# Patient Record
Sex: Male | Born: 1955 | Race: White | Hispanic: No | Smoking: Former smoker
Health system: Southern US, Community
[De-identification: ages and names within clinical notes are randomized; demographics above are authoritative.]

## PROBLEM LIST (undated history)

## (undated) DIAGNOSIS — I2510 Atherosclerotic heart disease of native coronary artery without angina pectoris: Secondary | ICD-10-CM

## (undated) DIAGNOSIS — E875 Hyperkalemia: Secondary | ICD-10-CM

## (undated) DIAGNOSIS — E7800 Pure hypercholesterolemia, unspecified: Secondary | ICD-10-CM

## (undated) DIAGNOSIS — H269 Unspecified cataract: Secondary | ICD-10-CM

## (undated) DIAGNOSIS — L409 Psoriasis, unspecified: Secondary | ICD-10-CM

## (undated) DIAGNOSIS — I10 Essential (primary) hypertension: Secondary | ICD-10-CM

## (undated) DIAGNOSIS — K219 Gastro-esophageal reflux disease without esophagitis: Secondary | ICD-10-CM

## (undated) HISTORY — DX: Hyperkalemia: E87.5

## (undated) HISTORY — DX: Unspecified cataract: H26.9

## (undated) HISTORY — PX: NO PAST SURGERIES: SHX2092

## (undated) HISTORY — PX: WISDOM TOOTH EXTRACTION: SHX21

## (undated) HISTORY — DX: Psoriasis, unspecified: L40.9

## (undated) HISTORY — DX: Gastro-esophageal reflux disease without esophagitis: K21.9

---

## 2002-02-15 HISTORY — PX: COLONOSCOPY: SHX174

## 2008-02-23 ENCOUNTER — Ambulatory Visit (HOSPITAL_COMMUNITY): Admission: RE | Admit: 2008-02-23 | Discharge: 2008-02-23 | Payer: Self-pay | Admitting: Family Medicine

## 2008-03-05 ENCOUNTER — Ambulatory Visit: Payer: Self-pay | Admitting: Cardiology

## 2008-03-13 ENCOUNTER — Encounter: Payer: Self-pay | Admitting: Cardiology

## 2008-03-13 ENCOUNTER — Ambulatory Visit: Payer: Self-pay

## 2008-04-08 ENCOUNTER — Ambulatory Visit: Payer: Self-pay | Admitting: Cardiology

## 2008-06-20 ENCOUNTER — Telehealth: Payer: Self-pay | Admitting: Cardiology

## 2008-06-20 ENCOUNTER — Encounter: Payer: Self-pay | Admitting: Cardiology

## 2008-06-20 DIAGNOSIS — I719 Aortic aneurysm of unspecified site, without rupture: Secondary | ICD-10-CM | POA: Insufficient documentation

## 2008-06-24 ENCOUNTER — Encounter: Admission: RE | Admit: 2008-06-24 | Discharge: 2008-06-24 | Payer: Self-pay | Admitting: Cardiology

## 2010-02-15 HISTORY — PX: RETINAL DETACHMENT SURGERY: SHX105

## 2010-03-19 NOTE — Medication Information (Signed)
Summary: Physician's Orders   Physician's Orders   Imported By: Roderic Ovens 09/02/2009 12:11:16  _____________________________________________________________________  External Attachment:    Type:   Image     Comment:   External Document

## 2010-06-30 NOTE — Assessment & Plan Note (Signed)
[facility]                            CARDIOLOGY OFFICE NOTE   NAME:Roger Powers, Roger Powers                          MRN:          [id number]  DATE:04/08/2008                            DOB:          1955/02/25    I had Mr. Barich come in for a followup today.  We reviewed his  echocardiogram.  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  As a result, he may need a CTA, but I will look and  see with the radiologists as to whether or not this really needs to be  done.  Whether or not they saw enough during this was of question.  In  addition, the patient has mild hypertrophy of focal basal septum.  It  measures 1.9 and the mid septum measures 1.1.  I told him prior to  making any recommendations I would speak with Dr. Nolon Rod at [facility].   Overall, the patient looks good.  His blood pressure is 128/70, pulse is  80.  He does not have a significant murmur noted.  There is no  significant edema.   RECOMMENDATIONS:  1. Probably MRI/MRA or CTA of the thoracic aorta for better      delineation.  2. Review with Dr. Nolon Rod at [facility], need for further GI studies.     Arturo Morton. Riley Kill, MD, [facility]  Electronically Signed    TDS/MedQ  DD: 04/28/2008  DT: 04/29/2008  Job #: [id number]   cc:   Mila Homer. Sudie Bailey, M.D.

## 2010-06-30 NOTE — Assessment & Plan Note (Signed)
Roger Powers                            CARDIOLOGY OFFICE NOTE   NAME:Roger Powers, Roger Powers                          MRN:          [id number]  DATE:03/05/2008                            DOB:          1956-01-04    CHIEF COMPLAINT:  Abnormal CT scan.   HISTORY OF PRESENT ILLNESS:  Roger Powers is a very delightful 55 year old  gentleman, who has been referred for further evaluation.  He is  basically asymptomatic from a cardiac standpoint.  He had some right  flank pain and underwent CT study on February 23, 2008, read by Dr.  Molli Posey.  To summarize, this demonstrated no acute findings in the  abdominal pelvis, no distal urinary stones, and left inguinal hernia  containing only fat.  On one image, there was asymmetric thickening of  the septum myocardium towards the base.  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  Sudie Bailey.  In reviewing the patient's history, he has been asymptomatic  from a cardiac standpoint.  He does pretty much whatever he would like  to do.  For insurance purposes, he had an enhanced stress test  approximately 2 years ago and I believe this may have been done  [facility].  He also had other studies done at an  office.  It sounds like it could have been Dr. Roseanne Kaufman office.  The  patient has not had chest pain, syncope, or presyncope.  He has not had  a clear-cut history of early sudden death.  He provides no history of  significant murmur in the past.   PAST MEDICAL HISTORY:  1. History of mild obesity.  2. History of mild hypercholesterolemia.  3. Reported history of psoriasis.   CURRENT MEDICATIONS:  1. Enalapril 10 mg daily for hypertension.  2. Aspirin 81 mg daily.   ALLERGIES:  No known drug allergies.   FAMILY HISTORY:  His father died at 77.  He had had CABG, PTCA, and a  pacemaker.  His great-great-grandfather and grandfather both died at 26  and  64 of heart disease.  One of them was a smoker and has had a CVA.  His mother died of breast cancer.  He has had a grandmother with cancer  of the brain and a grandfather who had a myocardial infarction.   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  He quit smoking approximately 28 years ago.  He did  this because of his 80 year old son.  He has another son, Roger Powers, who has  hydrocephalous at age 28 and sees Dr. Sharene Skeans.  He has a 30 year old  daughter as well.  He has a brother who is 50 in [place] and in good  health.  He has a son who died.  His nephew has testicular cancer.  He  has a 21 year old brother who had chest pain and died presumably from a  cardiac event.   REVIEW OF SYSTEMS:  His GI is negative.  He did have some flank pain.  HEENT:  Ear, nose, and throat, musculoskeletal review of systems are all  unremarkable.  A complete review of systems is unremarkable.   PHYSICAL EXAMINATION:  GENERAL:  He is alert and oriented in no  distress, weight is 215 pounds, blood pressure 148/100 equal in both  arms, pulse is 84.  NECK:  Carotid upstrokes are brisk without bruit.  LUNGS:  Fields are clear to auscultation and percussion.  The PMI is  nondisplaced.  CARDIAC:  There is normal first and second heart sound.  I did not  appreciate a significant murmur.  We squaded the patient and stood him  back up and did not hear any appreciation of a murmur at this point in  time.  ABDOMEN:  Soft without obvious hepatosplenomegaly.  There were no spider  angiomata.  EXTREMITIES:  Distal extremities were intact without difficult findings.   IMPRESSION:  1. 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  2. No evidence of significant obstruction by examination.   ADDENDUM:  EKG is entirely within normal limits.  PR interval is 142  milliseconds, QRS duration 110, QTC 427 milliseconds.   PLAN:  1. A 2-D echocardiogram will be  obtained to further evaluate this in      detail.  2. If 2-D echocardiogram does suggest significant thickening of the      interventricular septum, then we would recommend an exercise      tolerance test.  3. We will try to get his records from [facility]      to assess his exercise test response.  4. We will return to clinic after these findings.  5. We discussed in detail the genetics, implications, and further      actions required if hypertrophic cardiomyopathy is found.  6. I recommended against extremes of exercise at this point in time      until his echocardiogram is complete.     Arturo Morton. Riley Kill, MD, [facility]  Electronically Signed    TDS/MedQ  DD: 03/06/2008  DT: 03/06/2008  Job #: [id number]   cc:   Mila Homer. Sudie Bailey, M.D.

## 2011-02-16 HISTORY — PX: CATARACT EXTRACTION W/ INTRAOCULAR LENS IMPLANT: SHX1309

## 2011-03-19 ENCOUNTER — Ambulatory Visit: Payer: Self-pay | Admitting: Cardiology

## 2011-04-21 ENCOUNTER — Ambulatory Visit: Payer: Self-pay | Admitting: Cardiology

## 2011-05-07 ENCOUNTER — Ambulatory Visit (HOSPITAL_COMMUNITY): Payer: BC Managed Care – PPO | Attending: Internal Medicine

## 2011-05-07 ENCOUNTER — Other Ambulatory Visit: Payer: Self-pay

## 2011-05-07 ENCOUNTER — Ambulatory Visit (INDEPENDENT_AMBULATORY_CARE_PROVIDER_SITE_OTHER): Payer: Self-pay | Admitting: Cardiology

## 2011-05-07 ENCOUNTER — Encounter: Payer: Self-pay | Admitting: Cardiology

## 2011-05-07 VITALS — BP 160/90 | HR 61 | Ht 71.0 in | Wt 201.0 lb

## 2011-05-07 DIAGNOSIS — I10 Essential (primary) hypertension: Secondary | ICD-10-CM

## 2011-05-07 DIAGNOSIS — I77819 Aortic ectasia, unspecified site: Secondary | ICD-10-CM

## 2011-05-07 DIAGNOSIS — I77810 Thoracic aortic ectasia: Secondary | ICD-10-CM

## 2011-05-07 DIAGNOSIS — R931 Abnormal findings on diagnostic imaging of heart and coronary circulation: Secondary | ICD-10-CM

## 2011-05-07 DIAGNOSIS — R9389 Abnormal findings on diagnostic imaging of other specified body structures: Secondary | ICD-10-CM

## 2011-05-07 DIAGNOSIS — I7789 Other specified disorders of arteries and arterioles: Secondary | ICD-10-CM

## 2011-05-07 NOTE — Patient Instructions (Signed)
Your physician has requested that you have an echocardiogram. Echocardiography is a painless test that uses sound waves to create images of your heart. It provides your doctor with information about the size and shape of your heart and how well your heart's chambers and valves are working. This procedure takes approximately one hour. There are no restrictions for this procedure.  Your physician recommends that you continue on your current medications as directed. Please refer to the Current Medication list given to you today.  

## 2011-05-10 ENCOUNTER — Encounter (HOSPITAL_COMMUNITY): Payer: Self-pay | Admitting: Cardiology

## 2011-05-20 ENCOUNTER — Telehealth: Payer: Self-pay | Admitting: Cardiology

## 2011-05-20 NOTE — Telephone Encounter (Signed)
Dr Riley Kill aware pt called and will contact pt to discuss echo results.

## 2011-05-20 NOTE — Telephone Encounter (Signed)
Pt had test done 05-07-11 calling for results

## 2011-05-22 NOTE — Progress Notes (Signed)
   HPI:  He is generally getting along well.  It has been some time since I saw him last.  He had upper septal thickening, and no other phenotypic expression to suggest HCM.  He gets along well.  No major problems.  Denies chest pain, or other symptoms.  On his last evaluation, he had an echo and an MRA to assess his aorta because there was mild dilatation.  His MRA suggested an aortic root in the range of 4.1 cm.  There was a statistically benign liver lesion.  We reviewed his family history today----his father died at 47 and had CABG and a pacemaker.  But there is no family his of SCD importantly.    Current Outpatient Prescriptions  Medication Sig Dispense Refill  . aspirin 81 MG tablet Take 81 mg by mouth daily.      . diazepam (VALIUM) 10 MG tablet Take 10 mg by mouth as needed.       . enalapril (VASOTEC) 10 MG tablet Take 10 mg by mouth daily.      Marland Kitchen zolpidem (AMBIEN) 10 MG tablet Take 10 mg by mouth as needed.         No Known Allergies  No past medical history on file.  No past surgical history on file.  No family history on file.  History   Social History  . Marital Status: Married    Spouse Name: N/A    Number of Children: N/A  . Years of Education: N/A   Occupational History  . Not on file.   Social History Main Topics  . Smoking status: Former Smoker    Quit date: 11/18/1979  . Smokeless tobacco: Not on file  . Alcohol Use: Not on file  . Drug Use: Not on file  . Sexually Active: Not on file   Other Topics Concern  . Not on file   Social History Narrative  . No narrative on file    ROS: Please see the HPI.  All other systems reviewed and negative.  PHYSICAL EXAM:  BP 160/90  Pulse 61  Ht 5\' 11"  (1.803 m)  Wt 201 lb (91.173 kg)  BMI 28.03 kg/m2  General: Well developed, well nourished, in no acute distress. Head:  Normocephalic and atraumatic. Neck: no JVD Lungs: Clear to auscultation and percussion. Heart: Normal S1 and S2.  No murmur, rubs or  gallops.  Abdomen:  Normal bowel sounds; soft; non tender; no organomegaly Pulses: Pulses normal in all 4 extremities. Extremities: No clubbing or cyanosis. No edema. Neurologic: Alert and oriented x 3.  EKG:  NSR.  Incomplete RBBB.  No acute changes.    ASSESSMENT AND PLAN:

## 2011-05-23 DIAGNOSIS — I7789 Other specified disorders of arteries and arterioles: Secondary | ICD-10-CM | POA: Insufficient documentation

## 2011-05-23 DIAGNOSIS — I10 Essential (primary) hypertension: Secondary | ICD-10-CM | POA: Insufficient documentation

## 2011-05-23 DIAGNOSIS — R931 Abnormal findings on diagnostic imaging of heart and coronary circulation: Secondary | ICD-10-CM | POA: Insufficient documentation

## 2011-05-23 NOTE — Assessment & Plan Note (Signed)
Was not significant enlarged.  Will repeat echocardiogram.

## 2011-05-23 NOTE — Assessment & Plan Note (Signed)
He and I reviewed the meaning by DST.  It is at most moderate, and there is no significant obstruction.  It is unchanged, and does not clearly qualify as HCM  (prob less than 10% genotypic expression--case previously discussed with Dr. Regino Schultze).  No significant murmur or symptoms.  Would simply repeat echo and compare to prior study.  No further evaluation at this time.

## 2011-05-23 NOTE — Assessment & Plan Note (Signed)
Noted on physical exam and also today in office.  Would benefit from weight loss, then reassessment of his situation with Dr. Sudie Bailey.  May need treatment.

## 2011-05-27 NOTE — Progress Notes (Signed)
Addended by: Reine Just on: 05/27/2011 09:58 AM   Modules accepted: Orders

## 2012-03-21 ENCOUNTER — Telehealth: Payer: Self-pay | Admitting: Cardiology

## 2012-03-21 NOTE — Telephone Encounter (Signed)
Spoke with christina at dr Viviann Spare knowlton's office. The pt was seen in their office today and had told them dr Riley Kill had found a problem with his liver. They requested the last office note and echo faxed to [phone]. Requested records faxed to the number provided.

## 2012-03-21 NOTE — Telephone Encounter (Signed)
New problem:    Patient stating he has a liver problem. Office calling to discuss.

## 2012-04-04 ENCOUNTER — Other Ambulatory Visit (HOSPITAL_COMMUNITY): Payer: Self-pay | Admitting: Family Medicine

## 2012-04-04 ENCOUNTER — Ambulatory Visit (HOSPITAL_COMMUNITY)
Admission: RE | Admit: 2012-04-04 | Discharge: 2012-04-04 | Disposition: A | Discharge status: Home / Self Care | Payer: BC Managed Care – PPO | Source: Ambulatory Visit | Attending: Family Medicine | Admitting: Family Medicine

## 2012-04-04 DIAGNOSIS — R0781 Pleurodynia: Secondary | ICD-10-CM

## 2012-04-04 DIAGNOSIS — R079 Chest pain, unspecified: Secondary | ICD-10-CM | POA: Insufficient documentation

## 2013-08-25 ENCOUNTER — Encounter (HOSPITAL_COMMUNITY): Payer: Self-pay | Admitting: Emergency Medicine

## 2013-08-25 ENCOUNTER — Emergency Department (HOSPITAL_COMMUNITY)
Admission: EM | Admit: 2013-08-25 | Discharge: 2013-08-25 | Disposition: A | Discharge status: Home / Self Care | Payer: BC Managed Care – PPO | Attending: Emergency Medicine | Admitting: Emergency Medicine

## 2013-08-25 DIAGNOSIS — Z79899 Other long term (current) drug therapy: Secondary | ICD-10-CM | POA: Insufficient documentation

## 2013-08-25 DIAGNOSIS — S058X9A Other injuries of unspecified eye and orbit, initial encounter: Secondary | ICD-10-CM | POA: Insufficient documentation

## 2013-08-25 DIAGNOSIS — S0502XA Injury of conjunctiva and corneal abrasion without foreign body, left eye, initial encounter: Secondary | ICD-10-CM

## 2013-08-25 DIAGNOSIS — T1590XA Foreign body on external eye, part unspecified, unspecified eye, initial encounter: Secondary | ICD-10-CM | POA: Insufficient documentation

## 2013-08-25 DIAGNOSIS — Z87891 Personal history of nicotine dependence: Secondary | ICD-10-CM | POA: Insufficient documentation

## 2013-08-25 DIAGNOSIS — T1592XA Foreign body on external eye, part unspecified, left eye, initial encounter: Secondary | ICD-10-CM

## 2013-08-25 DIAGNOSIS — Z7982 Long term (current) use of aspirin: Secondary | ICD-10-CM | POA: Insufficient documentation

## 2013-08-25 DIAGNOSIS — Y939 Activity, unspecified: Secondary | ICD-10-CM | POA: Insufficient documentation

## 2013-08-25 DIAGNOSIS — Y929 Unspecified place or not applicable: Secondary | ICD-10-CM | POA: Insufficient documentation

## 2013-08-25 DIAGNOSIS — T1500XA Foreign body in cornea, unspecified eye, initial encounter: Secondary | ICD-10-CM | POA: Insufficient documentation

## 2013-08-25 MED ORDER — TETRACAINE HCL 0.5 % OP SOLN
1.0000 [drp] | Freq: Once | OPHTHALMIC | Status: AC
  Administered 2013-08-25: 1 [drp] via OPHTHALMIC
  Filled 2013-08-25: qty 2

## 2013-08-25 MED ORDER — FLUORESCEIN SODIUM 1 MG OP STRP
1.0000 | ORAL_STRIP | Freq: Once | OPHTHALMIC | Status: AC
  Administered 2013-08-25: 1 via OPHTHALMIC
  Filled 2013-08-25: qty 1

## 2013-08-25 MED ORDER — HYDROCODONE-ACETAMINOPHEN 5-325 MG PO TABS: 1.0000 | ORAL_TABLET | ORAL | Status: DC | PRN

## 2013-08-25 MED ORDER — CIPROFLOXACIN HCL 0.3 % OP SOLN: 1.0000 [drp] | Freq: Four times a day (QID) | OPHTHALMIC | Status: DC

## 2013-08-25 NOTE — ED Notes (Signed)
Declined W/C at D/C and was escorted to lobby by RN. 

## 2013-08-25 NOTE — ED Notes (Signed)
Reports it feels like object is in left eye since last night. Having redness and pain. No vision changes.

## 2013-08-25 NOTE — ED Provider Notes (Signed)
CSN: [id number]     Arrival date & time 08/25/13  1449 History  This chart was scribed for non-physician practitioner working with Roger Essex, MD by Mercy Moore, ED Scribe. This patient was seen in room TR04C/TR04C and the patient's care was started at 4:08 PM.   Chief Complaint  Patient presents with  . Eye Pain      The history is provided by the patient. No language interpreter was used.   HPI Comments: Roger Powers is a 58 y.o. male who presents to the Emergency Department complaining of worsening left eye pain, onset last night while sleeping. Patient describes sensation of foreign body. Thus he has treated eye with saline and flushing, but denies relief. Patient reports that his pain is exacerbated with blinking; patient states that it feels as if a razor blade is in his eye. Patient denies discharge and blurred vision, but reports tearing and some consequential rhinorrhea. Patient works in [employer]. 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 Patient denies recent chemical exposures. Patient does not wear corrective lens.    History reviewed. No pertinent past medical history. History reviewed. No pertinent past surgical history. History reviewed. No pertinent family history. History  Substance Use Topics  . Smoking status: Former Smoker    Quit date: 11/18/1979  . Smokeless tobacco: Not on file  . Alcohol Use: No    Review of Systems  Constitutional: Negative for fever and chills.  HENT: Positive for rhinorrhea.   Eyes: Positive for redness. Negative for photophobia, discharge and visual disturbance.  Neurological: Negative for headaches.  All other systems reviewed and are negative.     Allergies  Review of patient's allergies indicates no known allergies.  Home Medications   Prior to Admission medications   Medication Sig Start Date End Date Taking? Authorizing Provider  aspirin 81 MG tablet Take 81 mg by mouth  daily.    Historical Provider, MD  diazepam (VALIUM) 10 MG tablet Take 10 mg by mouth as needed.  03/04/11   Historical Provider, MD  enalapril (VASOTEC) 10 MG tablet Take 10 mg by mouth daily.    Historical Provider, MD  zolpidem (AMBIEN) 10 MG tablet Take 10 mg by mouth as needed.  04/27/11   Historical Provider, MD   Triage Vitals: BP 140/76  Pulse 63  Temp(Src) 98.3 F (36.8 C)  Resp 18  SpO2 96% Physical Exam  Nursing note and vitals reviewed. Constitutional: He appears well-developed and well-nourished. No distress.  HENT:  Head: Normocephalic and atraumatic.  Eyes: Conjunctivae and EOM are normal. Pupils are equal, round, and reactive to light. Right eye exhibits no discharge. Left eye exhibits no discharge. Foreign body (small thin white hair removed) present in the left eye. No scleral icterus.    Neck: Neck supple.  Pulmonary/Chest: Effort normal.  Neurological: He is alert.  Skin: He is not diaphoretic.    ED Course  FOREIGN BODY REMOVAL Date/Time: 08/25/2013 5:57 PM Performed by: Clayton Bibles Authorized by: Clayton Bibles Consent: Verbal consent obtained. Consent given by: patient Patient identity confirmed: verbally with patient Body area: eye Location details: left conjunctiva Local anesthetic: topical anesthetic Patient sedated: no Patient restrained: no Patient cooperative: yes Localization method: visualized Removal mechanism: moist cotton swab Eye examined with fluorescein. Corneal abrasion size: medium No residual rust ring present. Dressing: antibiotic drops Depth: superficial Complexity: simple 1 objects recovered. Objects recovered: thin thread or hair Post-procedure assessment: foreign body removed Patient tolerance: Patient tolerated the procedure  well with no immediate complications.   (including critical care time) COORDINATION OF CARE: 4:10 PM- Discussed treatment plan with patient at bedside and patient agreed to plan.   Labs Review Labs  Reviewed - No data to display  Imaging Review No results found.   EKG Interpretation None      MDM   Final diagnoses:  Corneal abrasion, left, initial encounter    Pt with left eye injection and FB sensation that began yesterday.  Pt works in [employer].  Very small thin hair removed.  Corneal abrasion present.  Pt has corneal abrasion. D/C home with cipro drops, norco, and ophthalmology follow up.  Discussed findings, treatment, and follow up  with patient.  Pt given return precautions.  Pt verbalizes understanding and agrees with plan.      I personally performed the services described in this documentation, which was scribed in my presence. The recorded information has been reviewed and is accurate.    Clayton Bibles, PA-C 08/25/13 [place], PA-C 08/25/13 1759

## 2013-08-25 NOTE — Discharge Instructions (Signed)
Read the information below.  Use the prescribed medication as directed.  Please discuss all new medications with your pharmacist.  Do not take additional tylenol while taking the prescribed pain medication to avoid overdose.  You may return to the Emergency Department at any time for worsening condition or any new symptoms that concern you.   If you develop worsening pain in your eye, change in your vision, swelling around your eye, difficulty moving your eye, or fevers greater than 100.4, see your eye doctor or return to the Emergency Department immediately for a recheck.    ° ° °Corneal Abrasion °The cornea is the clear covering at the front and center of the eye. When looking at the colored portion of the eye (iris), you are looking through the cornea. This very thin tissue is made up of many layers. The surface layer is a single layer of cells (corneal epithelium) and is one of the most sensitive tissues in the body. If a scratch or injury causes the corneal epithelium to come off, it is called a corneal abrasion. If the injury extends to the tissues below the epithelium, the condition is called a corneal ulcer. °CAUSES  °· Scratches. °· Trauma. °· Foreign body in the eye. °Some people have recurrences of abrasions in the area of the original injury even after it has healed (recurrent erosion syndrome). Recurrent erosion syndrome generally improves and goes away with time. °SYMPTOMS  °· Eye pain. °· Difficulty or inability to keep the injured eye open. °· The eye becomes very sensitive to light. °· Recurrent erosions tend to happen suddenly, first thing in the morning, usually after waking up and opening the eye. °DIAGNOSIS  °Your health care provider can diagnose a corneal abrasion during an eye exam. Dye is usually placed in the eye using a drop or a small paper strip moistened by your tears. When the eye is examined with a special light, the abrasion shows up clearly because of the dye. °TREATMENT  °· Small  abrasions may be treated with antibiotic drops or ointment alone. °· A pressure patch may be put over the eye. If this is done, follow your doctor's instructions for when to remove the patch. Do not drive or use machines while the eye patch is on. Judging distances is hard to do with a patch on. °If the abrasion becomes infected and spreads to the deeper tissues of the cornea, a corneal ulcer can result. This is serious because it can cause corneal scarring. Corneal scars interfere with light passing through the cornea and cause a loss of vision in the involved eye. °HOME CARE INSTRUCTIONS °· Use medicine or ointment as directed. Only take over-the-counter or prescription medicines for pain, discomfort, or fever as directed by your health care provider. °· Do not drive or operate machinery if your eye is patched. Your ability to judge distances is impaired. °· If your health care provider has given you a follow-up appointment, it is very important to keep that appointment. Not keeping the appointment could result in a severe eye infection or permanent loss of vision. If there is any problem keeping the appointment, let your health care provider know. °SEEK MEDICAL CARE IF:  °· You have pain, light sensitivity, and a scratchy feeling in one eye or both eyes. °· Your pressure patch keeps loosening up, and you can blink your eye under the patch after treatment. °· Any kind of discharge develops from the eye after treatment or if the lids stick together   in the morning. °· You have the same symptoms in the morning as you did with the original abrasion days, weeks, or months after the abrasion healed. °MAKE SURE YOU:  °· Understand these instructions. °· Will watch your condition. °· Will get help right away if you are not doing well or get worse. °Document Released: 01/30/2000 Document Revised: 02/06/2013 Document Reviewed: 10/09/2012 °[employer]® Patient Information ©2015 [employer]. This information is not intended to  replace advice given to you by your health care provider. Make sure you discuss any questions you have with your health care provider. ° °

## 2013-08-26 NOTE — ED Provider Notes (Signed)
Medical screening examination/treatment/procedure(s) were performed by non-physician practitioner and as supervising physician I was immediately available for consultation/collaboration.   EKG Interpretation None        [name], MD 08/26/13 0020

## 2016-08-24 ENCOUNTER — Ambulatory Visit (HOSPITAL_COMMUNITY)
Admission: RE | Admit: 2016-08-24 | Discharge: 2016-08-24 | Disposition: A | Discharge status: Home / Self Care | Payer: PRIVATE HEALTH INSURANCE | Source: Ambulatory Visit | Attending: Family Medicine | Admitting: Family Medicine

## 2016-08-24 ENCOUNTER — Other Ambulatory Visit (HOSPITAL_COMMUNITY): Payer: Self-pay

## 2016-08-24 ENCOUNTER — Other Ambulatory Visit (HOSPITAL_COMMUNITY): Payer: Self-pay | Admitting: Family Medicine

## 2016-08-24 DIAGNOSIS — R0789 Other chest pain: Secondary | ICD-10-CM | POA: Insufficient documentation

## 2016-08-24 DIAGNOSIS — I340 Nonrheumatic mitral (valve) insufficiency: Secondary | ICD-10-CM | POA: Insufficient documentation

## 2016-08-24 DIAGNOSIS — R079 Chest pain, unspecified: Secondary | ICD-10-CM

## 2016-08-24 DIAGNOSIS — I10 Essential (primary) hypertension: Secondary | ICD-10-CM | POA: Diagnosis not present

## 2016-08-24 LAB — ECHOCARDIOGRAM COMPLETE
CHL CUP MV DEC (S): 278
CHL CUP STROKE VOLUME: 45 mL
E/e' ratio: 9.05
EWDT: 278 ms
FS: 38 % (ref 28–44)
IV/PV OW: 1.26
LA diam end sys: 41 mm
LA diam index: 1.9 cm/m2
LA vol index: 17.1 mL/m2
LA vol: 36.8 mL
LASIZE: 41 mm
LAVOLA4C: 39.8 mL
LDCA: 3.8 cm2
LV E/e' medial: 9.05
LV E/e'average: 9.05
LV TDI E'LATERAL: 9.03
LV sys vol index: 13 mL/m2
LV sys vol: 27 mL
LVDIAVOL: 72 mL (ref 62–150)
LVDIAVOLIN: 34 mL/m2
LVELAT: 9.03 cm/s
LVOT SV: 120 mL
LVOT VTI: 31.6 cm
LVOT diameter: 22 mm
LVOT peak grad rest: 11 mmHg
LVOT peak vel: 168 cm/s
MV Peak grad: 3 mmHg
MV pk A vel: 92.9 m/s
MV pk E vel: 81.7 m/s
PW: 11.2 mm — AB (ref 0.6–1.1)
RV LATERAL S' VELOCITY: 14.5 cm/s
Simpson's disk: 62
TAPSE: 20.4 mm
TDI e' medial: 7.18

## 2016-08-24 NOTE — Progress Notes (Signed)
*  PRELIMINARY RESULTS* Echocardiogram 2D Echocardiogram has been performed.  Roger Powers 08/24/2016, 3:09 PM

## 2016-08-29 NOTE — Progress Notes (Signed)
Cardiology Office Note   Date:  08/30/2016   ID:  FRENCHIE DANGERFIELD, DOB 12-10-1955, MRN [id number]  PCP:  Lemmie Evens, MD  Cardiologist:   Dorris Carnes, MD   Pt referred by Vickey Sages for CP    History of Present Illness: Roger Powers is a 61 y.o. male with a history of  chst pain  For few wks  WOrse at night when lays in bed  SOme during the day  With activity he says it is better   Has has some SOB with activity but no CP    Pt reports he has had reflux  Treated for bacteria   Also notes recently a change in bowel movements  Very dark    Echo on 7/1 showed LVEF normal  Mild MR  Paternal side of family brother, fther GF died of MI    Current Meds  Medication Sig  . aspirin 81 MG tablet Take 81 mg by mouth daily.  . diazepam (VALIUM) 10 MG tablet Take 10 mg by mouth every 8 (eight) hours as needed for anxiety.  Marland Kitchen lisinopril (PRINIVIL,ZESTRIL) 10 MG tablet Take 10 mg by mouth daily.  Marland Kitchen zolpidem (AMBIEN) 10 MG tablet Take 10 mg by mouth at bedtime as needed for sleep.      Allergies:   Patient has no known allergies.   Past Medical History:  Diagnosis Date  . Psoriasis     No past surgical history on file.   Social History:  The patient  reports that he quit smoking about 36 years ago. He has quit using smokeless tobacco. He reports that he does not drink alcohol or use drugs.   Family History:  The patient's family history includes Cancer in his mother; Diabetes in his mother; Heart disease in his brother and father.    ROS:  Please see the history of present illness. All other systems are reviewed and  Negative to the above problem except as noted.    PHYSICAL EXAM: VS:  BP (!) 168/110   Pulse 74   Ht 5\' 11"  (1.803 m)   Wt 216 lb (98 kg)   SpO2 97%   BMI 30.13 kg/m   GEN: Well nourished, well developed, in no acute distress  HEENT: normal  Neck: no JVD, carotid bruits, or masses Cardiac: RRR; no murmurs, rubs, or gallops,no edema  Respiratory:  clear to  auscultation bilaterally, normal work of breathing GI: soft, nontender, nondistended, + BS  No hepatomegaly  MS: no deformity Moving all extremities   Skin: warm and dry, no rash Neuro:  Strength and sensation are intact Psych: euthymic mood, full affect   EKG:  EKG is ordered today.  SR 71 bpm  RBBB   Lipid Panel No results found for: CHOL, TRIG, HDL, CHOLHDL, VLDL, LDLCALC, LDLDIRECT    Wt Readings from Last 3 Encounters:  08/30/16 216 lb (98 kg)  05/07/11 201 lb (91.2 kg)      ASSESSMENT AND PLAN:  1  CP  Pain does not sound like coronary ischemia  Sounds more GI I would recomm Protonix daily Also does have dyspnea  Again, I am not convinced anginal equiv but would recomm regular GXT to look at duration, training, BP respoinse  2  HTN  Take BP meds  Follow at heom  3  FHx CAD  Both brother and father died of MIs  (note he had been seen by Lowella Dell in past for ? HCM   I  am not convinced he has this  On review of echo)  With [place]  I would recomm Ca score CT  This will also look at aorta    F?U based on test results      Current medicines are reviewed at length with the patient today.  The patient does not have concerns regarding medicines.  Signed, Dorris Carnes, MD  08/30/2016 11:09 AM    [facility] [place], [place], [place]  [address] Phone: [phone]; Fax: [phone]

## 2016-08-30 ENCOUNTER — Encounter: Payer: Self-pay | Admitting: Internal Medicine

## 2016-08-30 ENCOUNTER — Ambulatory Visit (INDEPENDENT_AMBULATORY_CARE_PROVIDER_SITE_OTHER): Payer: PRIVATE HEALTH INSURANCE | Admitting: Internal Medicine

## 2016-08-30 VITALS — BP 168/110 | HR 74 | Ht 71.0 in | Wt 216.0 lb

## 2016-08-30 DIAGNOSIS — R0789 Other chest pain: Secondary | ICD-10-CM

## 2016-08-30 DIAGNOSIS — R0602 Shortness of breath: Secondary | ICD-10-CM

## 2016-08-30 DIAGNOSIS — I10 Essential (primary) hypertension: Secondary | ICD-10-CM | POA: Diagnosis not present

## 2016-08-30 DIAGNOSIS — D508 Other iron deficiency anemias: Secondary | ICD-10-CM

## 2016-08-30 LAB — CBC WITH DIFFERENTIAL/PLATELET
BASOS ABS: 78 {cells}/uL (ref 0–200)
Basophils Relative: 1 %
EOS PCT: 3 %
Eosinophils Absolute: 234 cells/uL (ref 15–500)
HEMATOCRIT: 46 % (ref 38.5–50.0)
Hemoglobin: 15.3 g/dL (ref 13.2–17.1)
LYMPHS PCT: 31 %
Lymphs Abs: 2418 cells/uL (ref 850–3900)
MCH: 31.7 pg (ref 27.0–33.0)
MCHC: 33.3 g/dL (ref 32.0–36.0)
MCV: 95.2 fL (ref 80.0–100.0)
MPV: 10.7 fL (ref 7.5–12.5)
Monocytes Absolute: 702 cells/uL (ref 200–950)
Monocytes Relative: 9 %
NEUTROS PCT: 56 %
Neutro Abs: 4368 cells/uL (ref 1500–7800)
Platelets: 301 10*3/uL (ref 140–400)
RBC: 4.83 MIL/uL (ref 4.20–5.80)
RDW: 13.8 % (ref 11.0–15.0)
WBC: 7.8 10*3/uL (ref 3.8–10.8)

## 2016-08-30 MED ORDER — PANTOPRAZOLE SODIUM 40 MG PO TBEC: 40.0000 mg | DELAYED_RELEASE_TABLET | Freq: Every day | ORAL | 3 refills | Status: DC

## 2016-08-30 NOTE — Patient Instructions (Addendum)
Your physician recommends that you schedule a follow-up appointment depending on test results.   Your physician has recommended you make the following change in your medication: Start Protonix 40 mg Daily   Your physician has requested that you have an exercise tolerance test. For further information please visit [email or web address]. Please also follow instruction sheet, as given.  Your physician recommends that you return for lab work today.   If you need a refill on your cardiac medications before your next appointment, please call your pharmacy.  Thank you for choosing [facility]!

## 2016-09-02 ENCOUNTER — Ambulatory Visit (INDEPENDENT_AMBULATORY_CARE_PROVIDER_SITE_OTHER)
Admission: RE | Admit: 2016-09-02 | Discharge: 2016-09-02 | Disposition: A | Discharge status: Home / Self Care | Payer: Self-pay | Source: Ambulatory Visit | Attending: Internal Medicine | Admitting: Internal Medicine

## 2016-09-02 DIAGNOSIS — R0602 Shortness of breath: Secondary | ICD-10-CM

## 2016-09-06 ENCOUNTER — Inpatient Hospital Stay: Admission: RE | Admit: 2016-09-06 | Payer: PRIVATE HEALTH INSURANCE | Source: Ambulatory Visit

## 2016-09-06 ENCOUNTER — Ambulatory Visit (HOSPITAL_COMMUNITY): Admission: RE | Admit: 2016-09-06 | Payer: PRIVATE HEALTH INSURANCE | Source: Ambulatory Visit

## 2016-09-06 ENCOUNTER — Telehealth: Payer: Self-pay

## 2016-09-06 DIAGNOSIS — I2510 Atherosclerotic heart disease of native coronary artery without angina pectoris: Secondary | ICD-10-CM

## 2016-09-06 NOTE — Telephone Encounter (Signed)
-----   Message from [name], MD sent at 09/05/2016  5:32 PM EDT ----- Reviewed CT findings with pt  1  Aorta is mildly dilated  WIll need to be followed 2  Plaquing of coronary arteries noted  Pt says he gets short of breath with exertion  Would recomm:  Cancel stress test Would recomm : CBC (r/o anemia), BMET and INR  WOuld alos check lipids  Will need to have tight control Consider cath vs CT   Pt aware  Discussed on phone

## 2016-09-06 NOTE — Telephone Encounter (Signed)
Pt will have labs drawn tomorrow, stress test cancelled

## 2016-09-07 ENCOUNTER — Other Ambulatory Visit (HOSPITAL_COMMUNITY)
Admission: RE | Admit: 2016-09-07 | Discharge: 2016-09-07 | Disposition: A | Discharge status: Home / Self Care | Payer: PRIVATE HEALTH INSURANCE | Source: Ambulatory Visit | Attending: Internal Medicine | Admitting: Internal Medicine

## 2016-09-07 DIAGNOSIS — I2510 Atherosclerotic heart disease of native coronary artery without angina pectoris: Secondary | ICD-10-CM | POA: Diagnosis not present

## 2016-09-07 LAB — LIPID PANEL
Cholesterol: 222 mg/dL — ABNORMAL HIGH (ref 0–200)
HDL: 32 mg/dL — ABNORMAL LOW (ref >40)
LDL CALC: 135 mg/dL — AB (ref 0–99)
TRIGLYCERIDES: 274 mg/dL — AB (ref <150)
Total CHOL/HDL Ratio: 6.9 RATIO
VLDL: 55 mg/dL — AB (ref 0–40)

## 2016-09-07 LAB — CBC
HEMATOCRIT: 48.3 % (ref 39.0–52.0)
HEMOGLOBIN: 16.7 g/dL (ref 13.0–17.0)
MCH: 32.2 pg (ref 26.0–34.0)
MCHC: 34.6 g/dL (ref 30.0–36.0)
MCV: 93.1 fL (ref 78.0–100.0)
Platelets: 266 10*3/uL (ref 150–400)
RBC: 5.19 MIL/uL (ref 4.22–5.81)
RDW: 13.4 % (ref 11.5–15.5)
WBC: 7.5 10*3/uL (ref 4.0–10.5)

## 2016-09-07 LAB — PROTIME-INR
INR: 0.94
PROTHROMBIN TIME: 12.6 s (ref 11.4–15.2)

## 2016-09-07 LAB — BASIC METABOLIC PANEL
ANION GAP: 7 (ref 5–15)
BUN: 11 mg/dL (ref 6–20)
CALCIUM: 9.4 mg/dL (ref 8.9–10.3)
CO2: 29 mmol/L (ref 22–32)
Chloride: 104 mmol/L (ref 101–111)
Creatinine, Ser: 1.08 mg/dL (ref 0.61–1.24)
GFR calc Af Amer: >60 mL/min (ref >60)
GLUCOSE: 122 mg/dL — AB (ref 65–99)
POTASSIUM: 5.3 mmol/L — AB (ref 3.5–5.1)
SODIUM: 140 mmol/L (ref 135–145)

## 2016-09-08 ENCOUNTER — Telehealth: Payer: Self-pay

## 2016-09-08 DIAGNOSIS — E785 Hyperlipidemia, unspecified: Secondary | ICD-10-CM

## 2016-09-08 DIAGNOSIS — I10 Essential (primary) hypertension: Secondary | ICD-10-CM

## 2016-09-08 MED ORDER — HYDROCHLOROTHIAZIDE 12.5 MG PO CAPS: 12.5000 mg | ORAL_CAPSULE | Freq: Every day | ORAL | 3 refills | Status: DC

## 2016-09-08 MED ORDER — ROSUVASTATIN CALCIUM 20 MG PO TABS: 20.0000 mg | ORAL_TABLET | Freq: Every day | ORAL | 3 refills | Status: DC

## 2016-09-08 NOTE — Telephone Encounter (Signed)
Spoke with pt. Informed him of lab results and medication changes.( Add crestor 20 mg, HCTZ 12.5 MG ) and to repeat labs in 3/8 weeks. Pt voiced understanding. I will mail labs slips and a copy of labs to pt.

## 2016-09-08 NOTE — Telephone Encounter (Signed)
-----   Message from [name], MD sent at 09/08/2016  8:17 AM EDT ----- Lipids are high  Need to be lower with CAD on CT scan   REcomm Crestor 20 mg  F/U lipid panel in 8 wks with AST  Kidney function normal  Potassium slightly high  With BP high I would recomm adding 12.5 HCTZ to regimen  Follow BP in 3 wks  Get BMET at that time

## 2016-09-28 ENCOUNTER — Ambulatory Visit (INDEPENDENT_AMBULATORY_CARE_PROVIDER_SITE_OTHER): Payer: PRIVATE HEALTH INSURANCE | Admitting: *Deleted

## 2016-09-28 VITALS — BP 124/72 | HR 70

## 2016-09-28 DIAGNOSIS — I10 Essential (primary) hypertension: Secondary | ICD-10-CM

## 2016-09-28 NOTE — Patient Instructions (Signed)
Medication Instructions:  Your physician recommends that you continue on your current medications as directed. Please refer to the Current Medication list given to you today.   Labwork: NONE  Testing/Procedures: NONE  Follow-Up: As stated before with Dr. Harrington Challenger   Any Other Special Instructions Will Be Listed Below (If Applicable).     If you need a refill on your cardiac medications before your next appointment, please call your pharmacy.

## 2016-09-28 NOTE — Progress Notes (Signed)
Patient states that HCTZ gave him leg cramps at first. He states that the cramps have since stopped. He does c/o dull headache now. Rates headache 2/10. Patient does not have any other problems at this time. Please advise.

## 2016-09-29 ENCOUNTER — Other Ambulatory Visit: Payer: Self-pay | Admitting: Internal Medicine

## 2016-09-29 LAB — BASIC METABOLIC PANEL
BUN: 19 mg/dL (ref 7–25)
CALCIUM: 9.4 mg/dL (ref 8.6–10.3)
CO2: 28 mmol/L (ref 20–32)
Chloride: 102 mmol/L (ref 98–110)
Creat: 1.02 mg/dL (ref 0.70–1.25)
GLUCOSE: 100 mg/dL — AB (ref 65–99)
Potassium: 4.8 mmol/L (ref 3.5–5.3)
SODIUM: 139 mmol/L (ref 135–146)

## 2016-09-29 LAB — LIPID PANEL
CHOL/HDL RATIO: 5.2 ratio — AB (ref <5.0)
CHOLESTEROL: 134 mg/dL (ref <200)
HDL: 26 mg/dL — ABNORMAL LOW (ref >40)
LDL Cholesterol: 71 mg/dL (ref <100)
Triglycerides: 185 mg/dL — ABNORMAL HIGH (ref <150)
VLDL: 37 mg/dL — AB (ref <30)

## 2016-10-15 ENCOUNTER — Encounter: Payer: Self-pay | Admitting: *Deleted

## 2016-11-08 ENCOUNTER — Encounter: Payer: Self-pay | Admitting: Internal Medicine

## 2016-11-08 ENCOUNTER — Ambulatory Visit (INDEPENDENT_AMBULATORY_CARE_PROVIDER_SITE_OTHER): Payer: PRIVATE HEALTH INSURANCE | Admitting: Internal Medicine

## 2016-11-08 VITALS — BP 140/70 | HR 71 | Ht 71.0 in | Wt 210.8 lb

## 2016-11-08 DIAGNOSIS — R079 Chest pain, unspecified: Secondary | ICD-10-CM | POA: Diagnosis not present

## 2016-11-08 DIAGNOSIS — R931 Abnormal findings on diagnostic imaging of heart and coronary circulation: Secondary | ICD-10-CM

## 2016-11-08 DIAGNOSIS — I10 Essential (primary) hypertension: Secondary | ICD-10-CM | POA: Diagnosis not present

## 2016-11-08 DIAGNOSIS — E785 Hyperlipidemia, unspecified: Secondary | ICD-10-CM | POA: Diagnosis not present

## 2016-11-08 DIAGNOSIS — I2510 Atherosclerotic heart disease of native coronary artery without angina pectoris: Secondary | ICD-10-CM

## 2016-11-08 DIAGNOSIS — R0602 Shortness of breath: Secondary | ICD-10-CM | POA: Diagnosis not present

## 2016-11-08 LAB — BASIC METABOLIC PANEL
BUN/Creatinine Ratio: 16 (ref 10–24)
BUN: 18 mg/dL (ref 8–27)
CALCIUM: 9.3 mg/dL (ref 8.6–10.2)
CHLORIDE: 104 mmol/L (ref 96–106)
CO2: 23 mmol/L (ref 20–29)
CREATININE: 1.1 mg/dL (ref 0.76–1.27)
GFR calc Af Amer: 82 mL/min/{1.73_m2} (ref >59)
GFR calc non Af Amer: 71 mL/min/{1.73_m2} (ref >59)
GLUCOSE: 103 mg/dL — AB (ref 65–99)
Potassium: 4.6 mmol/L (ref 3.5–5.2)
Sodium: 141 mmol/L (ref 134–144)

## 2016-11-08 NOTE — Progress Notes (Signed)
Cardiology Office Note   Date:  11/08/2016   ID:  Roger, Powers Apr 21, 1955, MRN [id number]  PCP:  Roger Evens, MD  Cardiologist:   Roger Carnes, MD   Pt returns for f/u of CP and CAD      History of Present Illness: Roger Powers is a 61 y.o. male with a history of  chest pain  I saw him earlier this summer  Pain worse at night in bed  Not with activity  Some SOB with activity  Pt reported he  had reflux  Treated for bacteria      Echo on 7/1 showed LVEF normal  Mild MR  Paternal side of family brother, fther GF died of MI    Pain felt to be atypical  Had a cardiac calcium score  Aora noted to be 43 mm  And calcium score was 339    Since seen the pt denies CP     Current Meds  Medication Sig  . aspirin 81 MG tablet Take 81 mg by mouth daily.  Marland Kitchen atorvastatin (LIPITOR) 40 MG tablet Take 40 mg by mouth daily.  . diazepam (VALIUM) 10 MG tablet Take 10 mg by mouth every 8 (eight) hours as needed for anxiety.  . hydrochlorothiazide (HYDRODIURIL) 12.5 MG tablet Take 12.5 mg by mouth every other day.  . lisinopril (PRINIVIL,ZESTRIL) 20 MG tablet Take 20 mg by mouth daily.  . pantoprazole (PROTONIX) 40 MG tablet Take 1 tablet (40 mg total) by mouth daily.  Marland Kitchen zolpidem (AMBIEN) 10 MG tablet Take 10 mg by mouth at bedtime as needed for sleep.      Allergies:   Patient has no known allergies.   Past Medical History:  Diagnosis Date  . Psoriasis     History reviewed. No pertinent surgical history.   Social History:  The patient  reports that he quit smoking about 37 years ago. He has quit using smokeless tobacco. He reports that he does not drink alcohol or use drugs.   Family History:  The patient's family history includes Cancer in his mother; Diabetes in his mother; Heart disease in his brother and father.    ROS:  Please see the history of present illness. All other systems are reviewed and  Negative to the above problem except as noted.    PHYSICAL EXAM: VS:  BP  140/70   Pulse 71   Ht 5\' 11"  (1.803 m)   Wt 210 lb 12.8 oz (95.6 kg)   SpO2 96%   BMI 29.40 kg/m   GEN: Well nourished, well developed, in no acute distress  HEENT: normal  Neck: JVP normal  NO, carotid bruits, or masses Cardiac: RRR; no murmurs, rubs, or gallops,no edema  Respiratory:  clear to auscultation bilaterally, normal work of breathing GI: soft, nontender, nondistended, + BS  No hepatomegaly  MS: no deformity Moving all extremities   Skin: warm and dry, no rash Neuro:  Strength and sensation are intact Psych: euthymic mood, full affect   EKG:  EKG is not ordered today   Lipid Panel    Component Value Date/Time   CHOL 134 09/29/2016 0737   TRIG 185 (H) 09/29/2016 0737   HDL 26 (L) 09/29/2016 0737   CHOLHDL 5.2 (H) 09/29/2016 0737   VLDL 37 (H) 09/29/2016 0737   LDLCALC 71 09/29/2016 0737      Wt Readings from Last 3 Encounters:  11/08/16 210 lb 12.8 oz (95.6 kg)  08/30/16 216 lb (98 kg)  05/07/11 201 lb (91.2 kg)      ASSESSMENT AND PLAN:  1  CP  CP is gone   I would recomm based on CT calcium score I would recomm CT angio (esp with SOB)     2  HTN continue current meds Increase lisinopril to 20 for titer control  3  Aortic aneurysm.  CT planned   4   HL  Continue atorvstatin  LDL 71  Walk  Take at night  Current medicines are reviewed at length with the patient today.  The patient does not have concerns regarding medicines.  Signed, Roger Carnes, MD  11/08/2016 10:00 AM    [address], [place], [place]  [address] Phone: [phone]; Fax: [phone]

## 2016-11-08 NOTE — Patient Instructions (Signed)
Your physician has recommended you make the following change in your medication:  1.) take a whole lisinopril tablet once a day (20 mg)  Please schedule a coronary CT scan.   Dr. Meda Coffee to read.  Your physician wants you to follow-up in: February, 2019 with Dr. Harrington Challenger.  You will receive a reminder letter in the mail two months in advance. If you don't receive a letter, please call our office to schedule the follow-up appointment.

## 2018-01-16 ENCOUNTER — Other Ambulatory Visit: Payer: Self-pay | Admitting: Internal Medicine

## 2018-01-18 ENCOUNTER — Telehealth: Payer: Self-pay | Admitting: Internal Medicine

## 2018-01-18 NOTE — Telephone Encounter (Signed)
Pt's pharmacy is requesting clarification for hydrochlorothiazide 12.5 mg capsule. The sig states for pt to take 6.25 mg tablet every other day. This medication is a capsule and capsule can not be cut. I only refilled the medication that was already on the pt's medication list. Please address

## 2018-01-19 NOTE — Telephone Encounter (Signed)
Left message for patient to call back  

## 2018-01-20 NOTE — Telephone Encounter (Signed)
Take 12.5 mg

## 2018-01-23 MED ORDER — HYDROCHLOROTHIAZIDE 12.5 MG PO CAPS: 12.5000 mg | ORAL_CAPSULE | Freq: Every day | ORAL | 1 refills | Status: DC

## 2018-01-23 NOTE — Telephone Encounter (Signed)
Sent prescription refill for 12.5 mg daily.

## 2018-12-31 DIAGNOSIS — Z20828 Contact with and (suspected) exposure to other viral communicable diseases: Secondary | ICD-10-CM | POA: Diagnosis not present

## 2019-01-09 ENCOUNTER — Other Ambulatory Visit: Payer: Self-pay

## 2019-01-09 DIAGNOSIS — Z20822 Contact with and (suspected) exposure to covid-19: Secondary | ICD-10-CM

## 2019-01-11 LAB — NOVEL CORONAVIRUS, NAA: SARS-CoV-2, NAA: NOT DETECTED

## 2019-01-17 ENCOUNTER — Telehealth: Payer: Self-pay | Admitting: *Deleted

## 2019-01-17 NOTE — Telephone Encounter (Signed)
Patient is calling for COVID test result- negative. Patient tested for possible exposure. Patient advised to continue safe practices, contact PCP for changes and get flu shot if not already current.

## 2019-01-30 DIAGNOSIS — Z23 Encounter for immunization: Secondary | ICD-10-CM | POA: Diagnosis not present

## 2019-01-30 DIAGNOSIS — E782 Mixed hyperlipidemia: Secondary | ICD-10-CM | POA: Diagnosis not present

## 2019-01-30 DIAGNOSIS — I10 Essential (primary) hypertension: Secondary | ICD-10-CM | POA: Diagnosis not present

## 2019-01-30 DIAGNOSIS — M13841 Other specified arthritis, right hand: Secondary | ICD-10-CM | POA: Diagnosis not present

## 2019-01-30 DIAGNOSIS — M13842 Other specified arthritis, left hand: Secondary | ICD-10-CM | POA: Diagnosis not present

## 2019-02-05 DIAGNOSIS — R1907 Generalized intra-abdominal and pelvic swelling, mass and lump: Secondary | ICD-10-CM | POA: Diagnosis not present

## 2019-02-05 DIAGNOSIS — G4700 Insomnia, unspecified: Secondary | ICD-10-CM | POA: Diagnosis not present

## 2019-02-05 DIAGNOSIS — E782 Mixed hyperlipidemia: Secondary | ICD-10-CM | POA: Diagnosis not present

## 2019-02-05 DIAGNOSIS — I10 Essential (primary) hypertension: Secondary | ICD-10-CM | POA: Diagnosis not present

## 2019-02-05 DIAGNOSIS — F419 Anxiety disorder, unspecified: Secondary | ICD-10-CM | POA: Diagnosis not present

## 2019-02-05 DIAGNOSIS — Z125 Encounter for screening for malignant neoplasm of prostate: Secondary | ICD-10-CM | POA: Diagnosis not present

## 2019-02-05 DIAGNOSIS — G629 Polyneuropathy, unspecified: Secondary | ICD-10-CM | POA: Diagnosis not present

## 2019-02-05 DIAGNOSIS — R7309 Other abnormal glucose: Secondary | ICD-10-CM | POA: Diagnosis not present

## 2019-04-27 ENCOUNTER — Encounter: Payer: Self-pay | Admitting: General Practice

## 2019-05-09 NOTE — Progress Notes (Signed)
Cardiology Office Note   Date:  05/10/2019   ID:  Roger Powers, Roger Powers 02-04-1956, MRN [id number]  PCP:  Lemmie Evens, MD  Cardiologist:   Dorris Carnes, MD   Pt returns for f/u of CP and CAD      History of Present Illness: Roger Powers is a 64 y.o. male with a history of atypical CP  ? GI    I last saw him in 2018      Calcium score  On CT was 339.  Aorta was noted to be mildly dilated at 43 mm  The patient follows with Vickey Sages.   He denies CP   Breathing is OK   In the interval he stopped HCTZ  Can't remember what lead him ?SE   But, BP stayed about the same  Usually 130/     Admits not as active as was before pandemic     Current Meds  Medication Sig  . aspirin 81 MG tablet Take 81 mg by mouth daily.  Marland Kitchen atorvastatin (LIPITOR) 40 MG tablet Take 40 mg by mouth daily.  . clobetasol (TEMOVATE) 0.05 % external solution   . diazepam (VALIUM) 10 MG tablet Take 10 mg by mouth every 8 (eight) hours as needed for anxiety.  Marland Kitchen lisinopril (PRINIVIL,ZESTRIL) 20 MG tablet Take 20 mg by mouth daily.  . pantoprazole (PROTONIX) 40 MG tablet Take 1 tablet (40 mg total) by mouth daily.  Marland Kitchen zolpidem (AMBIEN) 10 MG tablet Take 10 mg by mouth at bedtime as needed for sleep.      Allergies:   Patient has no known allergies.   Past Medical History:  Diagnosis Date  . Psoriasis     No past surgical history on file.   Social History:  The patient  reports that he quit smoking about 39 years ago. He has quit using smokeless tobacco. He reports that he does not drink alcohol or use drugs.   Family History:  The patient's family history includes Cancer in his mother; Diabetes in his mother; Heart disease in his brother and father.    ROS:  Please see the history of present illness. All other systems are reviewed and  Negative to the above problem except as noted.    PHYSICAL EXAM: VS:  BP (!) 158/90   Pulse 72   Ht 5\' 11"  (1.803 m)   Wt 225 lb 12.8 oz (102.4 kg)   BMI 31.49 kg/m     GEN: Obese 64 yo  in no acute distress  HEENT: normal  Neck: JVP normal  NO, carotid bruits Cardiac: RRR; Gr II/Vi syst murmur  No LE  edema  Respiratory:  clear to auscultation bilaterally, normal work of breathing GI: soft, nontender, nondistended, + BS  No hepatomegaly  MS: no deformity Moving all extremities   Skin: warm and dry, no rash Neuro:  Strength and sensation are intact Psych: euthymic mood, full affect   EKG:  EKG is ordered today  SR 72 bpm   RBBB.   Lipid Panel    Component Value Date/Time   CHOL 134 09/29/2016 0737   TRIG 185 (H) 09/29/2016 0737   HDL 26 (L) 09/29/2016 0737   CHOLHDL 5.2 (H) 09/29/2016 0737   VLDL 37 (H) 09/29/2016 0737   LDLCALC 71 09/29/2016 0737      Wt Readings from Last 3 Encounters:  05/10/19 225 lb 12.8 oz (102.4 kg)  11/08/16 210 lb 12.8 oz (95.6 kg)  08/30/16 216 lb (98  kg)      ASSESSMENT AND PLAN:  1  CP   Pt denies      2  CAD  Ca score shows some plaqung   No symtpoms   Continue to manage risk factors   2  HTN   BP is high here   He says usually better    Continue to follow   Make sure curff accurate  3  Aortic valve sclerosis.   WIll repeat echo  4  Aortic dilation   Will get echo to reeval  Pan for f/ in 1 year      4   HL  Continue atorvstatin  LDL 71  Walk  Take at night  Current medicines are reviewed at length with the patient today.  The patient does not have concerns regarding medicines.  Signed, Dorris Carnes, MD  05/10/2019 11:51 AM    [address], [place], [place]  [address] Phone: [phone]; Fax: [phone]

## 2019-05-10 ENCOUNTER — Other Ambulatory Visit: Payer: Self-pay

## 2019-05-10 ENCOUNTER — Ambulatory Visit (INDEPENDENT_AMBULATORY_CARE_PROVIDER_SITE_OTHER): Payer: PPO | Admitting: Internal Medicine

## 2019-05-10 ENCOUNTER — Encounter: Payer: Self-pay | Admitting: Internal Medicine

## 2019-05-10 VITALS — BP 158/90 | HR 72 | Ht 71.0 in | Wt 225.8 lb

## 2019-05-10 DIAGNOSIS — I10 Essential (primary) hypertension: Secondary | ICD-10-CM | POA: Diagnosis not present

## 2019-05-10 DIAGNOSIS — I340 Nonrheumatic mitral (valve) insufficiency: Secondary | ICD-10-CM

## 2019-05-10 NOTE — Patient Instructions (Addendum)
Medication Instructions:  No changes *If you need a refill on your cardiac medications before your next appointment, please call your pharmacy*   Lab Work: none If you have labs (blood work) drawn today and your tests are completely normal, you will receive your results only by: Marland Kitchen MyChart Message (if you have MyChart) OR . A paper copy in the mail If you have any lab test that is abnormal or we need to change your treatment, we will call you to review the results.   Testing/Procedures: Your physician has requested that you have an echocardiogram. Echocardiography is a painless test that uses sound waves to create images of your heart. It provides your doctor with information about the size and shape of your heart and how well your heart's chambers and valves are working. This procedure takes approximately one hour. There are no restrictions for this procedure.   Follow-Up: At [facility], you and your health needs are our priority.  As part of our continuing mission to provide you with exceptional heart care, we have created designated Provider Care Teams.  These Care Teams include your primary Cardiologist (physician) and Advanced Practice Providers (APPs -  Physician Assistants and Nurse Practitioners) who all work together to provide you with the care you need, when you need it.  We recommend signing up for the patient portal called "MyChart".  Sign up information is provided on this After Visit Summary.  MyChart is used to connect with patients for Virtual Visits (Telemedicine).  Patients are able to view lab/test results, encounter notes, upcoming appointments, etc.  Non-urgent messages can be sent to your provider as well.   To learn more about what you can do with MyChart, go to [email or web address].    Your next appointment:   12 month(s)  The format for your next appointment:   In Person  Provider:   You may see Dorris Carnes, MD or one of the following Advanced Practice  Providers on your designated Care Team:    Richardson Dopp, PA-C  Robbie Lis, [place]     Other Instructions Will get recent labs from PCP for Dr. Harrington Challenger to review.   Addendum: was unable to get through at Dr. Vickey Sages office. Will send message to medical records dept to request labs. Mwilson, rn 05/10/19 12:27 pm

## 2019-05-14 ENCOUNTER — Telehealth: Payer: Self-pay | Admitting: Internal Medicine

## 2019-05-14 NOTE — Telephone Encounter (Signed)
Medical records requested from [facility] 05/14/19 vlm

## 2019-05-16 ENCOUNTER — Encounter: Payer: Self-pay | Admitting: Orthopaedic Surgery

## 2019-05-16 ENCOUNTER — Ambulatory Visit (INDEPENDENT_AMBULATORY_CARE_PROVIDER_SITE_OTHER): Payer: PPO | Admitting: Orthopaedic Surgery

## 2019-05-16 ENCOUNTER — Other Ambulatory Visit: Payer: Self-pay

## 2019-05-16 DIAGNOSIS — M65342 Trigger finger, left ring finger: Secondary | ICD-10-CM

## 2019-05-16 MED ORDER — BUPIVACAINE HCL 0.25 % IJ SOLN
0.3300 mL | INTRAMUSCULAR | Status: AC | PRN
  Administered 2019-05-16: 11:00:00 .33 mL

## 2019-05-16 MED ORDER — METHYLPREDNISOLONE ACETATE 40 MG/ML IJ SUSP
13.3300 mg | INTRAMUSCULAR | Status: AC | PRN
  Administered 2019-05-16: 11:00:00 13.33 mg

## 2019-05-16 MED ORDER — LIDOCAINE HCL 1 % IJ SOLN
0.3000 mL | INTRAMUSCULAR | Status: AC | PRN
  Administered 2019-05-16: 11:00:00 .3 mL

## 2019-05-16 NOTE — Progress Notes (Signed)
Office Visit Note   Patient: Roger Powers           Date of Birth: [birth year]           MRN: [id number] Visit Date: 05/16/2019              Requested by: Lemmie Evens, MD [address],  [place] [address] PCP: Lemmie Evens, MD   Assessment & Plan: Visit Diagnoses:  1. Trigger finger, left ring finger     Plan: Injection performed dorsal splinting of the DIP joint discussed which she can do as needed.  Follow-up if he has persistent symptoms.  Follow-Up Instructions: No follow-ups on file.   Orders:  Orders Placed This Encounter  Procedures  . Hand/UE Inj   No orders of the defined types were placed in this encounter.     Procedures: Hand/UE Inj: L ring A1 for trigger finger on 05/16/2019 10:59 AM Medications: 0.3 mL lidocaine 1 %; 0.33 mL bupivacaine 0.25 %; 13.33 mg methylPREDNISolone acetate 40 MG/ML      Clinical Data: No additional findings.   Subjective: Chief Complaint  Patient presents with  . Left Ring Finger - Pain    HPI 64 year old male seen with left ring trigger finger which is actively triggering.  It wakes him up at night in flexed position has to manually reduce it.  He has noticed slight catching his opposite right hand index long and ring finger slightly but no true triggering.  Has Band-Aid over his thumb from recent cut that he had from some aluminum siding.  He is referred by another 1 my patients and trigger finger release after persistent triggering failed injection.  Review of Systems noncontributory to HPI.   Objective: Vital Signs: BP (!) 181/90   Pulse 73   Ht 5\' 11"  (1.803 m)   Wt 220 lb (99.8 kg)   BMI 30.68 kg/m   Physical Exam Constitutional:      Appearance: He is well-developed.  HENT:     Head: Normocephalic and atraumatic.  Eyes:     Pupils: Pupils are equal, round, and reactive to light.  Neck:     Thyroid: No thyromegaly.     Trachea: No tracheal deviation.  Cardiovascular:     Rate and Rhythm:  Normal rate.  Pulmonary:     Effort: Pulmonary effort is normal.     Breath sounds: No wheezing.  Abdominal:     General: Bowel sounds are normal.     Palpations: Abdomen is soft.  Skin:    General: Skin is warm and dry.     Capillary Refill: Capillary refill takes less than 2 seconds.  Neurological:     Mental Status: He is alert and oriented to person, place, and time.  Psychiatric:        Behavior: Behavior normal.        Thought Content: Thought content normal.        Judgment: Judgment normal.     Ortho Exam patient has tenderness over the A1 pulley active triggering left ring finger.  Specialty Comments:  No specialty comments available.  Imaging: No results found.   PMFS History: Patient Active Problem List   Diagnosis Date Noted  . Trigger finger, left ring finger 05/16/2019  . 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 Abnormal echocardiogram 05/23/2011  . Hypertension 05/23/2011   Past Medical History:  Diagnosis Date  . Psoriasis     Family History  Problem Relation Age of Onset  .  Diabetes Mother   . Cancer Mother   . Heart disease Father   . Heart disease Brother     No past surgical history on file. Social History   Occupational History  . Not on file  Tobacco Use  . Smoking status: Former Smoker    Quit date: 11/18/1979    Years since quitting: 39.5  . Smokeless tobacco: Former Network engineer and Sexual Activity  . Alcohol use: No  . Drug use: No  . Sexual activity: Not on file

## 2019-06-04 ENCOUNTER — Ambulatory Visit (HOSPITAL_COMMUNITY)
Admission: RE | Admit: 2019-06-04 | Discharge: 2019-06-04 | Disposition: A | Discharge status: Home / Self Care | Payer: PPO | Source: Ambulatory Visit | Attending: Internal Medicine | Admitting: Internal Medicine

## 2019-06-04 ENCOUNTER — Other Ambulatory Visit: Payer: Self-pay

## 2019-06-04 DIAGNOSIS — I340 Nonrheumatic mitral (valve) insufficiency: Secondary | ICD-10-CM | POA: Diagnosis not present

## 2019-06-04 DIAGNOSIS — I10 Essential (primary) hypertension: Secondary | ICD-10-CM | POA: Diagnosis not present

## 2019-06-04 NOTE — Progress Notes (Signed)
*  PRELIMINARY RESULTS* Echocardiogram 2D Echocardiogram has been performed.  Roger Powers 06/04/2019, 12:24 PM

## 2019-07-26 DIAGNOSIS — E782 Mixed hyperlipidemia: Secondary | ICD-10-CM | POA: Diagnosis not present

## 2019-07-26 DIAGNOSIS — R7309 Other abnormal glucose: Secondary | ICD-10-CM | POA: Diagnosis not present

## 2019-07-26 DIAGNOSIS — Z125 Encounter for screening for malignant neoplasm of prostate: Secondary | ICD-10-CM | POA: Diagnosis not present

## 2019-07-26 DIAGNOSIS — G629 Polyneuropathy, unspecified: Secondary | ICD-10-CM | POA: Diagnosis not present

## 2019-08-08 DIAGNOSIS — F419 Anxiety disorder, unspecified: Secondary | ICD-10-CM | POA: Diagnosis not present

## 2019-08-08 DIAGNOSIS — E782 Mixed hyperlipidemia: Secondary | ICD-10-CM | POA: Diagnosis not present

## 2019-08-08 DIAGNOSIS — R748 Abnormal levels of other serum enzymes: Secondary | ICD-10-CM | POA: Diagnosis not present

## 2019-08-08 DIAGNOSIS — M5412 Radiculopathy, cervical region: Secondary | ICD-10-CM | POA: Diagnosis not present

## 2019-08-21 ENCOUNTER — Other Ambulatory Visit (HOSPITAL_COMMUNITY): Payer: Self-pay | Admitting: Family Medicine

## 2019-08-21 ENCOUNTER — Other Ambulatory Visit: Payer: Self-pay | Admitting: Family Medicine

## 2019-08-21 DIAGNOSIS — I712 Thoracic aortic aneurysm, without rupture, unspecified: Secondary | ICD-10-CM

## 2019-08-31 ENCOUNTER — Ambulatory Visit (HOSPITAL_COMMUNITY): Payer: PPO

## 2019-09-11 ENCOUNTER — Ambulatory Visit (HOSPITAL_COMMUNITY)
Admission: RE | Admit: 2019-09-11 | Discharge: 2019-09-11 | Disposition: A | Discharge status: Home / Self Care | Payer: PPO | Source: Ambulatory Visit | Attending: Family Medicine | Admitting: Family Medicine

## 2019-09-11 ENCOUNTER — Other Ambulatory Visit: Payer: Self-pay

## 2019-09-11 DIAGNOSIS — I712 Thoracic aortic aneurysm, without rupture, unspecified: Secondary | ICD-10-CM

## 2019-09-11 DIAGNOSIS — I2510 Atherosclerotic heart disease of native coronary artery without angina pectoris: Secondary | ICD-10-CM | POA: Diagnosis not present

## 2019-09-11 DIAGNOSIS — R911 Solitary pulmonary nodule: Secondary | ICD-10-CM | POA: Diagnosis not present

## 2019-09-11 DIAGNOSIS — M47814 Spondylosis without myelopathy or radiculopathy, thoracic region: Secondary | ICD-10-CM | POA: Diagnosis not present

## 2019-09-11 LAB — POCT I-STAT CREATININE: Creatinine, Ser: 1 mg/dL (ref 0.61–1.24)

## 2019-09-11 MED ORDER — IOHEXOL 350 MG/ML SOLN
100.0000 mL | Freq: Once | INTRAVENOUS | Status: AC | PRN
  Administered 2019-09-11: 100 mL via INTRAVENOUS

## 2019-10-25 DIAGNOSIS — F419 Anxiety disorder, unspecified: Secondary | ICD-10-CM | POA: Diagnosis not present

## 2019-10-25 DIAGNOSIS — G4700 Insomnia, unspecified: Secondary | ICD-10-CM | POA: Diagnosis not present

## 2019-10-25 DIAGNOSIS — G629 Polyneuropathy, unspecified: Secondary | ICD-10-CM | POA: Diagnosis not present

## 2019-10-25 DIAGNOSIS — R7309 Other abnormal glucose: Secondary | ICD-10-CM | POA: Diagnosis not present

## 2019-10-25 DIAGNOSIS — Z125 Encounter for screening for malignant neoplasm of prostate: Secondary | ICD-10-CM | POA: Diagnosis not present

## 2019-10-25 DIAGNOSIS — E782 Mixed hyperlipidemia: Secondary | ICD-10-CM | POA: Diagnosis not present

## 2019-10-25 DIAGNOSIS — I10 Essential (primary) hypertension: Secondary | ICD-10-CM | POA: Diagnosis not present

## 2019-10-25 DIAGNOSIS — R1907 Generalized intra-abdominal and pelvic swelling, mass and lump: Secondary | ICD-10-CM | POA: Diagnosis not present

## 2019-11-06 DIAGNOSIS — I712 Thoracic aortic aneurysm, without rupture: Secondary | ICD-10-CM | POA: Diagnosis not present

## 2019-11-06 DIAGNOSIS — I10 Essential (primary) hypertension: Secondary | ICD-10-CM | POA: Diagnosis not present

## 2019-11-06 DIAGNOSIS — E663 Overweight: Secondary | ICD-10-CM | POA: Diagnosis not present

## 2019-11-06 DIAGNOSIS — Z23 Encounter for immunization: Secondary | ICD-10-CM | POA: Diagnosis not present

## 2019-11-06 DIAGNOSIS — N529 Male erectile dysfunction, unspecified: Secondary | ICD-10-CM | POA: Diagnosis not present

## 2019-12-20 ENCOUNTER — Ambulatory Visit: Payer: PPO | Attending: Internal Medicine

## 2019-12-20 DIAGNOSIS — Z23 Encounter for immunization: Secondary | ICD-10-CM

## 2019-12-20 NOTE — Progress Notes (Signed)
   Covid-19 Vaccination Clinic  Name:  Roger Powers    MRN: [id number] DOB: [birth year]  12/20/2019  Roger Powers was observed post Covid-19 immunization for 15 minutes without incident. He was provided with Vaccine Information Sheet and instruction to access the V-Safe system.   Roger Powers was instructed to call 911 with any severe reactions post vaccine: Marland Kitchen Difficulty breathing  . Swelling of face and throat  . A fast heartbeat  . A bad rash all over body  . Dizziness and weakness

## 2020-01-30 ENCOUNTER — Telehealth: Payer: Self-pay

## 2020-01-30 ENCOUNTER — Other Ambulatory Visit: Payer: Self-pay

## 2020-01-30 ENCOUNTER — Emergency Department (HOSPITAL_COMMUNITY): Payer: PPO

## 2020-01-30 ENCOUNTER — Emergency Department (HOSPITAL_COMMUNITY)
Admission: EM | Admit: 2020-01-30 | Discharge: 2020-01-30 | Disposition: A | Discharge status: Home / Self Care | Payer: PPO | Attending: Emergency Medicine | Admitting: Emergency Medicine

## 2020-01-30 ENCOUNTER — Encounter (HOSPITAL_COMMUNITY): Payer: Self-pay | Admitting: Emergency Medicine

## 2020-01-30 DIAGNOSIS — R202 Paresthesia of skin: Secondary | ICD-10-CM | POA: Diagnosis not present

## 2020-01-30 DIAGNOSIS — Z7982 Long term (current) use of aspirin: Secondary | ICD-10-CM | POA: Insufficient documentation

## 2020-01-30 DIAGNOSIS — R200 Anesthesia of skin: Secondary | ICD-10-CM

## 2020-01-30 DIAGNOSIS — Z79899 Other long term (current) drug therapy: Secondary | ICD-10-CM | POA: Insufficient documentation

## 2020-01-30 DIAGNOSIS — I719 Aortic aneurysm of unspecified site, without rupture: Secondary | ICD-10-CM | POA: Insufficient documentation

## 2020-01-30 DIAGNOSIS — I2510 Atherosclerotic heart disease of native coronary artery without angina pectoris: Secondary | ICD-10-CM | POA: Diagnosis not present

## 2020-01-30 DIAGNOSIS — R079 Chest pain, unspecified: Secondary | ICD-10-CM | POA: Diagnosis not present

## 2020-01-30 DIAGNOSIS — I10 Essential (primary) hypertension: Secondary | ICD-10-CM | POA: Insufficient documentation

## 2020-01-30 DIAGNOSIS — R519 Headache, unspecified: Secondary | ICD-10-CM | POA: Diagnosis not present

## 2020-01-30 DIAGNOSIS — Z87891 Personal history of nicotine dependence: Secondary | ICD-10-CM | POA: Insufficient documentation

## 2020-01-30 DIAGNOSIS — K429 Umbilical hernia without obstruction or gangrene: Secondary | ICD-10-CM | POA: Diagnosis not present

## 2020-01-30 DIAGNOSIS — I7121 Aneurysm of the ascending aorta, without rupture: Secondary | ICD-10-CM

## 2020-01-30 DIAGNOSIS — M5137 Other intervertebral disc degeneration, lumbosacral region: Secondary | ICD-10-CM | POA: Diagnosis not present

## 2020-01-30 DIAGNOSIS — J9811 Atelectasis: Secondary | ICD-10-CM | POA: Diagnosis not present

## 2020-01-30 DIAGNOSIS — I712 Thoracic aortic aneurysm, without rupture: Secondary | ICD-10-CM | POA: Diagnosis not present

## 2020-01-30 HISTORY — DX: Pure hypercholesterolemia, unspecified: E78.00

## 2020-01-30 HISTORY — DX: Essential (primary) hypertension: I10

## 2020-01-30 LAB — BASIC METABOLIC PANEL
Anion gap: 8 (ref 5–15)
BUN: 22 mg/dL (ref 8–23)
CO2: 24 mmol/L (ref 22–32)
Calcium: 9.1 mg/dL (ref 8.9–10.3)
Chloride: 103 mmol/L (ref 98–111)
Creatinine, Ser: 0.94 mg/dL (ref 0.61–1.24)
GFR, Estimated: >60 mL/min (ref >60)
Glucose, Bld: 118 mg/dL — ABNORMAL HIGH (ref 70–99)
Potassium: 4.7 mmol/L (ref 3.5–5.1)
Sodium: 135 mmol/L (ref 135–145)

## 2020-01-30 LAB — CBC WITH DIFFERENTIAL/PLATELET
Abs Immature Granulocytes: 0.03 10*3/uL (ref 0.00–0.07)
Basophils Absolute: 0 10*3/uL (ref 0.0–0.1)
Basophils Relative: 1 %
Eosinophils Absolute: 0.1 10*3/uL (ref 0.0–0.5)
Eosinophils Relative: 2 %
HCT: 46.5 % (ref 39.0–52.0)
Hemoglobin: 15.9 g/dL (ref 13.0–17.0)
Immature Granulocytes: 0 %
Lymphocytes Relative: 26 %
Lymphs Abs: 1.9 10*3/uL (ref 0.7–4.0)
MCH: 32.7 pg (ref 26.0–34.0)
MCHC: 34.2 g/dL (ref 30.0–36.0)
MCV: 95.7 fL (ref 80.0–100.0)
Monocytes Absolute: 0.6 10*3/uL (ref 0.1–1.0)
Monocytes Relative: 9 %
Neutro Abs: 4.5 10*3/uL (ref 1.7–7.7)
Neutrophils Relative %: 62 %
Platelets: 282 10*3/uL (ref 150–400)
RBC: 4.86 MIL/uL (ref 4.22–5.81)
RDW: 12.3 % (ref 11.5–15.5)
WBC: 7.1 10*3/uL (ref 4.0–10.5)
nRBC: 0 % (ref 0.0–0.2)

## 2020-01-30 LAB — TROPONIN I (HIGH SENSITIVITY)
Troponin I (High Sensitivity): 5 ng/L (ref <18)
Troponin I (High Sensitivity): 5 ng/L (ref <18)

## 2020-01-30 MED ORDER — IOHEXOL 350 MG/ML SOLN
100.0000 mL | Freq: Once | INTRAVENOUS | Status: AC | PRN
  Administered 2020-01-30: 100 mL via INTRAVENOUS

## 2020-01-30 NOTE — Progress Notes (Signed)
Patient discsussed with ER staff. Presents with chest pain. Trops neg x 2, EKG without ischemic changes. CTA without acute aortic pathology. From Dr Harrington Challenger' notes, he has history of prior atypical chest pain, CTA this visit and prior coronary calcium has shown some CAD.   Will look to arrange outpatient lexiscan, hopefully for Friday. [place] for discharge from ER, we will arrage stress test and f/u   Carlyle Dolly MD

## 2020-01-30 NOTE — Discharge Instructions (Addendum)
Follow-up closely with your primary doctor and cardiology to arrange stress test and reevaluation. Return for worsening chest pain, passing out, stroke symptoms or new concerns. Take aspirin daily until you see the cardiologist.

## 2020-01-30 NOTE — ED Provider Notes (Signed)
[facility] Provider Note   CSN: [id number] Arrival date & time: 01/30/20  6269     History Chief Complaint  Patient presents with  . Numbness    [facility] is a 64 y.o. male.  Patient presents with left-sided chest pain nonradiating since 3 this morning fairly constant with intermittent worsening in addition to feeling numbness in bilateral hands and feet.  No history of similar.  No cardiac history.  Drinks 1 glass of wine per night.  Patient has history of high blood pressure and cholesterol.  Patient felt lightheaded and not himself this morning.  Patient denies any abdominal pain or vomiting however got diaphoretic this morning.  No recent exertional symptoms.        Past Medical History:  Diagnosis Date  . Hypercholesteremia   . Hypertension   . Psoriasis     Patient Active Problem List   Diagnosis Date Noted  . Trigger finger, left ring finger 05/16/2019  . 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 Abnormal echocardiogram 05/23/2011  . Hypertension 05/23/2011    History reviewed. No pertinent surgical history.     Family History  Problem Relation Age of Onset  . Diabetes Mother   . Cancer Mother   . Heart disease Father   . Heart disease Brother     Social History   Tobacco Use  . Smoking status: Former Smoker    Quit date: 11/18/1979    Years since quitting: 40.2  . Smokeless tobacco: Former Network engineer  . Vaping Use: Never used  Substance Use Topics  . Alcohol use: No  . Drug use: No    Home Medications Prior to Admission medications   Medication Sig Start Date End Date Taking? Authorizing Provider  aspirin 81 MG tablet Take 81 mg by mouth daily.    [provider]  atorvastatin (LIPITOR) 40 MG tablet Take 40 mg by mouth daily.    [provider]  clobetasol (TEMOVATE) 0.05 % external solution  01/30/19   [provider]  diazepam (VALIUM) 10 MG tablet Take 10 mg by mouth every 8  (eight) hours as needed for anxiety.    [provider]  lisinopril (PRINIVIL,ZESTRIL) 20 MG tablet Take 20 mg by mouth daily.    [provider]  pantoprazole (PROTONIX) 40 MG tablet Take 1 tablet (40 mg total) by mouth daily. 08/30/16   [name], MD  zolpidem (AMBIEN) 10 MG tablet Take 10 mg by mouth at bedtime as needed for sleep.  04/27/11   [provider]    Allergies    Patient has no known allergies.  Review of Systems   Review of Systems  Constitutional: Negative for chills and fever.  HENT: Negative for congestion.   Eyes: Negative for visual disturbance.  Respiratory: Negative for shortness of breath.   Cardiovascular: Positive for chest pain. Negative for leg swelling.  Gastrointestinal: Negative for abdominal pain and vomiting.  Genitourinary: Negative for dysuria and flank pain.  Musculoskeletal: Negative for back pain, neck pain and neck stiffness.  Skin: Negative for rash.  Neurological: Positive for light-headedness, numbness and headaches.    Physical Exam Updated Vital Signs BP (!) 155/81   Pulse 71   Temp 98.5 F (36.9 C) (Oral)   Resp 16   Ht 5\' 11"  (1.803 m)   Wt 99.8 kg   SpO2 95%   BMI 30.68 kg/m   Physical Exam  ED Results / Procedures / Treatments  Labs (all labs ordered are listed, but only abnormal results are displayed) Labs Reviewed  BASIC METABOLIC PANEL - Abnormal; Notable for the following components:      Result Value   Glucose, Bld 118 (*)    All other components within normal limits  RESP PANEL BY RT-PCR (FLU A&B, COVID) [id number]  CBC WITH DIFFERENTIAL/PLATELET  TROPONIN I (HIGH SENSITIVITY)  TROPONIN I (HIGH SENSITIVITY)    EKG EKG Interpretation  Date/Time:  Wednesday January 30 2020 08:27:01 EST Ventricular Rate:  78 PR Interval:    QRS Duration: 152 QT Interval:  392 QTC Calculation: 447 R Axis:   27 Text Interpretation: Sinus rhythm Right bundle branch block Confirmed by Elnora Morrison  [phone]) on 01/30/2020 8:34:12 AM   Radiology CT Head Wo Contrast  Result Date: 01/30/2020 CLINICAL DATA:  Headaches and left-sided chest pain EXAM: CT HEAD WITHOUT CONTRAST TECHNIQUE: Contiguous axial images were obtained from the base of the skull through the vertex without intravenous contrast. COMPARISON:  None. FINDINGS: Brain: No evidence of acute infarction, hemorrhage, hydrocephalus, extra-axial collection or mass lesion/mass effect. Vascular: No hyperdense vessel or unexpected calcification. Skull: Normal. Negative for fracture or focal lesion. Sinuses/Orbits: No acute finding. Other: None. IMPRESSION: No acute intracranial abnormality noted. Electronically Signed   By: [name] M.D.   On: 01/30/2020 10:00   DG Chest Portable 1 View  Result Date: 01/30/2020 CLINICAL DATA:  Pt c/o pain in Lt side of chest with radiation into Lt arm since 0300hrs today. COVID test pending, no known Hx COVID. Hx HTN, former smoker EXAM: PORTABLE CHEST - 1 VIEW COMPARISON:  04/04/2012 FINDINGS: Lungs are clear. Heart size and mediastinal contours are within normal limits. No effusion.  No pneumothorax. Visualized bones unremarkable. IMPRESSION: No acute cardiopulmonary disease. Electronically Signed   By: Lucrezia Europe M.D.   On: 01/30/2020 08:56   CT Angio Chest/Abd/Pel for Dissection W and/or Wo Contrast  Result Date: 01/30/2020 CLINICAL DATA:  Left chest pain, bilateral hand and foot numbness EXAM: CT ANGIOGRAPHY CHEST, ABDOMEN AND PELVIS TECHNIQUE: Noncontrast CT through the chest was obtained. Multidetector CT imaging through the chest, abdomen and pelvis was performed using the standard protocol during bolus administration of intravenous contrast. Multiplanar reconstructed images and MIPs were obtained and reviewed to evaluate the vascular anatomy. CONTRAST:  145mL OMNIPAQUE IOHEXOL 350 MG/ML SOLN COMPARISON:  09/11/2019 FINDINGS: CTA CHEST FINDINGS Cardiovascular: Heart size upper limits normal. No  pericardial effusion. Fair contrast opacification of central pulmonary arteries; the exam was not optimized for detection of pulmonary emboli. Scattered coronary calcifications. Good contrast opacification of the thoracic aorta, with no evidence of dissection or stenosis. Aortic Root: --Valve: 2.3 cm --Sinuses: 4 cm --Sinotubular Junction: 3.5 cm Limitations by motion: Moderate Thoracic Aorta: --Ascending Aorta: 4 cm (previously 4.1) --Aortic Arch: 3.5 cm --Descending Aorta: 2.9 cm Mild scattered plaque in the descending thoracic aorta. Bovine variant brachiocephalic arterial origin anatomy without proximal stenosis. Mediastinum/Nodes: No mass or adenopathy. Stable 1 cm right paratracheal node. Lungs/Pleura: No pleural effusion. No pneumothorax. Minimal dependent atelectasis posteriorly in both lower lobes. No focal airspace consolidation or nodule. Musculoskeletal: No chest wall abnormality. No acute or significant osseous findings. Review of the MIP images confirms the above findings. CTA ABDOMEN AND PELVIS FINDINGS VASCULAR Aorta: Minimal atheromatous plaque in the infrarenal segment. No aneurysm, dissection, or stenosis. Celiac: Patent without evidence of aneurysm, dissection, vasculitis or significant stenosis. SMA: Patent without evidence of aneurysm, dissection, vasculitis or significant stenosis. Renals: Both renal arteries are patent  without evidence of aneurysm, dissection, vasculitis, fibromuscular dysplasia or significant stenosis. IMA: Patent without evidence of aneurysm, dissection, vasculitis or significant stenosis. Inflow: Patent without evidence of aneurysm, dissection, vasculitis or significant stenosis. Minimal scattered calcified plaque. Veins: No obvious venous abnormality within the limitations of this arterial phase study. Portal vein and bilateral renal veins patent. Review of the MIP images confirms the above findings. NON-VASCULAR Hepatobiliary: Fatty liver. No focal lesion or biliary  ductal dilatation. Gallbladder nondistended. Pancreas: Unremarkable. No pancreatic ductal dilatation or surrounding inflammatory changes. Spleen: Normal in size without focal abnormality. Adrenals/Urinary Tract: Adrenal glands are unremarkable. Kidneys are normal, without renal calculi, focal lesion, or hydronephrosis. Bladder is unremarkable. Stomach/Bowel: Stomach is nondistended. Small bowel is nondilated. Appendix not discretely identified. No pericecal inflammatory/edematous change. The colon is nondilated, unremarkable. Lymphatic: No abdominal or pelvic adenopathy. Reproductive: Prostate is unremarkable. Other: No ascites.  No free air. Musculoskeletal: Small paraumbilical hernia containing only mesenteric fat. Advanced degenerative disc disease L5-S1. No fracture or worrisome bone lesion. Review of the MIP images confirms the above findings. IMPRESSION: 1. Negative for acute PE or thoracic aortic dissection. 2. 4 cm ascending aortic aneurysm, previously 4.1. Recommend annual imaging followup by CTA or MRA. This recommendation follows 2010 ACCF/AHA/AATS/ACR/ASA/SCA/SCAI/SIR/STS/SVM Guidelines for the Diagnosis and Management of Patients with Thoracic Aortic Disease. Circulation. 2010; 121: [id number] 3. Coronary and Aortic Atherosclerosis (ICD10-I70.0). 4. Fatty liver. 5. Small paraumbilical hernia containing only mesenteric fat. Electronically Signed   By: Lucrezia Europe M.D.   On: 01/30/2020 10:14    Procedures Procedures (including critical care time)  Medications Ordered in ED Medications  iohexol (OMNIPAQUE) 350 MG/ML injection 100 mL (100 mLs Intravenous Contrast Given 01/30/20 0942)    ED Course  I have reviewed the triage vital signs and the nursing notes.  Pertinent labs & imaging results that were available during my care of the patient were reviewed by me and considered in my medical decision making (see chart for details).    MDM Rules/Calculators/A&P                          Patient  presents with acute chest pain and extremity numbness.  No focal deficits on neuro exam, subjective decrease sensation in hands and feet bilateral without history of diabetes or similar. Differential diagnosis including stroke given headache and numbness, ACS, dissection, diabetes, other.  Plan for blood work, troponins, EKG, chest x-ray and CT scan for further delineation. Medical records reviewed showing patient does have a history of dilated aortic root. Vital signs on arrival show hypertension, afebrile. Patient symptoms resolved in the ER.  Delta troponin negative.  EKG reviewed no acute ischemic findings.  Basic blood work unremarkable normal hemoglobin, normal white blood cell count, normal kidney function. CT scan results reviewed showing known aortic aneurysm without dissection or leak, stable for continued outpatient follow-up with serial exams. Discussed with Dr. Harl Bowie regarding patient's age, risk factors and presentation and he will arrange stress test on Friday.  Patient is to continue to take aspirin daily.    Final Clinical Impression(s) / ED Diagnoses Final diagnoses:  Acute chest pain  Bilateral hand numbness  Ascending aortic aneurysm [facility])    Rx / DC Orders ED Discharge Orders    None       Elnora Morrison, MD 01/30/20 1129

## 2020-01-30 NOTE — Telephone Encounter (Signed)
Order placed, instructions entered,front office staff to schedule

## 2020-01-30 NOTE — ED Triage Notes (Signed)
Pt woke up at 0300 with left sided chest pain. At 0600 pt c/o of bilateral hand, feet and lip numbness. LKW 2300 last night

## 2020-01-30 NOTE — ED Notes (Signed)
Transport to radiology

## 2020-01-30 NOTE — Telephone Encounter (Signed)
-----   Message from Erma Heritage, [place] sent at 01/30/2020 11:29 AM EST ----- Regarding: Outpatient Lexiscan Myoview Good morning,   This patient needs a Lexiscan Myoview for chest pain per Dr. Harl Bowie. Being discharged from the ED currently so will need to be called with the date/time. Branch requested it to be performed ASAP (Friday if possible but not sure if insurance would be able to approve by then). If not Friday, then early next week.   Thanks,  [place]

## 2020-01-31 ENCOUNTER — Encounter (HOSPITAL_COMMUNITY): Payer: Self-pay

## 2020-01-31 ENCOUNTER — Encounter (HOSPITAL_COMMUNITY)
Admission: RE | Admit: 2020-01-31 | Discharge: 2020-01-31 | Disposition: A | Discharge status: Home / Self Care | Payer: PPO | Source: Ambulatory Visit | Attending: Cardiology | Admitting: Cardiology

## 2020-01-31 ENCOUNTER — Ambulatory Visit (HOSPITAL_COMMUNITY)
Admission: RE | Admit: 2020-01-31 | Discharge: 2020-01-31 | Disposition: A | Discharge status: Home / Self Care | Payer: PPO | Source: Ambulatory Visit | Attending: Cardiology | Admitting: Cardiology

## 2020-01-31 DIAGNOSIS — R079 Chest pain, unspecified: Secondary | ICD-10-CM | POA: Diagnosis not present

## 2020-01-31 LAB — NM MYOCAR MULTI W/SPECT W/WALL MOTION / EF
LV dias vol: 106 mL (ref 62–150)
LV sys vol: 34 mL
Peak HR: 86 {beats}/min
RATE: 0.3
Rest HR: 61 {beats}/min
SDS: 0
SRS: 0
SSS: 0
TID: 1.06

## 2020-01-31 MED ORDER — TECHNETIUM TC 99M TETROFOSMIN IV KIT
30.0000 | PACK | Freq: Once | INTRAVENOUS | Status: AC | PRN
  Administered 2020-01-31: 31 via INTRAVENOUS

## 2020-01-31 MED ORDER — TECHNETIUM TC 99M TETROFOSMIN IV KIT
10.0000 | PACK | Freq: Once | INTRAVENOUS | Status: AC | PRN
  Administered 2020-01-31: 10.2 via INTRAVENOUS

## 2020-01-31 MED ORDER — SODIUM CHLORIDE FLUSH 0.9 % IV SOLN
INTRAVENOUS | Status: AC
  Administered 2020-01-31: 10 mL via INTRAVENOUS
  Filled 2020-01-31: qty 10

## 2020-01-31 MED ORDER — REGADENOSON 0.4 MG/5ML IV SOLN
INTRAVENOUS | Status: AC
  Administered 2020-01-31: 0.4 mg via INTRAVENOUS
  Filled 2020-01-31: qty 5

## 2020-02-05 DIAGNOSIS — L400 Psoriasis vulgaris: Secondary | ICD-10-CM | POA: Diagnosis not present

## 2020-02-05 DIAGNOSIS — I712 Thoracic aortic aneurysm, without rupture: Secondary | ICD-10-CM | POA: Diagnosis not present

## 2020-02-05 DIAGNOSIS — R945 Abnormal results of liver function studies: Secondary | ICD-10-CM | POA: Diagnosis not present

## 2020-02-05 DIAGNOSIS — E782 Mixed hyperlipidemia: Secondary | ICD-10-CM | POA: Diagnosis not present

## 2020-02-05 NOTE — Progress Notes (Signed)
Cardiology Office Note  Date: 02/06/2020   ID: Roger, Powers [birth year], MRN [id number]  PCP:  Gareth Morgan, MD  Cardiologist:  Dietrich Pates, MD Electrophysiologist:  None   Chief Complaint: Hospital follow-up chest pain / stress test   History of Present Illness: Roger Powers is a 64 y.o. male with a history of essential hypertension, mitral valve insufficiency, chest pain.  Last encounter with Dr. Tenny Craw 05/10/2019.  Had a previous CT scan with calcium score of 339 in 2018.  Aorta mildly dilated at 43 mm.  He denied any chest pain.  He had stopped his HCTZ.  Blood pressure stayed about the same around 130 systolic.  Blood pressure was elevated at clinic.  Stated it was usually better at home.  Plan to repeat echo for aortic valve sclerosis and aortic dilatation.  To continue statin for LDL 71.   Patient presented with chest pain to [facility] emergency department on 01/30/2020.  Dr. Wyline Mood saw the patient.  His troponins were negative x2.  EKG showed no ischemic changes.  CTA without acute aortic pathology.  History of prior atypical chest pain.  CTA this visit and prior had shown some CAD.    He is here for follow-up today and review of stress test results.  Discussed the fact that the stress test showed no evidence of ischemia and was deemed a low risk study.  He denies any subsequent chest pain, pressure, tightness, neck, arm, back, or jaw pain.  He is wondering if the recent episode of chest pain may have been a panic attack or something similar since he has been under a fair amount of stress due to recent divorce and a son with special needs and various other business related stressors.  His blood pressure remains elevated today.  Over the last 3 visits in the epic system his blood pressure has been elevated.  He denies any exertional symptoms, orthostatic symptoms, CVA or TIA-like symptoms, PND, orthopnea, lower extremity edema.  Past Medical History:  Diagnosis Date  .  Hypercholesteremia   . Hypertension   . Psoriasis     Past Surgical History:  Procedure Laterality Date  . NO PAST SURGERIES      Current Outpatient Medications  Medication Sig Dispense Refill  . aspirin 81 MG tablet Take 81 mg by mouth daily.    Marland Kitchen atorvastatin (LIPITOR) 40 MG tablet Take 40 mg by mouth daily.    . clobetasol (TEMOVATE) 0.05 % external solution     . diazepam (VALIUM) 10 MG tablet Take 10 mg by mouth every 8 (eight) hours as needed for anxiety.    . pantoprazole (PROTONIX) 40 MG tablet Take 1 tablet (40 mg total) by mouth daily. 90 tablet 3  . sulfamethoxazole-trimethoprim (BACTRIM DS) 800-160 MG tablet Take 1 tablet by mouth 2 (two) times daily.    Marland Kitchen zolpidem (AMBIEN) 10 MG tablet Take 10 mg by mouth at bedtime as needed for sleep.     Marland Kitchen lisinopril (ZESTRIL) 40 MG tablet Take 1 tablet (40 mg total) by mouth daily. 90 tablet 3   No current facility-administered medications for this visit.   Allergies:  Patient has no known allergies.   Social History: The patient  reports that he quit smoking about 40 years ago. He has quit using smokeless tobacco. He reports that he does not drink alcohol and does not use drugs.   Family History: The patient's family history includes Cancer in his mother; Diabetes in his  mother; Heart disease in his brother and father.   ROS:  Please see the history of present illness. Otherwise, complete review of systems is positive for none.  All other systems are reviewed and negative.   Physical Exam: VS:  BP (!) 156/82   Pulse 70   Ht 5\' 11"  (1.803 m)   Wt 220 lb 12.8 oz (100.2 kg)   SpO2 96%   BMI 30.80 kg/m , BMI Body mass index is 30.8 kg/m.  Wt Readings from Last 3 Encounters:  02/06/20 220 lb 12.8 oz (100.2 kg)  01/30/20 220 lb (99.8 kg)  05/16/19 220 lb (99.8 kg)    General: Patient appears comfortable at rest. Neck: Supple, no elevated JVP or carotid bruits, no thyromegaly. Lungs: Clear to auscultation, nonlabored  breathing at rest. Cardiac: Regular rate and rhythm, no S3 or significant systolic murmur, no pericardial rub. Extremities: No pitting edema, distal pulses 2+. Skin: Warm and dry. Musculoskeletal: No kyphosis. Neuropsychiatric: Alert and oriented x3, affect grossly appropriate.  ECG:  EKG on 01/30/2020 sinus rhythm rate of 78, right bundle branch block  Recent Labwork: 01/30/2020: BUN 22; Creatinine, Ser 0.94; Hemoglobin 15.9; Platelets 282; Potassium 4.7; Sodium 135     Component Value Date/Time   CHOL 134 09/29/2016 0737   TRIG 185 (H) 09/29/2016 0737   HDL 26 (L) 09/29/2016 0737   CHOLHDL 5.2 (H) 09/29/2016 0737   VLDL 37 (H) 09/29/2016 0737   LDLCALC 71 09/29/2016 0737    Other Studies Reviewed Today:  CTA chest, abdomen, pelvis for dissection 01/30/2020 IMPRESSION: 1. Negative for acute PE or thoracic aortic dissection. 2. 4 cm ascending aortic aneurysm, previously 4.1.  Recommend annual imaging followup by CTA or MRA. This recommendation follows 2010 ACCF/AHA/AATS/ACR/ASA/SCA/SCAI/SIR/STS/SVM Guidelines for the Diagnosis and Management of Patients with Thoracic Aortic Disease. Circulation. 2010; 121: [id number] 3. Coronary and Aortic Atherosclerosis (ICD10-I70.0). 4. Fatty liver. 5. Small paraumbilical hernia containing only mesenteric fat.    Nuclear stress test 01/31/2020 Narrative & Impression   No diagnostic ST segment changes to indicate ischemia.  Small, mild intensity, basal inferolateral defect that is partially reversible with summed stress score actually calculated at 0. This suggests variable diaphragmatic attenuation rather than an ischemic territory.  This is a low risk study.  Nuclear stress EF: 68%.      CT angiography chest 09/11/2019 for follow-up thoracic aortic aneurysm IMPRESSION: Ascending aorta measures 4.1 cm. The difference in measurements is likely related to the imaging technique and contrast enhancement. Recommend annual imaging  followup by CTA or MRA. This recommendation follows 2010 ACCF/AHA/AATS/ACR/ASA/SCA/SCAI/SIR/STS/SVM Guidelines for the Diagnosis and Management of Patients with Thoracic Aortic Disease. Circulation. 2010; 121ML:[id number]. Aortic aneurysm NOS (ICD10-I71.9)  Fatty liver.  4 mm right upper lobe nodule. No follow-up needed if patient is low-risk. Non-contrast chest CT can be considered in 12 months if patient is high-risk. This recommendation follows the consensus statement: Guidelines for Management of Incidental Pulmonary Nodules Detected on CT Images: From the Fleischner Society 2017; Radiology 2017; 284:228-243.   Echocardiogram 06/04/2019 1. Left ventricular ejection fraction, by estimation, is 60 to 65%. The left ventricle has normal function. The left ventricle has no regional wall motion abnormalities. There is mild concentric left ventricular hypertrophy. Left ventricular diastolic parameters are consistent with Grade I diastolic dysfunction (impaired relaxation). 2. Right ventricular systolic function is normal. The right ventricular size is normal. 3. The mitral valve is grossly normal. No evidence of mitral valve regurgitation. 4. The aortic valve is tricuspid. Aortic valve  regurgitation is not visualized. No aortic stenosis is present. 5. The inferior vena cava is normal in size with greater than 50% respiratory variability, suggesting right atrial pressure of 3 mmHg.  Assessment and Plan:  1. Essential hypertension   2. Thoracic ascending aortic aneurysm ([place])   3. Mixed hyperlipidemia   4. Medication management    1. Essential hypertension Blood pressure remains elevated.  Increase lisinopril to 40 mg daily.  Get BMP and magnesium in 2 weeks.  3. Thoracic ascending aortic aneurysm ([facility]) Evidence of 4 cm ascending aortic aneurysm on recent CT scan of chest abdomen and pelvis on 01/30/2020  4. Mixed hyperlipidemia Continue atorvastatin 40 mg p.o. daily.   Medication  Adjustments/Labs and Tests Ordered: Current medicines are reviewed at length with the patient today.  Concerns regarding medicines are outlined above.   Disposition: Follow-up with Dr. Harrington Challenger or APP 6 months  Signed, Levell July, NP 02/06/2020 2:41 PM    [facility] [place], [place], [place] [address] Phone: [phone]; Fax: [phone]

## 2020-02-06 ENCOUNTER — Encounter: Payer: Self-pay | Admitting: Family Medicine

## 2020-02-06 ENCOUNTER — Ambulatory Visit: Payer: PPO | Admitting: Family Medicine

## 2020-02-06 VITALS — BP 156/82 | HR 70 | Ht 71.0 in | Wt 220.8 lb

## 2020-02-06 DIAGNOSIS — I712 Thoracic aortic aneurysm, without rupture: Secondary | ICD-10-CM | POA: Diagnosis not present

## 2020-02-06 DIAGNOSIS — Z79899 Other long term (current) drug therapy: Secondary | ICD-10-CM | POA: Diagnosis not present

## 2020-02-06 DIAGNOSIS — N410 Acute prostatitis: Secondary | ICD-10-CM | POA: Diagnosis not present

## 2020-02-06 DIAGNOSIS — L400 Psoriasis vulgaris: Secondary | ICD-10-CM | POA: Diagnosis not present

## 2020-02-06 DIAGNOSIS — E782 Mixed hyperlipidemia: Secondary | ICD-10-CM | POA: Diagnosis not present

## 2020-02-06 DIAGNOSIS — R945 Abnormal results of liver function studies: Secondary | ICD-10-CM | POA: Diagnosis not present

## 2020-02-06 DIAGNOSIS — I7121 Aneurysm of the ascending aorta, without rupture: Secondary | ICD-10-CM

## 2020-02-06 DIAGNOSIS — I10 Essential (primary) hypertension: Secondary | ICD-10-CM

## 2020-02-06 DIAGNOSIS — Z125 Encounter for screening for malignant neoplasm of prostate: Secondary | ICD-10-CM | POA: Diagnosis not present

## 2020-02-06 MED ORDER — LISINOPRIL 40 MG PO TABS: 40.0000 mg | ORAL_TABLET | Freq: Every day | ORAL | 3 refills | Status: DC

## 2020-02-06 NOTE — Patient Instructions (Addendum)
Medication Instructions:    Your physician has recommended you make the following change in your medication:   Increase lisinopril to 40 mg daily  Continue other medications the same  Labwork:  Your physician recommends that you return for lab work in: 7-10 days to check your BMET & Mg. This may be done at [facility] in [place] ([place])  Testing/Procedures:  None  Follow-Up:  Your physician recommends that you schedule a follow-up appointment in: 6 months.  Any Other Special Instructions Will Be Listed Below (If Applicable).  If you need a refill on your cardiac medications before your next appointment, please call your pharmacy.

## 2020-02-28 LAB — BASIC METABOLIC PANEL
BUN: 20 mg/dL (ref 7–25)
CO2: 27 mmol/L (ref 20–32)
Calcium: 9.8 mg/dL (ref 8.6–10.3)
Chloride: 103 mmol/L (ref 98–110)
Creat: 1.09 mg/dL (ref 0.70–1.25)
Glucose, Bld: 75 mg/dL (ref 65–139)
Potassium: 5.4 mmol/L — ABNORMAL HIGH (ref 3.5–5.3)
Sodium: 137 mmol/L (ref 135–146)

## 2020-02-28 LAB — MAGNESIUM: Magnesium: 2.3 mg/dL (ref 1.5–2.5)

## 2020-03-03 ENCOUNTER — Telehealth: Payer: Self-pay | Admitting: *Deleted

## 2020-03-03 NOTE — Telephone Encounter (Signed)
Laurine Blazer, LPN  9/74/1638 4:53 PM EST Back to Top     Notified, copy to pcp.

## 2020-03-03 NOTE — Telephone Encounter (Signed)
-----   Message from Merlene Laughter, RN sent at 02/28/2020  9:20 AM EST -----  ----- Message ----- From: Verta Ellen., NP Sent: 02/28/2020   8:08 AM EST To: Merlene Laughter, RN  Labs look good other than potassium just slightly above normal at 5.4.  Thank you

## 2020-06-10 DIAGNOSIS — L820 Inflamed seborrheic keratosis: Secondary | ICD-10-CM | POA: Diagnosis not present

## 2020-06-10 DIAGNOSIS — L400 Psoriasis vulgaris: Secondary | ICD-10-CM | POA: Diagnosis not present

## 2020-07-22 DIAGNOSIS — E782 Mixed hyperlipidemia: Secondary | ICD-10-CM | POA: Diagnosis not present

## 2020-07-22 DIAGNOSIS — Z8042 Family history of malignant neoplasm of prostate: Secondary | ICD-10-CM | POA: Diagnosis not present

## 2020-07-30 DIAGNOSIS — Z7189 Other specified counseling: Secondary | ICD-10-CM | POA: Diagnosis not present

## 2020-07-30 DIAGNOSIS — E6609 Other obesity due to excess calories: Secondary | ICD-10-CM | POA: Diagnosis not present

## 2020-07-30 DIAGNOSIS — E782 Mixed hyperlipidemia: Secondary | ICD-10-CM | POA: Diagnosis not present

## 2020-07-30 DIAGNOSIS — R945 Abnormal results of liver function studies: Secondary | ICD-10-CM | POA: Diagnosis not present

## 2020-08-21 ENCOUNTER — Other Ambulatory Visit: Payer: Self-pay

## 2020-08-21 ENCOUNTER — Ambulatory Visit: Payer: PPO | Admitting: Internal Medicine

## 2020-08-21 ENCOUNTER — Encounter: Payer: Self-pay | Admitting: Internal Medicine

## 2020-08-21 VITALS — BP 138/80 | HR 64 | Ht 71.0 in | Wt 211.8 lb

## 2020-08-21 DIAGNOSIS — I2510 Atherosclerotic heart disease of native coronary artery without angina pectoris: Secondary | ICD-10-CM

## 2020-08-21 NOTE — Progress Notes (Signed)
Cardiology Office Note   Date:  08/21/2020   ID:  Roger, Powers 03/18/1955, MRN [id number]  PCP:  Lemmie Evens, MD  Cardiologist:   Dorris Carnes, MD   Pt returns for f/u of CP and CAD      History of Present Illness: Roger Powers is a 65 y.o. male with a history of atypical CP, CAD by CT scan (Calcium score 339), mild aortic dilitation   43 mm  I last saw the pt in Mrach 2021      The pt presented to ED in Dec 2021 with CP   Trop were negative  CTA was negative   Pt was set up for a stress test  I last saw him in 2018      Calcium score  On CT was 339.  Aorta was noted to be mildly dilated at 43 mm   I saw the pt in March 2021 He presented to ED in ec 2021  with CP  Trop negative  CTA negative   Followed up with stress myovue   This showed no ischemia   The pt denies CP   Says his breathing is good   No dizziness   Trying to lose wt   Did well on Optavia  Trying to get diet regulated  Breakfast:   eggs/bacon or oatmeal   Lunch:  Sandwich or salad Dinner:  Museum/gallery conservator   Drinks:  Coffee with stevia, water, sugar free lemonade    Current Meds  Medication Sig   aspirin 81 MG tablet Take 81 mg by mouth daily.   atorvastatin (LIPITOR) 40 MG tablet Take 40 mg by mouth daily.   clobetasol (TEMOVATE) 0.05 % external solution    diazepam (VALIUM) 10 MG tablet Take 10 mg by mouth every 8 (eight) hours as needed for anxiety.   lisinopril (ZESTRIL) 40 MG tablet Take 1 tablet (40 mg total) by mouth daily.   pantoprazole (PROTONIX) 40 MG tablet Take 1 tablet (40 mg total) by mouth daily.   zolpidem (AMBIEN) 10 MG tablet Take 10 mg by mouth at bedtime as needed for sleep.      Allergies:   Patient has no known allergies.   Past Medical History:  Diagnosis Date   Hypercholesteremia    Hypertension    Psoriasis     Past Surgical History:  Procedure Laterality Date   NO PAST SURGERIES       Social History:  The patient  reports that he quit smoking about 40 years  ago. He has quit using smokeless tobacco. He reports current alcohol use. He reports that he does not use drugs.   Family History:  The patient's family history includes Cancer in his mother; Diabetes in his mother; Heart disease in his brother and father.    ROS:  Please see the history of present illness. All other systems are reviewed and  Negative to the above problem except as noted.    PHYSICAL EXAM: VS:  BP 138/80   Pulse 64   Ht 5\' 11"  (1.803 m)   Wt 211 lb 12.8 oz (96.1 kg)   SpO2 96%   BMI 29.54 kg/m   GEN: Overweight 65 yo  in no acute distress  HEENT: normal  Neck: JVP normal;  No carotid bruits Cardiac: RRR; Gr II/Vi syst murmur  No LE  edema  Respiratory:  clear to auscultation bilaterally GI: soft, nontender, nondistended, + BS  No hepatomegaly  MS:  no deformity Moving all extremities   Skin: warm and dry, no rash Neuro:  Strength and sensation are intact Psych: euthymic mood, full affect   EKG:  EKG is not done today    Echo:  05/2019  1. Left ventricular ejection fraction, by estimation, is 60 to 65%. The left ventricle has normal function. The left ventricle has no regional wall motion abnormalities. There is mild concentric left ventricular hypertrophy. Left ventricular diastolic parameters are consistent with Grade I diastolic dysfunction (impaired relaxation). 2. Right ventricular systolic function is normal. The right ventricular size is normal. 3. The mitral valve is grossly normal. No evidence of mitral valve regurgitation. 4. The aortic valve is tricuspid. Aortic valve regurgitation is not visualized. No aortic stenosis is present. 5. The inferior vena cava is normal in size with greater than 50% respiratory variability, suggesting right atrial pressure of 3 mmHg.   01/2020  Stress Myoview  No diagnostic ST segment changes to indicate ischemia. Small, mild intensity, basal inferolateral defect that is partially reversible with summed stress score  actually calculated at 0. This suggests variable diaphragmatic attenuation rather than an ischemic territory. This is a low risk study. Nuclear stress EF: 68%.  Lipid Panel    Component Value Date/Time   CHOL 134 09/29/2016 0737   TRIG 185 (H) 09/29/2016 0737   HDL 26 (L) 09/29/2016 0737   CHOLHDL 5.2 (H) 09/29/2016 0737   VLDL 37 (H) 09/29/2016 0737   LDLCALC 71 09/29/2016 0737      Wt Readings from Last 3 Encounters:  08/21/20 211 lb 12.8 oz (96.1 kg)  02/06/20 220 lb 12.8 oz (100.2 kg)  01/30/20 220 lb (99.8 kg)      ASSESSMENT AND PLAN:  1  CAD  Pt with Ca score  339   No symtpom of angina   Follow   2  HTN PT says his BP is upper 130s or higher  Will check BMET   IF OK would switch to prinizide   40/12.5   4  Aortic dilation   Last CT in Dec 2021 Asc aorta measures 4 cm   Follow   5  Wt  Discussed diet  Watch sugars and carbs    Intermitt fasting      Pan for follow up based on labs and change in meds      4   HL  Continue atorvstatin  LDL 71  Walk  Take at night  Current medicines are reviewed at length with the patient today.  The patient does not have concerns regarding medicines.  Signed, Dorris Carnes, MD  08/21/2020 9:49 PM    [facility] [address], [place], [place]  [address] Phone: [phone]; Fax: [phone]

## 2020-08-21 NOTE — Patient Instructions (Signed)
Medication Instructions:  Your physician recommends that you continue on your current medications as directed. Please refer to the Current Medication list given to you today.  *If you need a refill on your cardiac medications before your next appointment, please call your pharmacy*   Lab Work: NONE   If you have labs (blood work) drawn today and your tests are completely normal, you will receive your results only by: [place] (if you have MyChart) OR A paper copy in the mail If you have any lab test that is abnormal or we need to change your treatment, we will call you to review the results.   Testing/Procedures: NONE    Follow-Up: At [facility], you and your health needs are our priority.  As part of our continuing mission to provide you with exceptional heart care, we have created designated Provider Care Teams.  These Care Teams include your primary Cardiologist (physician) and Advanced Practice Providers (APPs -  Physician Assistants and Nurse Practitioners) who all work together to provide you with the care you need, when you need it.  We recommend signing up for the patient portal called "MyChart".  Sign up information is provided on this After Visit Summary.  MyChart is used to connect with patients for Virtual Visits (Telemedicine).  Patients are able to view lab/test results, encounter notes, upcoming appointments, etc.  Non-urgent messages can be sent to your provider as well.   To learn more about what you can do with MyChart, go to [email or web address].    Your next appointment:    Spring of 2023  The format for your next appointment:   In Person  Provider:   Dorris Carnes, MD   Other Instructions Thank you for choosing [facility]!

## 2020-09-23 ENCOUNTER — Telehealth: Payer: Self-pay | Admitting: Family Medicine

## 2020-09-23 MED ORDER — LISINOPRIL 40 MG PO TABS: 40.0000 mg | ORAL_TABLET | Freq: Every day | ORAL | 2 refills | Status: DC

## 2020-09-23 NOTE — Telephone Encounter (Signed)
Tony called regarding patients lisinopril '40mg'$ . Has medication been discontinued?  [phone]

## 2020-09-23 NOTE — Telephone Encounter (Signed)
Pt needs a refill for 100 day supply per insurance so insurance will cover medication.

## 2020-10-14 DIAGNOSIS — I119 Hypertensive heart disease without heart failure: Secondary | ICD-10-CM | POA: Diagnosis not present

## 2020-10-14 DIAGNOSIS — I10 Essential (primary) hypertension: Secondary | ICD-10-CM | POA: Diagnosis not present

## 2020-10-14 DIAGNOSIS — E782 Mixed hyperlipidemia: Secondary | ICD-10-CM | POA: Diagnosis not present

## 2020-10-14 DIAGNOSIS — R945 Abnormal results of liver function studies: Secondary | ICD-10-CM | POA: Diagnosis not present

## 2020-10-14 DIAGNOSIS — E6609 Other obesity due to excess calories: Secondary | ICD-10-CM | POA: Diagnosis not present

## 2020-10-14 DIAGNOSIS — Z7189 Other specified counseling: Secondary | ICD-10-CM | POA: Diagnosis not present

## 2020-10-22 ENCOUNTER — Other Ambulatory Visit (HOSPITAL_COMMUNITY): Payer: Self-pay | Admitting: Family Medicine

## 2020-10-22 DIAGNOSIS — I119 Hypertensive heart disease without heart failure: Secondary | ICD-10-CM | POA: Diagnosis not present

## 2020-10-22 DIAGNOSIS — I712 Thoracic aortic aneurysm, without rupture, unspecified: Secondary | ICD-10-CM

## 2020-10-22 DIAGNOSIS — E782 Mixed hyperlipidemia: Secondary | ICD-10-CM | POA: Diagnosis not present

## 2020-10-22 DIAGNOSIS — I10 Essential (primary) hypertension: Secondary | ICD-10-CM | POA: Diagnosis not present

## 2020-10-22 DIAGNOSIS — E6609 Other obesity due to excess calories: Secondary | ICD-10-CM | POA: Diagnosis not present

## 2020-11-19 ENCOUNTER — Other Ambulatory Visit: Payer: Self-pay

## 2020-11-19 ENCOUNTER — Ambulatory Visit (HOSPITAL_COMMUNITY)
Admission: RE | Admit: 2020-11-19 | Discharge: 2020-11-19 | Disposition: A | Discharge status: Home / Self Care | Payer: Medicare HMO | Source: Ambulatory Visit | Attending: Family Medicine | Admitting: Family Medicine

## 2020-11-19 DIAGNOSIS — I712 Thoracic aortic aneurysm, without rupture, unspecified: Secondary | ICD-10-CM | POA: Diagnosis not present

## 2020-11-19 DIAGNOSIS — I700 Atherosclerosis of aorta: Secondary | ICD-10-CM | POA: Diagnosis not present

## 2020-11-19 DIAGNOSIS — I7121 Aneurysm of the ascending aorta, without rupture: Secondary | ICD-10-CM | POA: Insufficient documentation

## 2020-11-19 DIAGNOSIS — I2510 Atherosclerotic heart disease of native coronary artery without angina pectoris: Secondary | ICD-10-CM | POA: Insufficient documentation

## 2020-11-19 DIAGNOSIS — R911 Solitary pulmonary nodule: Secondary | ICD-10-CM | POA: Insufficient documentation

## 2020-12-10 ENCOUNTER — Encounter: Payer: Self-pay | Admitting: Gastroenterology

## 2020-12-30 IMAGING — CT CT ANGIO CHEST-ABD-PELV FOR DISSECTION W/ AND WO/W CM
2 of 7 series · 13 of 46 positions shown, 15 images · IV contrast (Omnipaque or Isovue)
Comparison: 09/11/2019

CLINICAL DATA: Left chest pain, bilateral hand and foot numbness

EXAM:
CT ANGIOGRAPHY CHEST, ABDOMEN AND PELVIS
TECHNIQUE: Noncontrast CT through the chest was obtained. Multidetector CT
imaging through the chest, abdomen and pelvis was performed using
the standard protocol during bolus administration of intravenous
contrast. Multiplanar reconstructed images and MIPs were obtained
and reviewed to evaluate the vascular anatomy.
CONTRAST:  100mL OMNIPAQUE IOHEXOL 350 MG/ML SOLN

[Series 4: axial arterial · axial · arterial · 0.76mm/px · z∈[-764,-176]mm · 10 of 230 slices shown, 12 images]
[im 17/230  soft-tissue]
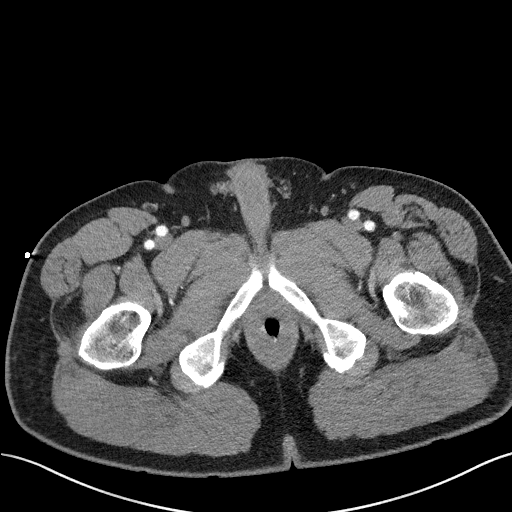
[im 17/230  bone]
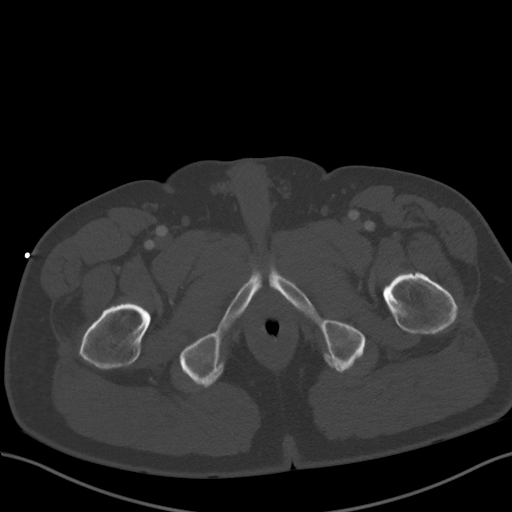
[im 33/230  soft-tissue]
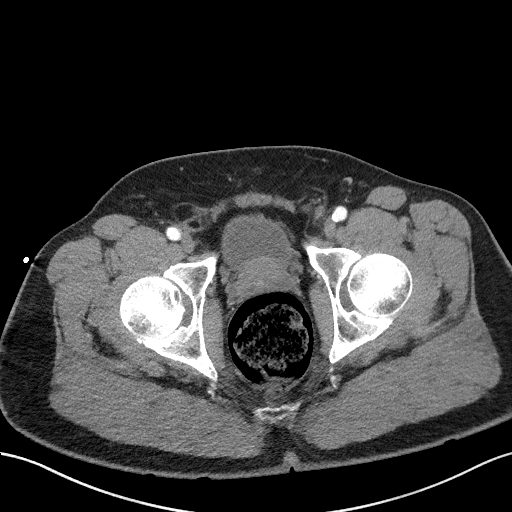
[im 66/230  soft-tissue]
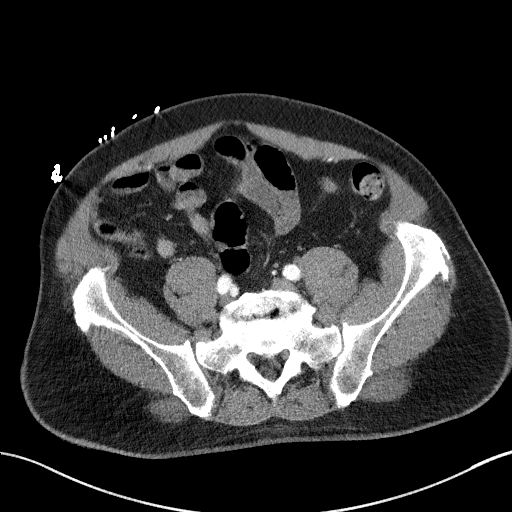
[im 82/230  soft-tissue]
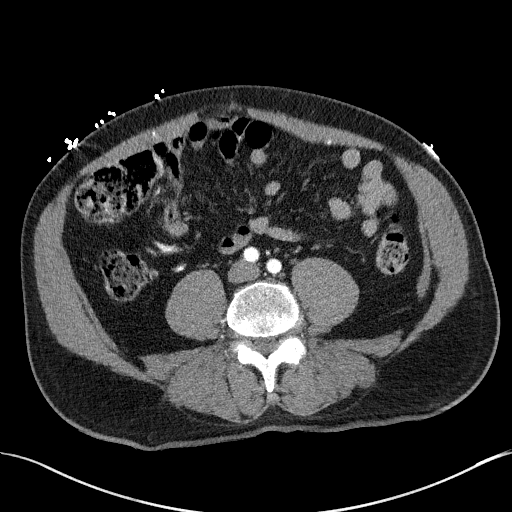
[im 99/230  soft-tissue]
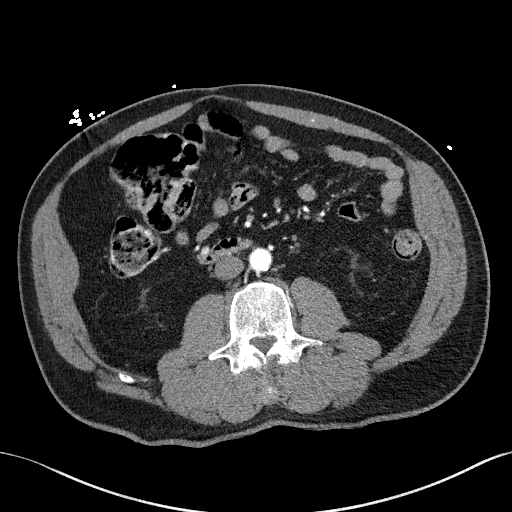
[im 131/230  soft-tissue]
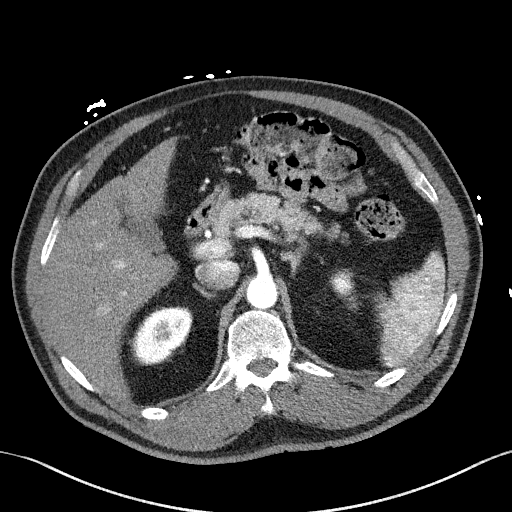
[im 148/230  soft-tissue]
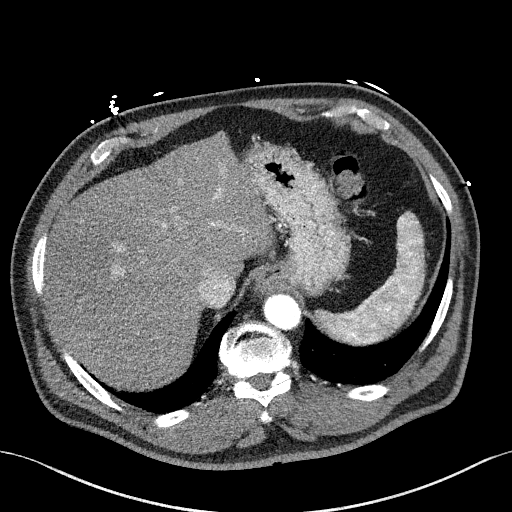
[im 164/230  soft-tissue]
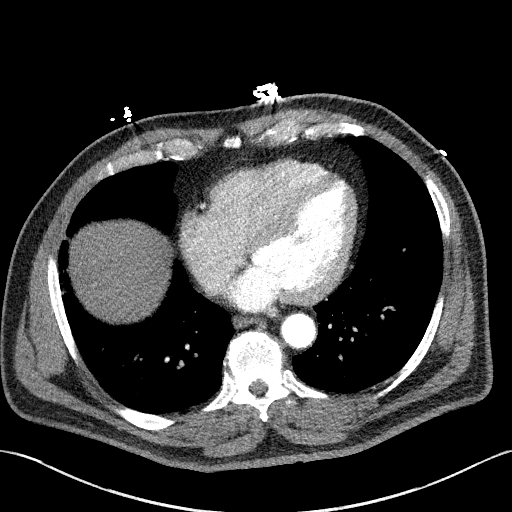
[im 197/230  soft-tissue]
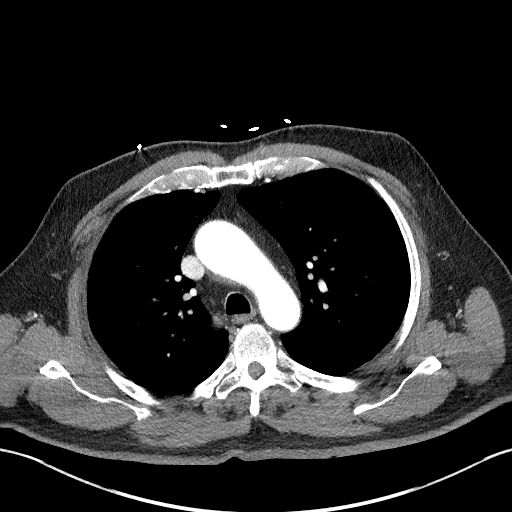
[im 197/230  bone]
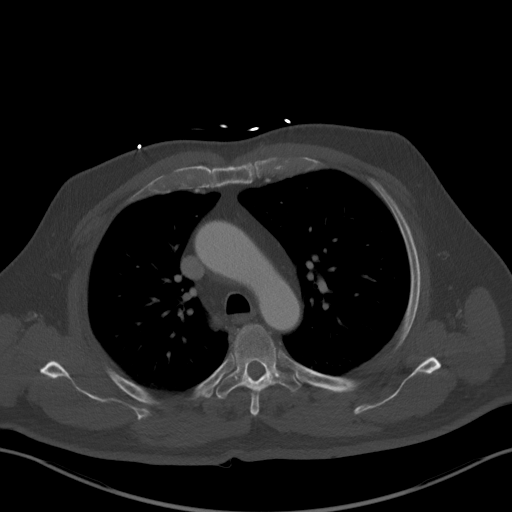
[im 213/230  soft-tissue]
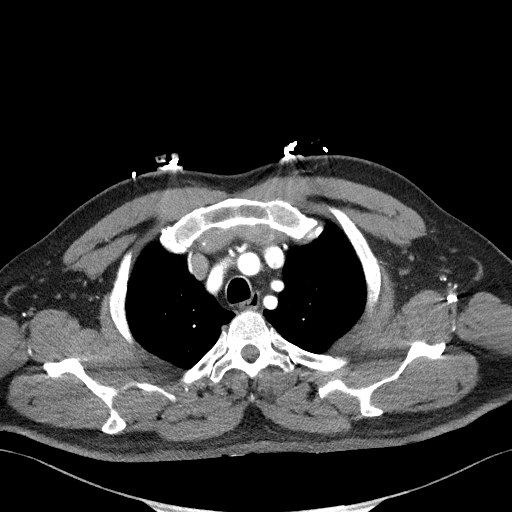

[Series 8: cor soft · coronal · 0.82mm/px · 3 of 153 slices shown]
[im 39/153  soft-tissue]
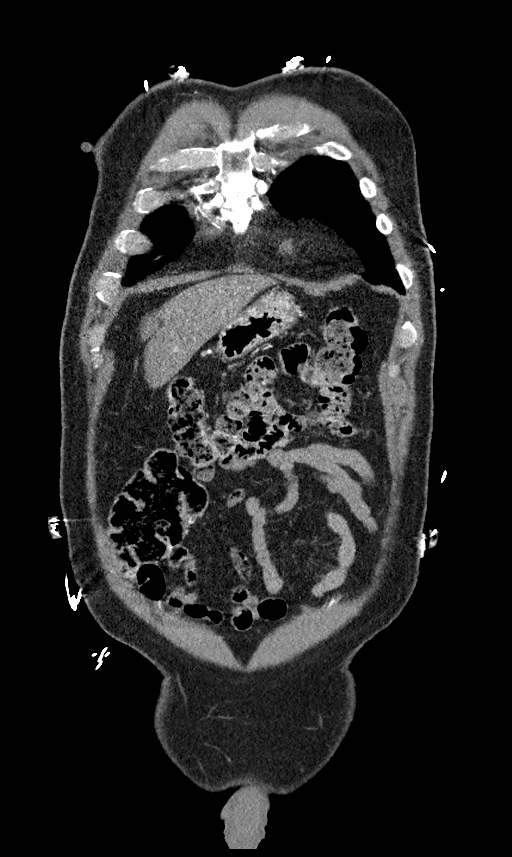
[im 77/153  soft-tissue]
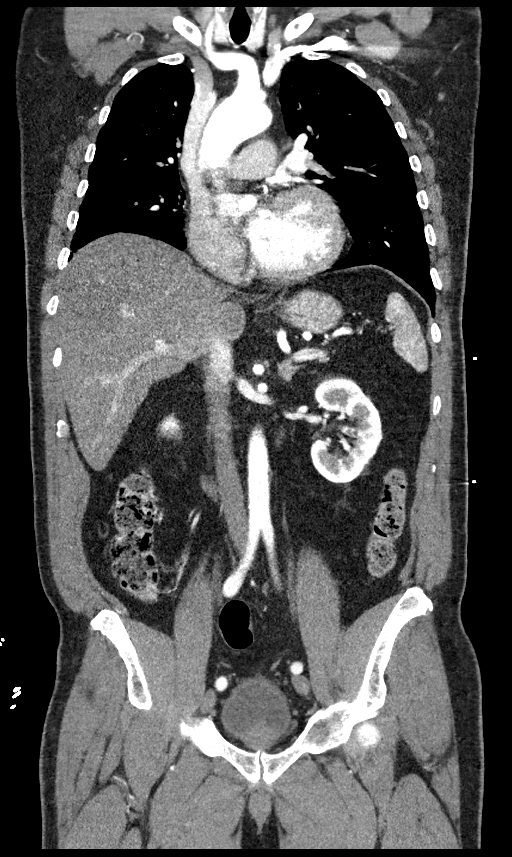
[im 115/153  soft-tissue]
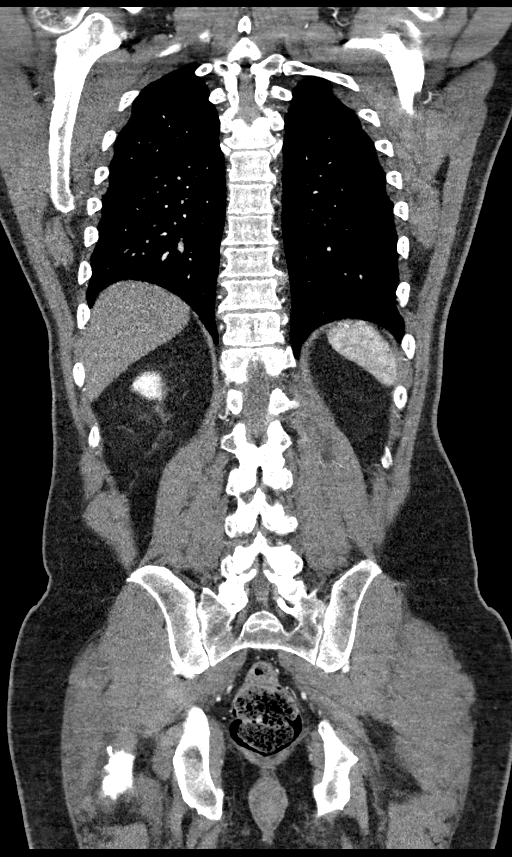

[13 of 46 positions shown; findings below may reference images not displayed]

FINDINGS: CTA CHEST FINDINGS

Cardiovascular: Heart size upper limits normal. No pericardial
effusion. Fair contrast opacification of central pulmonary arteries;
the exam was not optimized for detection of pulmonary emboli.
Scattered coronary calcifications. Good contrast opacification of
the thoracic aorta, with no evidence of dissection or stenosis.

Aortic Root:

--Valve: 2.3 cm

--Sinuses: 4 cm

--Sinotubular Junction: 3.5 cm

Limitations by motion: Moderate

Thoracic Aorta:

--Ascending Aorta: 4 cm (previously 4.1)

--Aortic Arch: 3.5 cm

--Descending Aorta: 2.9 cm

Mild scattered plaque in the descending thoracic aorta.

Bovine variant brachiocephalic arterial origin anatomy without
proximal stenosis.

Mediastinum/Nodes: No mass or adenopathy. Stable 1 cm right
paratracheal node.

Lungs/Pleura: No pleural effusion. No pneumothorax. Minimal
dependent atelectasis posteriorly in both lower lobes. No focal
airspace consolidation or nodule.

Musculoskeletal: No chest wall abnormality. No acute or significant
osseous findings.

Review of the MIP images confirms the above findings.

CTA ABDOMEN AND PELVIS FINDINGS

VASCULAR

Aorta: Minimal atheromatous plaque in the infrarenal segment. No
aneurysm, dissection, or stenosis.

Celiac: Patent without evidence of aneurysm, dissection, vasculitis
or significant stenosis.

SMA: Patent without evidence of aneurysm, dissection, vasculitis or
significant stenosis.

Renals: Both renal arteries are patent without evidence of aneurysm,
dissection, vasculitis, fibromuscular dysplasia or significant
stenosis.

IMA: Patent without evidence of aneurysm, dissection, vasculitis or
significant stenosis.

Inflow: Patent without evidence of aneurysm, dissection, vasculitis
or significant stenosis. Minimal scattered calcified plaque.

Veins: No obvious venous abnormality within the limitations of this
arterial phase study. Portal vein and bilateral renal veins patent.

Review of the MIP images confirms the above findings.

NON-VASCULAR

Hepatobiliary: Fatty liver. No focal lesion or biliary ductal
dilatation. Gallbladder nondistended.

Pancreas: Unremarkable. No pancreatic ductal dilatation or
surrounding inflammatory changes.

Spleen: Normal in size without focal abnormality.

Adrenals/Urinary Tract: Adrenal glands are unremarkable. Kidneys are
normal, without renal calculi, focal lesion, or hydronephrosis.
Bladder is unremarkable.

Stomach/Bowel: Stomach is nondistended. Small bowel is nondilated.
Appendix not discretely identified. No pericecal
inflammatory/edematous change. The colon is nondilated,
unremarkable.

Lymphatic: No abdominal or pelvic adenopathy.

Reproductive: Prostate is unremarkable.

Other: No ascites.  No free air.

Musculoskeletal: Small paraumbilical hernia containing only
mesenteric fat. Advanced degenerative disc disease L5-S1. No
fracture or worrisome bone lesion.

Review of the MIP images confirms the above findings.
IMPRESSION: 1. Negative for acute PE or thoracic aortic dissection.
2. 4 cm ascending aortic aneurysm, previously 4.1. Recommend annual
imaging followup by CTA or MRA. This recommendation follows 9202
ACCF/AHA/AATS/ACR/ASA/SCA/FAUCHER/MOLL/HWARA/WOJCIK Guidelines for the
Diagnosis and Management of Patients with Thoracic Aortic Disease.
Circulation. 9202; 121: e266-e369
3. Coronary and Aortic Atherosclerosis (X1KHD-X5L.L).
4. Fatty liver.
5. Small paraumbilical hernia containing only mesenteric fat.

## 2020-12-30 IMAGING — CT CT HEAD W/O CM
3 series · 16 of 47 positions shown, 19 images · non-contrast
Comparison: None.

CLINICAL DATA: Headaches and left-sided chest pain

EXAM:
CT HEAD WITHOUT CONTRAST
TECHNIQUE: Contiguous axial images were obtained from the base of the skull
through the vertex without intravenous contrast.

[Series 2: head w o · axial · 0.43mm/px · z∈[+12,+147]mm · 10 of 33 slices shown, 13 images]
[im 3/33  brain]
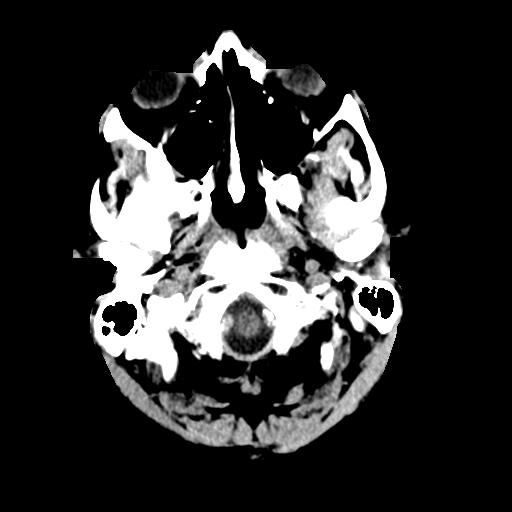
[im 3/33  bone]
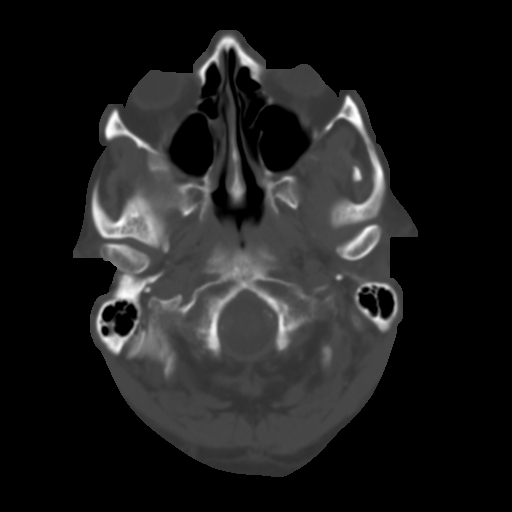
[im 6/33  brain]
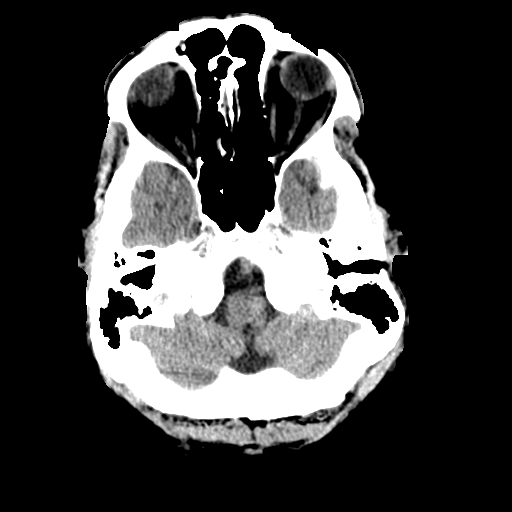
[im 9/33  brain]
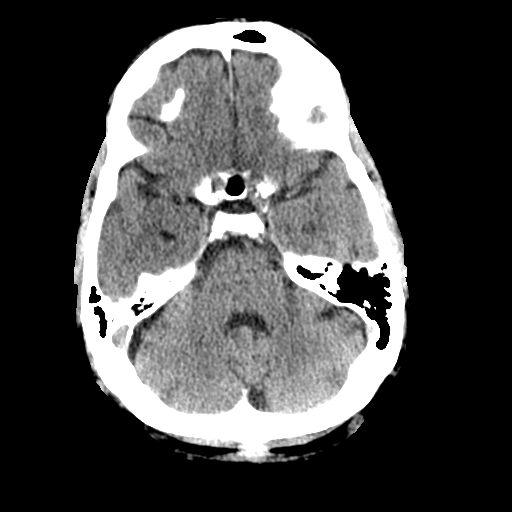
[im 12/33  brain]
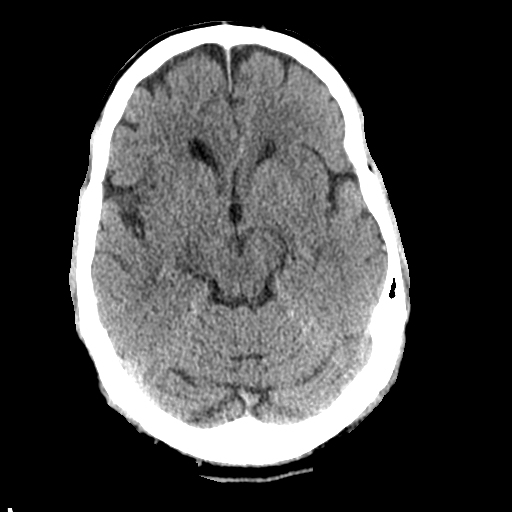
[im 15/33  brain]
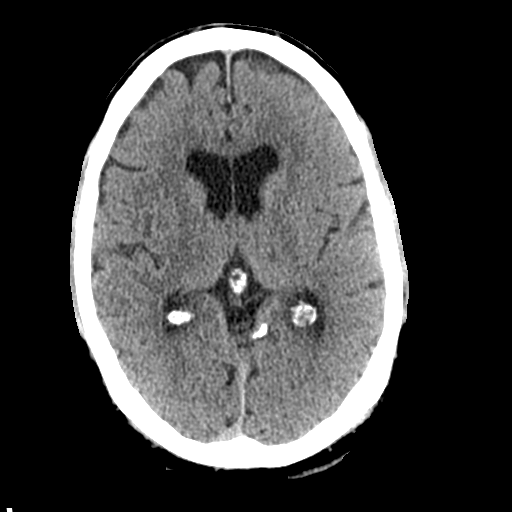
[im 15/33  bone]
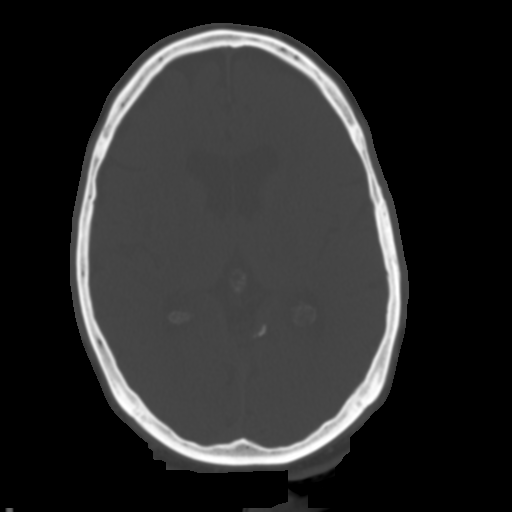
[im 18/33  brain]
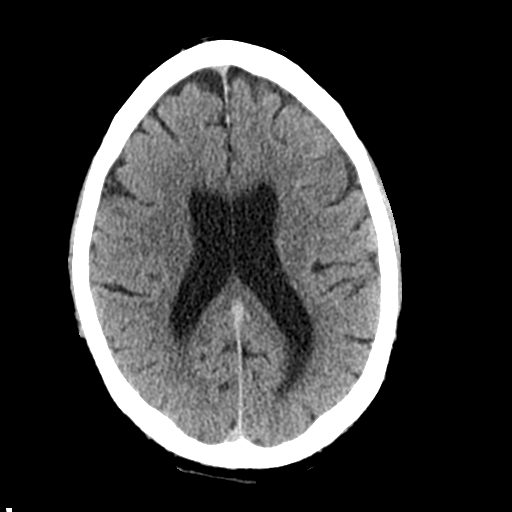
[im 21/33  brain]
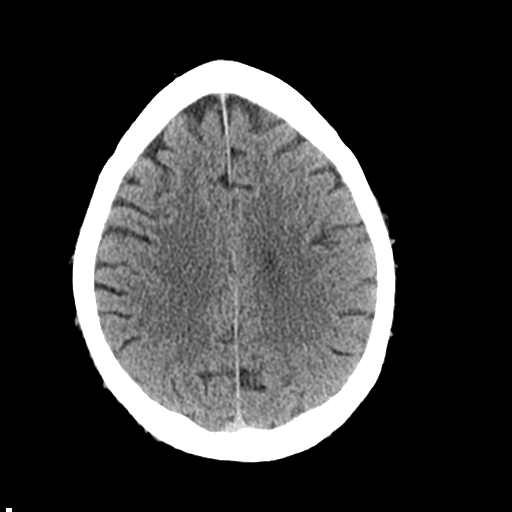
[im 25/33  brain]
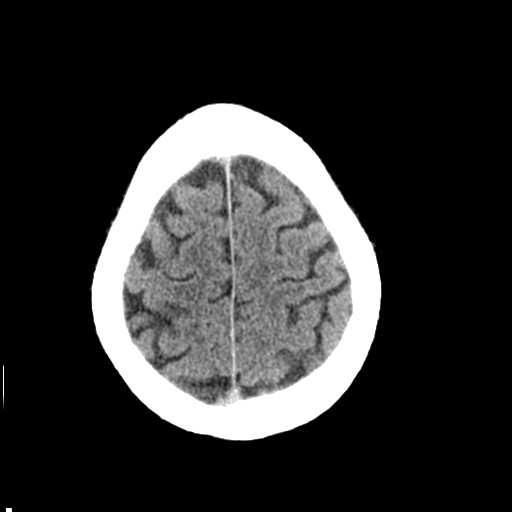
[im 27/33  brain]
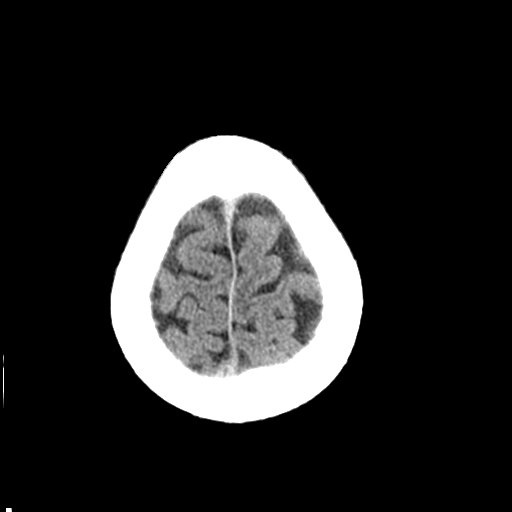
[im 27/33  bone]
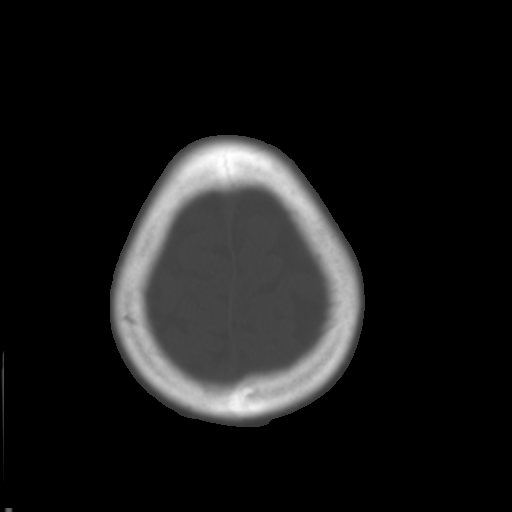
[im 30/33  brain]
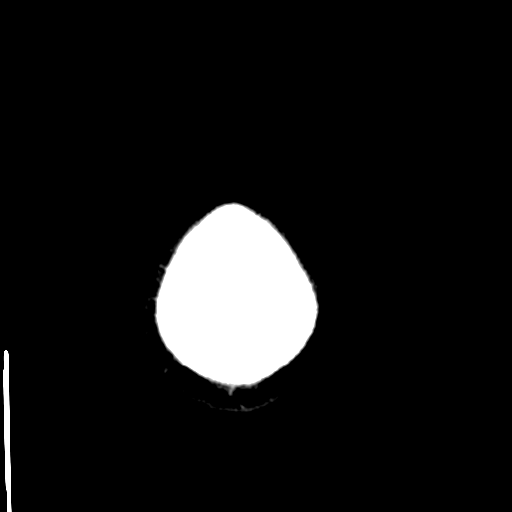

[Series 4: coronal soft · coronal · 0.34mm/px · 3 of 68 slices shown]
[im 23/68  brain]
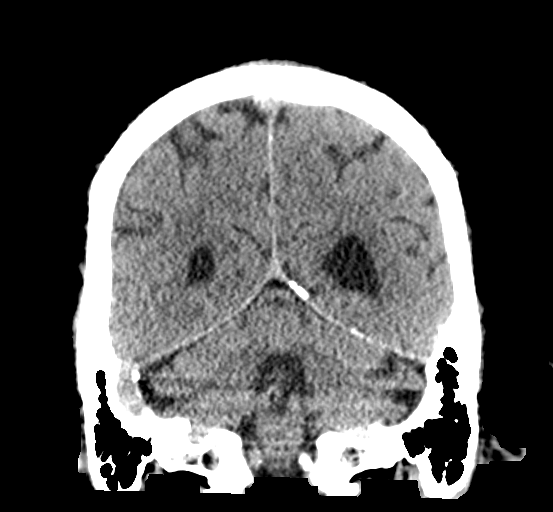
[im 30/68  brain]
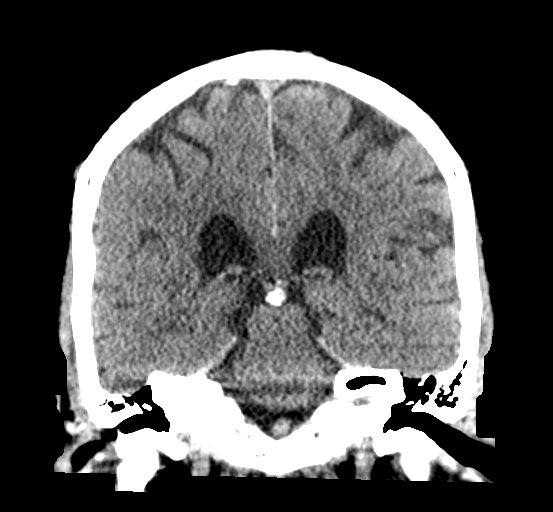
[im 38/68  brain]
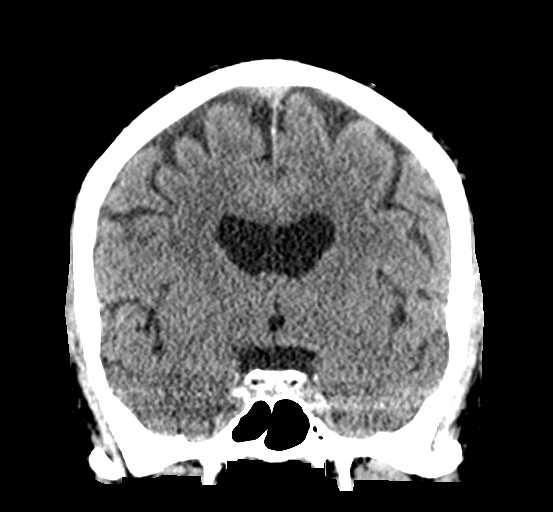

[Series 5: sagittal soft · sagittal · 0.34mm/px · 3 of 62 slices shown]
[im 21/62  brain]
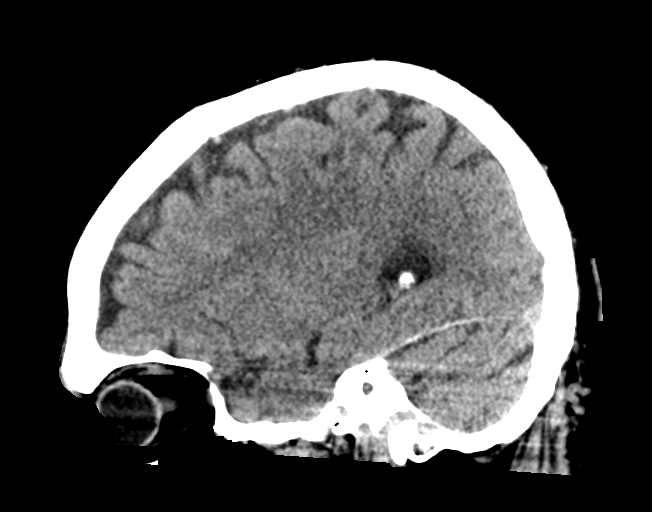
[im 31/62  brain]
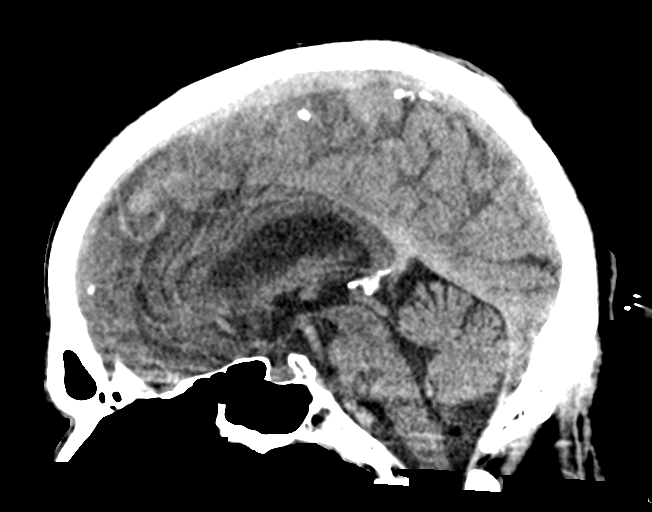
[im 41/62  brain]
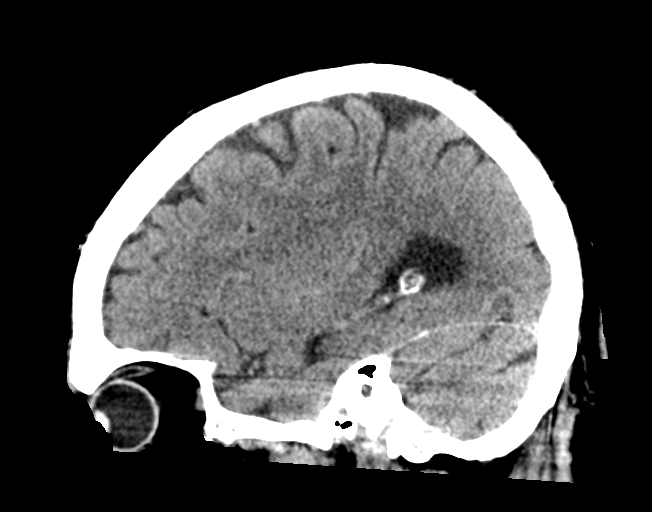

[16 of 47 positions shown; findings below may reference images not displayed]

FINDINGS: Brain: No evidence of acute infarction, hemorrhage, hydrocephalus,
extra-axial collection or mass lesion/mass effect.

Vascular: No hyperdense vessel or unexpected calcification.

Skull: Normal. Negative for fracture or focal lesion.

Sinuses/Orbits: No acute finding.

Other: None.
IMPRESSION: No acute intracranial abnormality noted.

## 2021-01-12 ENCOUNTER — Ambulatory Visit (AMBULATORY_SURGERY_CENTER): Payer: Medicare HMO

## 2021-01-12 ENCOUNTER — Encounter: Payer: Self-pay | Admitting: Gastroenterology

## 2021-01-12 ENCOUNTER — Other Ambulatory Visit: Payer: Self-pay

## 2021-01-12 VITALS — Ht 71.0 in | Wt 206.0 lb

## 2021-01-12 DIAGNOSIS — Z1211 Encounter for screening for malignant neoplasm of colon: Secondary | ICD-10-CM

## 2021-01-12 MED ORDER — CLENPIQ 10-3.5-12 MG-GM -GM/160ML PO SOLN: 1.0000 | ORAL | 0 refills | Status: DC

## 2021-01-12 NOTE — Progress Notes (Signed)
Pre visit completed via phone call; Patient verified name, DOB, and address; No egg or soy allergy known to patient  No issues known to pt with past sedation with any surgeries or procedures Patient denies ever being told they had issues or difficulty with intubation  No FH of Malignant Hyperthermia Pt is not on diet pills Pt is not on home 02  Pt is not on blood thinners  Pt denies issues with constipation at this time; No A fib or A flutter Pt is fully vaccinated for Covid x 2; Coupon given to pt in PV today and NO PA's for preps discussed with pt in PV today  Discussed with pt there will be an out-of-pocket cost for prep and that varies from $0 to 70 +  dollars - pt verbalized understanding  Due to the COVID-19 pandemic we are asking patients to follow certain guidelines in PV and the [facility]   Pt aware of COVID protocols and [facility] guidelines

## 2021-01-26 ENCOUNTER — Ambulatory Visit (AMBULATORY_SURGERY_CENTER): Payer: Medicare HMO | Admitting: Gastroenterology

## 2021-01-26 ENCOUNTER — Encounter: Payer: Self-pay | Admitting: Gastroenterology

## 2021-01-26 VITALS — BP 138/82 | HR 61 | Temp 98.0°F | Resp 12 | Ht 71.0 in | Wt 206.0 lb

## 2021-01-26 DIAGNOSIS — Z1211 Encounter for screening for malignant neoplasm of colon: Secondary | ICD-10-CM | POA: Diagnosis not present

## 2021-01-26 DIAGNOSIS — K641 Second degree hemorrhoids: Secondary | ICD-10-CM

## 2021-01-26 DIAGNOSIS — D125 Benign neoplasm of sigmoid colon: Secondary | ICD-10-CM

## 2021-01-26 DIAGNOSIS — D122 Benign neoplasm of ascending colon: Secondary | ICD-10-CM | POA: Diagnosis not present

## 2021-01-26 MED ORDER — SODIUM CHLORIDE 0.9 % IV SOLN
500.0000 mL | INTRAVENOUS | Status: DC
Start: 2021-01-26 — End: 2021-01-26

## 2021-01-26 NOTE — Progress Notes (Signed)
Called to room to assist during endoscopic procedure.  Patient ID and intended procedure confirmed with present staff. Received instructions for my participation in the procedure from the performing physician.  

## 2021-01-26 NOTE — Progress Notes (Signed)
GASTROENTEROLOGY PROCEDURE H&P NOTE   Primary Care Physician: Lemmie Evens, MD    Reason for Procedure:  Colon Cancer screening  Plan:    Colonoscopy  Patient is appropriate for endoscopic procedure(s) in the ambulatory ([facility]) setting.  The nature of the procedure, as well as the risks, benefits, and alternatives were carefully and thoroughly reviewed with the patient. Ample time for discussion and questions allowed. The patient understood, was satisfied, and agreed to proceed.     HPI: Roger Powers is a 65 y.o. male who presents for colonoscopy for routine Colon Cancer screening.  No active GI symptoms.  No known family history of colon cancer or related malignancy.  Patient is otherwise without complaints or active issues today.  Past Medical History:  Diagnosis Date   Cataract    RIGHT eye only   GERD (gastroesophageal reflux disease)    hx of- with certain foods- on meds   Hypercholesteremia    on meds   Hypertension    on meds   Psoriasis     Past Surgical History:  Procedure Laterality Date   CATARACT EXTRACTION W/ INTRAOCULAR LENS IMPLANT Right 2013   COLONOSCOPY  2004   patient cannot remember where/MD   RETINAL DETACHMENT SURGERY Right 2012   WISDOM TOOTH EXTRACTION      Prior to Admission medications   Medication Sig Start Date End Date Taking? Authorizing Provider  aspirin 81 MG tablet Take 81 mg by mouth daily.   Yes [provider]  atorvastatin (LIPITOR) 40 MG tablet Take 40 mg by mouth daily.   Yes [provider]  clobetasol (TEMOVATE) 0.05 % external solution Apply 1 application topically daily as needed. 01/30/19  Yes [provider]  lisinopril (ZESTRIL) 40 MG tablet Take 1 tablet (40 mg total) by mouth daily. 09/23/20 01/26/21 Yes Verta Ellen., NP  Multiple Vitamins-Minerals (MULTIVITAMIN MEN 50+ PO) Take 1 tablet by mouth daily at 6 (six) AM.   Yes [provider]  OZEMPIC, 0.25 OR 0.5 MG/DOSE, 2  MG/1.5ML SOPN Inject 1 mL as directed once a week. 12/22/20  Yes [provider]  pantoprazole (PROTONIX) 40 MG tablet Take 1 tablet (40 mg total) by mouth daily. 08/30/16  Yes [name], MD  diazepam (VALIUM) 10 MG tablet Take 10 mg by mouth every 8 (eight) hours as needed for anxiety.    [provider]  zolpidem (AMBIEN) 10 MG tablet Take 10 mg by mouth at bedtime as needed for sleep.  04/27/11   [provider]    Current Outpatient Medications  Medication Sig Dispense Refill   aspirin 81 MG tablet Take 81 mg by mouth daily.     atorvastatin (LIPITOR) 40 MG tablet Take 40 mg by mouth daily.     clobetasol (TEMOVATE) 0.05 % external solution Apply 1 application topically daily as needed.     lisinopril (ZESTRIL) 40 MG tablet Take 1 tablet (40 mg total) by mouth daily. 100 tablet 2   Multiple Vitamins-Minerals (MULTIVITAMIN MEN 50+ PO) Take 1 tablet by mouth daily at 6 (six) AM.     OZEMPIC, 0.25 OR 0.5 MG/DOSE, 2 MG/1.5ML SOPN Inject 1 mL as directed once a week.     pantoprazole (PROTONIX) 40 MG tablet Take 1 tablet (40 mg total) by mouth daily. 90 tablet 3   diazepam (VALIUM) 10 MG tablet Take 10 mg by mouth every 8 (eight) hours as needed for anxiety.     zolpidem (AMBIEN) 10  MG tablet Take 10 mg by mouth at bedtime as needed for sleep.      Current Facility-Administered Medications  Medication Dose Route Frequency Provider Last Rate Last Admin   0.9 %  sodium chloride infusion  500 mL Intravenous Continuous Petrice Beedy V, DO        Allergies as of 01/26/2021   (No Known Allergies)    Family History  Problem Relation Age of Onset   Breast cancer Mother    Diabetes Mother    Lung cancer Mother    Heart disease Father    Heart disease Brother    Heart disease Brother    Colon polyps Neg Hx    Colon cancer Neg Hx    Esophageal cancer Neg Hx    Stomach cancer Neg Hx    Rectal cancer Neg Hx     Social History   Socioeconomic History    Marital status: Divorced    Spouse name: Not on file   Number of children: Not on file   Years of education: Not on file   Highest education level: Not on file  Occupational History   Not on file  Tobacco Use   Smoking status: Former    Types: Cigarettes    Quit date: 11/18/1979    Years since quitting: 41.2   Smokeless tobacco: Former  Scientific laboratory technician Use: Never used  Substance and Sexual Activity   Alcohol use: Yes    Alcohol/week: 2.0 standard drinks    Types: 2 Standard drinks or equivalent per week    Comment: rare   Drug use: No   Sexual activity: Not on file  Other Topics Concern   Not on file  Social History Narrative   Not on file   Social Determinants of Health   Financial Resource Strain: Not on file  Food Insecurity: Not on file  Transportation Needs: Not on file  Physical Activity: Not on file  Stress: Not on file  Social Connections: Not on file  Intimate Partner Violence: Not on file    Physical Exam: Vital signs in last 24 hours: @BP  129/71   Pulse 66   Temp 98 F (36.7 C) (Temporal)   Ht 5\' 11"  (1.803 m)   Wt 206 lb (93.4 kg)   SpO2 98%   BMI 28.73 kg/m  GEN: NAD EYE: Sclerae anicteric ENT: MMM CV: Non-tachycardic Pulm: CTA b/l GI: Soft, NT/ND NEURO:  Alert & Oriented x 3   Gerrit Heck, DO [facility]   01/26/2021 11:09 AM

## 2021-01-26 NOTE — Patient Instructions (Signed)
Discharge instructions given. ?Handouts on polyps and Hemorrhoids. ?Resume previous medications. ?YOU HAD AN ENDOSCOPIC PROCEDURE TODAY AT THE [facility]:   Refer to the procedure report that was given to you for any specific questions about what was found during the examination.  If the procedure report does not answer your questions, please call your gastroenterologist to clarify.  If you requested that your care partner not be given the details of your procedure findings, then the procedure report has been included in a sealed envelope for you to review at your convenience later. ? ?YOU SHOULD EXPECT: Some feelings of bloating in the abdomen. Passage of more gas than usual.  Walking can help get rid of the air that was put into your GI tract during the procedure and reduce the bloating. If you had a lower endoscopy (such as a colonoscopy or flexible sigmoidoscopy) you may notice spotting of blood in your stool or on the toilet paper. If you underwent a bowel prep for your procedure, you may not have a normal bowel movement for a few days. ? ?Please Note:  You might notice some irritation and congestion in your nose or some drainage.  This is from the oxygen used during your procedure.  There is no need for concern and it should clear up in a day or so. ? ?SYMPTOMS TO REPORT IMMEDIATELY: ? ?Following lower endoscopy (colonoscopy or flexible sigmoidoscopy): ? Excessive amounts of blood in the stool ? Significant tenderness or worsening of abdominal pains ? Swelling of the abdomen that is new, acute ? Fever of 100?F or higher ? ? ?For urgent or emergent issues, a gastroenterologist can be reached at any hour by calling [phone]. ?Do not use MyChart messaging for urgent concerns.  ? ? ?DIET:  We do recommend a small meal at first, but then you may proceed to your regular diet.  Drink plenty of fluids but you should avoid alcoholic beverages for 24 hours. ? ?ACTIVITY:  You should plan to take it  easy for the rest of today and you should NOT DRIVE or use heavy machinery until tomorrow (because of the sedation medicines used during the test).   ? ?FOLLOW UP: ?Our staff will call the number listed on your records 48-72 hours following your procedure to check on you and address any questions or concerns that you may have regarding the information given to you following your procedure. If we do not reach you, we will leave a message.  We will attempt to reach you two times.  During this call, we will ask if you have developed any symptoms of COVID 19. If you develop any symptoms (ie: fever, flu-like symptoms, shortness of breath, cough etc.) before then, please call [phone].  If you test positive for Covid 19 in the 2 weeks post procedure, please call and report this information to us.   ? ?If any biopsies were taken you will be contacted by phone or by letter within the next 1-3 weeks.  Please call us at [phone] if you have not heard about the biopsies in 3 weeks.  ? ? ?SIGNATURES/CONFIDENTIALITY: ?You and/or your care partner have signed paperwork which will be entered into your electronic medical record.  These signatures attest to the fact that that the information above on your After Visit Summary has been reviewed and is understood.  Full responsibility of the confidentiality of this discharge information lies with you and/or your care-partner.  ?

## 2021-01-26 NOTE — Progress Notes (Signed)
Report to PACU, RN, vss, BBS= Clear.  

## 2021-01-26 NOTE — Progress Notes (Signed)
Pt's states no medical or surgical changes since previsit or office visit. 

## 2021-01-26 NOTE — Op Note (Signed)
[facility] Patient Name: Roger Powers Procedure Date: 01/26/2021 11:02 AM MRN: [id number] Endoscopist: Gerrit Heck , MD Age: 65 Referring MD:  Date of Birth: November 11, 1955 Gender: Male Account #: [id number] Procedure:                Colonoscopy Indications:              Screening for colorectal malignant neoplasm (last                            colonoscopy was more than 10 years ago) Medicines:                Monitored Anesthesia Care Procedure:                Pre-Anesthesia Assessment:                           - Prior to the procedure, a History and Physical                            was performed, and patient medications and                            allergies were reviewed. The patient's tolerance of                            previous anesthesia was also reviewed. The risks                            and benefits of the procedure and the sedation                            options and risks were discussed with the patient.                            All questions were answered, and informed consent                            was obtained. Prior Anticoagulants: The patient has                            taken no previous anticoagulant or antiplatelet                            agents. ASA Grade Assessment: II - A patient with                            mild systemic disease. After reviewing the risks                            and benefits, the patient was deemed in                            satisfactory condition to undergo the procedure.  After obtaining informed consent, the colonoscope                            was passed under direct vision. Throughout the                            procedure, the patient's blood pressure, pulse, and                            oxygen saturations were monitored continuously. The                            Olympus CF-HQ190L (Serial# [id number]) Colonoscope was                            introduced through the  anus and advanced to the the                            cecum, identified by appendiceal orifice and                            ileocecal valve. The colonoscopy was performed                            without difficulty. The patient tolerated the                            procedure well. The quality of the bowel                            preparation was good. The ileocecal valve,                            appendiceal orifice, and rectum were photographed. Scope In: 11:15:01 AM Scope Out: 11:33:31 AM Scope Withdrawal Time: 0 hours 11 minutes 1 second  Total Procedure Duration: 0 hours 18 minutes 30 seconds  Findings:                 The perianal and digital rectal examinations were                            normal.                           Two sessile polyps were found in the sigmoid colon                            and ascending colon. The polyps were 4 to 5 mm in                            size. These polyps were removed with a cold snare.                            Resection and retrieval were complete. Estimated  blood loss was minimal.                           Non-bleeding internal hemorrhoids were found during                            retroflexion. The hemorrhoids were medium-sized. Complications:            No immediate complications. Estimated Blood Loss:     Estimated blood loss was minimal. Impression:               - Two 4 to 5 mm polyps in the sigmoid colon and in                            the ascending colon, removed with a cold snare.                            Resected and retrieved.                           - Non-bleeding internal hemorrhoids. Recommendation:           - Patient has a contact number available for                            emergencies. The signs and symptoms of potential                            delayed complications were discussed with the                            patient. Return to normal activities tomorrow.                             Written discharge instructions were provided to the                            patient.                           - Resume previous diet.                           - Continue present medications.                           - Await pathology results.                           - Repeat colonoscopy for surveillance based on                            pathology results.                           - Return to GI clinic PRN.                           -  Use fiber, for example Citrucel, Fibercon, Konsyl                            or Metamucil.                           - Internal hemorrhoids were noted on this study and                            may be amenable to hemorrhoid band ligation. If you                            are interested in further treatment of these                            hemorrhoids with band ligation, please contact my                            clinic to set up an appointment for evaluation and                            treatment. Gerrit Heck, MD 01/26/2021 11:46:30 AM

## 2021-01-28 ENCOUNTER — Telehealth: Payer: Self-pay | Admitting: *Deleted

## 2021-01-28 DIAGNOSIS — Z8042 Family history of malignant neoplasm of prostate: Secondary | ICD-10-CM | POA: Diagnosis not present

## 2021-01-28 DIAGNOSIS — E6609 Other obesity due to excess calories: Secondary | ICD-10-CM | POA: Diagnosis not present

## 2021-01-28 DIAGNOSIS — E782 Mixed hyperlipidemia: Secondary | ICD-10-CM | POA: Diagnosis not present

## 2021-01-28 DIAGNOSIS — G4700 Insomnia, unspecified: Secondary | ICD-10-CM | POA: Diagnosis not present

## 2021-01-28 DIAGNOSIS — Z23 Encounter for immunization: Secondary | ICD-10-CM | POA: Diagnosis not present

## 2021-01-28 NOTE — Telephone Encounter (Signed)
°  Follow up Call-  Call back number 01/26/2021  Post procedure Call Back phone  # [phone]  Permission to leave phone message Yes  Some recent data might be hidden     Patient questions:  Do you have a fever, pain , or abdominal swelling? No. Pain Score  0 *  Have you tolerated food without any problems? Yes.    Have you been able to return to your normal activities? Yes.    Do you have any questions about your discharge instructions: Diet   No. Medications  No. Follow up visit  No.  Do you have questions or concerns about your Care? No.  Actions: * If pain score is 4 or above: No action needed, pain <4.  Have you developed a fever since your procedure? no  2.   Have you had an respiratory symptoms (SOB or cough) since your procedure? no  3.   Have you tested positive for COVID 19 since your procedure no  4.   Have you had any family members/close contacts diagnosed with the COVID 19 since your procedure?  no   If yes to any of these questions please route to Joylene John, RN and Joella Prince, RN

## 2021-02-03 DIAGNOSIS — Z125 Encounter for screening for malignant neoplasm of prostate: Secondary | ICD-10-CM | POA: Diagnosis not present

## 2021-02-03 DIAGNOSIS — I10 Essential (primary) hypertension: Secondary | ICD-10-CM | POA: Diagnosis not present

## 2021-02-12 ENCOUNTER — Encounter: Payer: Self-pay | Admitting: Gastroenterology

## 2021-04-02 DIAGNOSIS — Z20822 Contact with and (suspected) exposure to covid-19: Secondary | ICD-10-CM | POA: Diagnosis not present

## 2021-04-02 DIAGNOSIS — Z03818 Encounter for observation for suspected exposure to other biological agents ruled out: Secondary | ICD-10-CM | POA: Diagnosis not present

## 2021-07-29 DIAGNOSIS — E6609 Other obesity due to excess calories: Secondary | ICD-10-CM | POA: Diagnosis not present

## 2021-07-29 DIAGNOSIS — G4700 Insomnia, unspecified: Secondary | ICD-10-CM | POA: Diagnosis not present

## 2021-07-29 DIAGNOSIS — F419 Anxiety disorder, unspecified: Secondary | ICD-10-CM | POA: Diagnosis not present

## 2021-07-29 DIAGNOSIS — E782 Mixed hyperlipidemia: Secondary | ICD-10-CM | POA: Diagnosis not present

## 2021-10-26 ENCOUNTER — Other Ambulatory Visit: Payer: Self-pay

## 2021-10-26 MED ORDER — LISINOPRIL 40 MG PO TABS: 40.0000 mg | ORAL_TABLET | Freq: Every day | ORAL | 1 refills | Status: DC

## 2021-12-16 DIAGNOSIS — L918 Other hypertrophic disorders of the skin: Secondary | ICD-10-CM | POA: Diagnosis not present

## 2021-12-16 DIAGNOSIS — L400 Psoriasis vulgaris: Secondary | ICD-10-CM | POA: Diagnosis not present

## 2021-12-16 DIAGNOSIS — L820 Inflamed seborrheic keratosis: Secondary | ICD-10-CM | POA: Diagnosis not present

## 2021-12-16 DIAGNOSIS — L821 Other seborrheic keratosis: Secondary | ICD-10-CM | POA: Diagnosis not present

## 2021-12-16 DIAGNOSIS — D225 Melanocytic nevi of trunk: Secondary | ICD-10-CM | POA: Diagnosis not present

## 2021-12-16 DIAGNOSIS — L814 Other melanin hyperpigmentation: Secondary | ICD-10-CM | POA: Diagnosis not present

## 2021-12-16 DIAGNOSIS — L538 Other specified erythematous conditions: Secondary | ICD-10-CM | POA: Diagnosis not present

## 2021-12-17 DIAGNOSIS — L400 Psoriasis vulgaris: Secondary | ICD-10-CM | POA: Diagnosis not present

## 2022-01-18 NOTE — H&P (View-Only) (Signed)
Cardiology Office Note   Date:  01/19/2022   ID:  Roger Powers, Roger Powers [birth year], MRN [id number]  PCP:  Lemmie Evens, MD  Cardiologist:   Dorris Carnes, MD   Pt returns for f/u of CP and CAD      History of Present Illness: Roger Powers is a 66 y.o. male with a history of atypical CP, CAD by CT scan The pt presented to ED in Dec 2021 with CP   Trop were negative  CTA was negative   Pt was set up for a stress test  I last saw him in 2018      Calcium score  On CT was 339.  Aorta was noted to be mildly dilated at 43 mm Stress myoview without ischemia   I saw the pt in July 2022  SInce seen he says he is a little SOB at times   Thinks it is getting worse   Denies CP   No wheezing      Current Meds  Medication Sig   aspirin 81 MG tablet Take 81 mg by mouth daily.   atorvastatin (LIPITOR) 40 MG tablet Take 40 mg by mouth daily.   clobetasol (TEMOVATE) 0.05 % external solution Apply 1 application topically daily as needed.   diazepam (VALIUM) 10 MG tablet Take 10 mg by mouth every 8 (eight) hours as needed for anxiety.   hydrochlorothiazide (MICROZIDE) 12.5 MG capsule Take 1 capsule (12.5 mg total) by mouth daily.   lisinopril (ZESTRIL) 40 MG tablet Take 1 tablet (40 mg total) by mouth daily.   metoprolol tartrate (LOPRESSOR) 50 MG tablet TAKE ONE TABLET (50 MG) BY MOUTH 2 HOURS PRIOR TO YOUR CARDIAC CT.   Multiple Vitamins-Minerals (MULTIVITAMIN MEN 50+ PO) Take 1 tablet by mouth daily at 6 (six) AM.   pantoprazole (PROTONIX) 40 MG tablet Take 1 tablet (40 mg total) by mouth daily.   zolpidem (AMBIEN) 10 MG tablet Take 10 mg by mouth at bedtime as needed for sleep.      Allergies:   Patient has no known allergies.   Past Medical History:  Diagnosis Date   Cataract    RIGHT eye only   GERD (gastroesophageal reflux disease)    hx of- with certain foods- on meds   Hypercholesteremia    on meds   Hypertension    on meds   Psoriasis     Past Surgical History:  Procedure  Laterality Date   CATARACT EXTRACTION W/ INTRAOCULAR LENS IMPLANT Right 2013   COLONOSCOPY  2004   patient cannot remember where/MD   RETINAL DETACHMENT SURGERY Right 2012   WISDOM TOOTH EXTRACTION       Social History:  The patient  reports that he quit smoking about 42 years ago. His smoking use included cigarettes. He has quit using smokeless tobacco. He reports current alcohol use of about 2.0 standard drinks of alcohol per week. He reports that he does not use drugs.   Family History:  The patient's family history includes Breast cancer in his mother; Diabetes in his mother; Heart disease in his brother, brother, and father; Lung cancer in his mother.    ROS:  Please see the history of present illness. All other systems are reviewed and  Negative to the above problem except as noted.    PHYSICAL EXAM: VS:  BP (!) 148/82   Pulse 75   Ht '5\' 11"'$  (1.803 m)   Wt 229 lb 6.4 oz (104.1 kg)  SpO2 96%   BMI 31.99 kg/m   GEN: Overweight 66 yo  in no acute distress  HEENT: normal  Neck: JVP normal;  No carotid bruit Cardiac: RRR; Gr II/Vi syst murmur  No LE  edema  Respiratory:  clear to auscultation bilaterally GI: soft, nontender, nondistended, + BS  No hepatomegaly  MS: no deformity Moving all extremities   Skin: warm and dry, no rash Neuro:  Strength and sensation are intact Psych: euthymic mood, full affect   EKG:  EKG hows SR 75 bpm   RBBB    Echo:  05/2019  1. Left ventricular ejection fraction, by estimation, is 60 to 65%. The left ventricle has normal function. The left ventricle has no regional wall motion abnormalities. There is mild concentric left ventricular hypertrophy. Left ventricular diastolic parameters are consistent with Grade I diastolic dysfunction (impaired relaxation). 2. Right ventricular systolic function is normal. The right ventricular size is normal. 3. The mitral valve is grossly normal. No evidence of mitral valve regurgitation. 4. The aortic  valve is tricuspid. Aortic valve regurgitation is not visualized. No aortic stenosis is present. 5. The inferior vena cava is normal in size with greater than 50% respiratory variability, suggesting right atrial pressure of 3 mmHg.   01/2020  Stress Myoview  No diagnostic ST segment changes to indicate ischemia. Small, mild intensity, basal inferolateral defect that is partially reversible with summed stress score actually calculated at 0. This suggests variable diaphragmatic attenuation rather than an ischemic territory. This is a low risk study. Nuclear stress EF: 68%.  Lipid Panel    Component Value Date/Time   CHOL 134 09/29/2016 0737   TRIG 185 (H) 09/29/2016 0737   HDL 26 (L) 09/29/2016 0737   CHOLHDL 5.2 (H) 09/29/2016 0737   VLDL 37 (H) 09/29/2016 0737   LDLCALC 71 09/29/2016 0737      Wt Readings from Last 3 Encounters:  01/19/22 229 lb 6.4 oz (104.1 kg)  01/26/21 206 lb (93.4 kg)  01/12/21 206 lb (93.4 kg)      ASSESSMENT AND PLAN:  1  CAD  Pt with Ca score  339   Normal myoview in past   Now with increased SOB with exertion   Will set up for a CTA of heart      2  HTN  Pt is on lisinopril 40   Will add HCTZ    12.5 mg   Follow   4  Aortic dilation  Follow on CT     5  Wt   Reviewed diet   Limit carbs      4   HL  Continue atorvstati  Will get lipomed  Check lipomed, Lpa, A1C and BMET     Current medicines are reviewed at length with the patient today.  The patient does not have concerns regarding medicines.  Signed, Dorris Carnes, MD  01/19/2022 11:17 PM    [facility] [place], [place], [place]  [address] Phone: [phone]; Fax: [phone]

## 2022-01-18 NOTE — Progress Notes (Unsigned)
Cardiology Office Note   Date:  01/19/2022   ID:  Roger Powers, Roger Powers 10/05/1955, MRN [id number]  PCP:  Lemmie Evens, MD  Cardiologist:   Dorris Carnes, MD   Pt returns for f/u of CP and CAD      History of Present Illness: Roger Powers is a 66 y.o. male with a history of atypical CP, CAD by CT scan The pt presented to ED in Dec 2021 with CP   Trop were negative  CTA was negative   Pt was set up for a stress test  I last saw him in 2018      Calcium score  On CT was 339.  Aorta was noted to be mildly dilated at 43 mm Stress myoview without ischemia   I saw the pt in July 2022  SInce seen he says he is a little SOB at times   Thinks it is getting worse   Denies CP   No wheezing      Current Meds  Medication Sig   aspirin 81 MG tablet Take 81 mg by mouth daily.   atorvastatin (LIPITOR) 40 MG tablet Take 40 mg by mouth daily.   clobetasol (TEMOVATE) 0.05 % external solution Apply 1 application topically daily as needed.   diazepam (VALIUM) 10 MG tablet Take 10 mg by mouth every 8 (eight) hours as needed for anxiety.   hydrochlorothiazide (MICROZIDE) 12.5 MG capsule Take 1 capsule (12.5 mg total) by mouth daily.   lisinopril (ZESTRIL) 40 MG tablet Take 1 tablet (40 mg total) by mouth daily.   metoprolol tartrate (LOPRESSOR) 50 MG tablet TAKE ONE TABLET (50 MG) BY MOUTH 2 HOURS PRIOR TO YOUR CARDIAC CT.   Multiple Vitamins-Minerals (MULTIVITAMIN MEN 50+ PO) Take 1 tablet by mouth daily at 6 (six) AM.   pantoprazole (PROTONIX) 40 MG tablet Take 1 tablet (40 mg total) by mouth daily.   zolpidem (AMBIEN) 10 MG tablet Take 10 mg by mouth at bedtime as needed for sleep.      Allergies:   Patient has no known allergies.   Past Medical History:  Diagnosis Date   Cataract    RIGHT eye only   GERD (gastroesophageal reflux disease)    hx of- with certain foods- on meds   Hypercholesteremia    on meds   Hypertension    on meds   Psoriasis     Past Surgical History:  Procedure  Laterality Date   CATARACT EXTRACTION W/ INTRAOCULAR LENS IMPLANT Right 2013   COLONOSCOPY  2004   patient cannot remember where/MD   RETINAL DETACHMENT SURGERY Right 2012   WISDOM TOOTH EXTRACTION       Social History:  The patient  reports that he quit smoking about 42 years ago. His smoking use included cigarettes. He has quit using smokeless tobacco. He reports current alcohol use of about 2.0 standard drinks of alcohol per week. He reports that he does not use drugs.   Family History:  The patient's family history includes Breast cancer in his mother; Diabetes in his mother; Heart disease in his brother, brother, and father; Lung cancer in his mother.    ROS:  Please see the history of present illness. All other systems are reviewed and  Negative to the above problem except as noted.    PHYSICAL EXAM: VS:  BP (!) 148/82   Pulse 75   Ht '5\' 11"'$  (1.803 m)   Wt 229 lb 6.4 oz (104.1 kg)  SpO2 96%   BMI 31.99 kg/m   GEN: Overweight 66 yo  in no acute distress  HEENT: normal  Neck: JVP normal;  No carotid bruit Cardiac: RRR; Gr II/Vi syst murmur  No LE  edema  Respiratory:  clear to auscultation bilaterally GI: soft, nontender, nondistended, + BS  No hepatomegaly  MS: no deformity Moving all extremities   Skin: warm and dry, no rash Neuro:  Strength and sensation are intact Psych: euthymic mood, full affect   EKG:  EKG hows SR 75 bpm   RBBB    Echo:  05/2019  1. Left ventricular ejection fraction, by estimation, is 60 to 65%. The left ventricle has normal function. The left ventricle has no regional wall motion abnormalities. There is mild concentric left ventricular hypertrophy. Left ventricular diastolic parameters are consistent with Grade I diastolic dysfunction (impaired relaxation). 2. Right ventricular systolic function is normal. The right ventricular size is normal. 3. The mitral valve is grossly normal. No evidence of mitral valve regurgitation. 4. The aortic  valve is tricuspid. Aortic valve regurgitation is not visualized. No aortic stenosis is present. 5. The inferior vena cava is normal in size with greater than 50% respiratory variability, suggesting right atrial pressure of 3 mmHg.   01/2020  Stress Myoview  No diagnostic ST segment changes to indicate ischemia. Small, mild intensity, basal inferolateral defect that is partially reversible with summed stress score actually calculated at 0. This suggests variable diaphragmatic attenuation rather than an ischemic territory. This is a low risk study. Nuclear stress EF: 68%.  Lipid Panel    Component Value Date/Time   CHOL 134 09/29/2016 0737   TRIG 185 (H) 09/29/2016 0737   HDL 26 (L) 09/29/2016 0737   CHOLHDL 5.2 (H) 09/29/2016 0737   VLDL 37 (H) 09/29/2016 0737   LDLCALC 71 09/29/2016 0737      Wt Readings from Last 3 Encounters:  01/19/22 229 lb 6.4 oz (104.1 kg)  01/26/21 206 lb (93.4 kg)  01/12/21 206 lb (93.4 kg)      ASSESSMENT AND PLAN:  1  CAD  Pt with Ca score  339   Normal myoview in past   Now with increased SOB with exertion   Will set up for a CTA of heart      2  HTN  Pt is on lisinopril 40   Will add HCTZ    12.5 mg   Follow   4  Aortic dilation  Follow on CT     5  Wt   Reviewed diet   Limit carbs      4   HL  Continue atorvstati  Will get lipomed  Check lipomed, Lpa, A1C and BMET     Current medicines are reviewed at length with the patient today.  The patient does not have concerns regarding medicines.  Signed, Dorris Carnes, MD  01/19/2022 11:17 PM    [facility] [place], [place], [place]  [address] Phone: [phone]; Fax: [phone]

## 2022-01-19 ENCOUNTER — Ambulatory Visit: Payer: Medicare HMO | Attending: Internal Medicine | Admitting: Internal Medicine

## 2022-01-19 ENCOUNTER — Telehealth: Payer: Self-pay

## 2022-01-19 ENCOUNTER — Encounter: Payer: Self-pay | Admitting: Internal Medicine

## 2022-01-19 VITALS — BP 148/82 | HR 75 | Ht 71.0 in | Wt 229.4 lb

## 2022-01-19 DIAGNOSIS — I2510 Atherosclerotic heart disease of native coronary artery without angina pectoris: Secondary | ICD-10-CM | POA: Diagnosis not present

## 2022-01-19 DIAGNOSIS — I10 Essential (primary) hypertension: Secondary | ICD-10-CM

## 2022-01-19 DIAGNOSIS — Z79899 Other long term (current) drug therapy: Secondary | ICD-10-CM

## 2022-01-19 DIAGNOSIS — K760 Fatty (change of) liver, not elsewhere classified: Secondary | ICD-10-CM | POA: Diagnosis not present

## 2022-01-19 DIAGNOSIS — R0602 Shortness of breath: Secondary | ICD-10-CM | POA: Diagnosis not present

## 2022-01-19 DIAGNOSIS — E782 Mixed hyperlipidemia: Secondary | ICD-10-CM | POA: Diagnosis not present

## 2022-01-19 MED ORDER — HYDROCHLOROTHIAZIDE 12.5 MG PO CAPS: 12.5000 mg | ORAL_CAPSULE | Freq: Every day | ORAL | 3 refills | Status: DC

## 2022-01-19 MED ORDER — METOPROLOL TARTRATE 50 MG PO TABS: ORAL_TABLET | ORAL | 0 refills | Status: DC

## 2022-01-19 NOTE — Patient Instructions (Signed)
Medication Instructions:  HCTZ 12.5 MG DAILY  *If you need a refill on your cardiac medications before your next appointment, please call your pharmacy*   Lab Work: BMET, HGBA1C, NMR, APO B, LIPO A IN 2 WEEKS AT YOUR PCP OFFICE  If you have labs (blood work) drawn today and your tests are completely normal, you will receive your results only by: [place] (if you have MyChart) OR A paper copy in the mail If you have any lab test that is abnormal or we need to change your treatment, we will call you to review the results.   Testing/Procedures:   Your cardiac CT will be scheduled at one of the below locations:   [facility] [address] [place], [place] [address] [phone] [address] [place], [place] [address] [phone]  [facility] [place], [place] [address] [phone]  If scheduled at [facility], please arrive at the [facility] Entrance (Entrance C2) of [facility] 30 minutes prior to test start time. You can use the FREE valet parking offered at entrance C (encouraged to control the heart rate for the test)  Proceed to the [facility] Radiology Department (first floor) to check-in and test prep.  All radiology patients and guests should use entrance C2 at [facility], accessed from [facility], even though the hospital's physical address listed is [address].    If scheduled at [facility] or [facility], please arrive 15 mins early for check-in and test prep.   Please follow these instructions carefully (unless otherwise directed):  Hold all erectile dysfunction medications at least 3 days (72 hrs) prior to test. (Ie viagra, cialis, sildenafil, tadalafil, etc) We will administer nitroglycerin during this exam.   On the  Night Before the Test: Be sure to Drink plenty of water. Do not consume any caffeinated/decaffeinated beverages or chocolate 12 hours prior to your test. Do not take any antihistamines 12 hours prior to your test.  On the Day of the Test: Drink plenty of water until 1 hour prior to the test. Do not eat any food 1 hour prior to test. You may take your regular medications prior to the test.  Take metoprolol (Lopressor) two hours prior to test. HOLD Furosemide/Hydrochlorothiazide morning of the test.       After the Test: Drink plenty of water. After receiving IV contrast, you may experience a mild flushed feeling. This is normal. On occasion, you may experience a mild rash up to 24 hours after the test. This is not dangerous. If this occurs, you can take Benadryl 25 mg and increase your fluid intake. If you experience trouble breathing, this can be serious. If it is severe call 911 IMMEDIATELY. If it is mild, please call our office. IWe will call to schedule your test 2-4 weeks out understanding that some insurance companies will need an authorization prior to the service being performed.   For non-scheduling related questions, please contact the cardiac imaging nurse navigator should you have any questions/concerns: Marchia Bond, Cardiac Imaging Nurse Navigator Gordy Clement, Cardiac Imaging Nurse Navigator [facility] Direct Office Dial: [phone]   For scheduling needs, including cancellations and rescheduling, please call [place], [phone].    Follow-Up: At [facility], you and your health needs are our priority.  As part  of our continuing mission to provide you with exceptional heart care, we have created designated Provider Care Teams.  These Care Teams include your primary Cardiologist (physician) and Advanced Practice Providers (APPs -  Physician Assistants and Nurse Practitioners) who all work together to provide you with the care  you need, when you need it.  We recommend signing up for the patient portal called "MyChart".  Sign up information is provided on this After Visit Summary.  MyChart is used to connect with patients for Virtual Visits (Telemedicine).  Patients are able to view lab/test results, encounter notes, upcoming appointments, etc.  Non-urgent messages can be sent to your provider as well.   To learn more about what you can do with MyChart, go to [email or web address].    Your next appointment:   6 month(s)  The format for your next appointment:   In Person  Provider:   Dorris Carnes, MD     Other Instructions   Important Information About Sugar

## 2022-01-19 NOTE — Telephone Encounter (Signed)
Check on abd scan with cardiac ct if the order is correct.

## 2022-01-19 NOTE — Telephone Encounter (Signed)
-----   Message from Lorenza Evangelist, RN sent at 01/19/2022  1:12 PM EST ----- Regarding: RE: cardiac ct We will need an order for abd scan if they want to extend FOV past the bottom of the diaphragm Thank you, Marchia Bond ----- Message ----- From: Stephani Police, RN Sent: 01/19/2022  12:29 PM EST To: Lorenza Evangelist, RN Subject: cardiac ct                                     Hi!! Dr Harrington Challenger ordered a Cardiac CT on this pt and asking if they can expand the field that they are looking at/ scanning due to him also having a history of liver lesions of some sort for the Radiologist to see more. .   I put it in the order but not sure how else to communicate it and if that if it is possible.   Thank you for all that you do to help [place]!!   Lelon Frohlich

## 2022-01-29 NOTE — Addendum Note (Signed)
Addended by: Janan Halter F on: 01/29/2022 03:16 PM   Modules accepted: Orders

## 2022-02-01 ENCOUNTER — Ambulatory Visit (HOSPITAL_COMMUNITY): Payer: Medicare HMO

## 2022-02-01 NOTE — Telephone Encounter (Signed)
Since he would get two boluses, I would have the tests done on separate days perferably 48 hr between contrast doses with plenty PO hydration to allow kidneys to rebound.  CCTA 02/05/22  Thank you,  Clarise Cruz    Pt advised and he will further discuss with his PCP at his next appt on 03/03/2022... re: his Liver "lesions" that he was told her had and has not had any further follow up according to him.

## 2022-02-04 ENCOUNTER — Telehealth (HOSPITAL_COMMUNITY): Payer: Self-pay | Admitting: *Deleted

## 2022-02-04 NOTE — Telephone Encounter (Signed)
Reaching out to patient to offer assistance regarding upcoming cardiac imaging study; pt verbalizes understanding of appt date/time, parking situation and where to check in, pre-test NPO status and medications ordered, and verified current allergies; name and call back number provided for further questions should they arise  Roger Clement RN Navigator Cardiac Imaging [facility] [phone] office [phone] cell   Patient to take '50mg'$  metoprolol tartrate two hours prior to his cardiac CT scan. He is aware to arrive at 10:30am.

## 2022-02-05 ENCOUNTER — Ambulatory Visit (HOSPITAL_BASED_OUTPATIENT_CLINIC_OR_DEPARTMENT_OTHER)
Admission: RE | Admit: 2022-02-05 | Discharge: 2022-02-05 | Disposition: A | Discharge status: Home / Self Care | Payer: Medicare HMO | Source: Ambulatory Visit | Attending: Internal Medicine | Admitting: Internal Medicine

## 2022-02-05 ENCOUNTER — Other Ambulatory Visit: Payer: Self-pay | Admitting: Internal Medicine

## 2022-02-05 ENCOUNTER — Ambulatory Visit (HOSPITAL_COMMUNITY)
Admission: RE | Admit: 2022-02-05 | Discharge: 2022-02-05 | Disposition: A | Discharge status: Home / Self Care | Payer: Medicare HMO | Source: Ambulatory Visit | Attending: Internal Medicine | Admitting: Internal Medicine

## 2022-02-05 DIAGNOSIS — R931 Abnormal findings on diagnostic imaging of heart and coronary circulation: Secondary | ICD-10-CM | POA: Diagnosis not present

## 2022-02-05 DIAGNOSIS — Z79899 Other long term (current) drug therapy: Secondary | ICD-10-CM | POA: Diagnosis present

## 2022-02-05 DIAGNOSIS — I10 Essential (primary) hypertension: Secondary | ICD-10-CM | POA: Insufficient documentation

## 2022-02-05 DIAGNOSIS — R0602 Shortness of breath: Secondary | ICD-10-CM | POA: Insufficient documentation

## 2022-02-05 DIAGNOSIS — E782 Mixed hyperlipidemia: Secondary | ICD-10-CM | POA: Insufficient documentation

## 2022-02-05 DIAGNOSIS — I2510 Atherosclerotic heart disease of native coronary artery without angina pectoris: Secondary | ICD-10-CM | POA: Diagnosis not present

## 2022-02-05 MED ORDER — NITROGLYCERIN 0.4 MG SL SUBL
0.8000 mg | SUBLINGUAL_TABLET | Freq: Once | SUBLINGUAL | Status: AC
  Administered 2022-02-05: 0.8 mg via SUBLINGUAL

## 2022-02-05 MED ORDER — NITROGLYCERIN 0.4 MG SL SUBL
SUBLINGUAL_TABLET | SUBLINGUAL | Status: AC
  Filled 2022-02-05: qty 2

## 2022-02-05 MED ORDER — IOHEXOL 350 MG/ML SOLN
100.0000 mL | Freq: Once | INTRAVENOUS | Status: AC | PRN
  Administered 2022-02-05: 100 mL via INTRAVENOUS

## 2022-02-09 ENCOUNTER — Encounter: Payer: Self-pay | Admitting: Nurse Practitioner

## 2022-02-09 ENCOUNTER — Telehealth: Payer: Self-pay | Admitting: *Deleted

## 2022-02-09 ENCOUNTER — Encounter: Payer: Self-pay | Admitting: *Deleted

## 2022-02-09 NOTE — Telephone Encounter (Signed)
  [place] A DEPT OF [place] A DEPT OF [facility] [place], [place] [id number] [place] [address] Dept: [phone] Loc: [phone]  AUGUSTO DECKMAN  02/09/2022  You are scheduled for a Cardiac Catheterization on Friday, December 29 with Dr. [name].  1. Please arrive at the [facility] (Main Entrance A) at [facility]: [address] [place], [place] [address] at 5:30 AM (This time is two hours before your procedure to ensure your preparation). Free valet parking service is available.   Special note: Every effort is made to have your procedure done on time. Please understand that emergencies sometimes delay scheduled procedures.  2. Diet: Do not eat solid foods after midnight.  The patient may have clear liquids until 5am upon the day of the procedure.  3. Labs: You will need to have blood drawn on HAVE DONE SAME AS CATH   at .   4. Medication instructions in preparation for your procedure:    HOLD HYDROCHLOROTHIAZIDE         On the morning of your procedure, take your Aspirin 81 mg and any morning medicines NOT listed above.  You may use sips of water.  5. Plan for one night stay--bring personal belongings. 6. Bring a current list of your medications and current insurance cards. 7. You MUST have a responsible person to drive you home. 8. Someone MUST be with you the first 24 hours after you arrive home or your discharge will be delayed. 9. Please wear clothes that are easy to get on and off and wear slip-on shoes.  Thank you for allowing [place] to care for you!   -- [facility]

## 2022-02-11 ENCOUNTER — Telehealth: Payer: Self-pay

## 2022-02-11 ENCOUNTER — Telehealth: Payer: Self-pay | Admitting: *Deleted

## 2022-02-11 DIAGNOSIS — I2510 Atherosclerotic heart disease of native coronary artery without angina pectoris: Secondary | ICD-10-CM

## 2022-02-11 DIAGNOSIS — E782 Mixed hyperlipidemia: Secondary | ICD-10-CM

## 2022-02-11 MED ORDER — EZETIMIBE 10 MG PO TABS: 10.0000 mg | ORAL_TABLET | Freq: Every day | ORAL | 3 refills | Status: DC

## 2022-02-11 NOTE — Telephone Encounter (Signed)
-----   Message from Dorris Carnes V, MD sent at 02/09/2022  3:39 PM EST ----- [place] lipid panel LDL 85   With known CAD it should be lower   Goal 70 or below Triglycerides high Recomm:   1.  Add Zetia to regomen   10 mg   Conitnue lipoitor 2  CUt back on carbohydrates, sugars 3  Follow up lipomed in 8 to 12 wks with liver panel

## 2022-02-11 NOTE — Telephone Encounter (Signed)
The patient has been notified of the result and verbalized understanding.  All questions (if any) were answered. [place], RN 02/11/2022 10:03 AM   Pt will have follow up fasting labs in mid march 2024 at [facility] in [place].  Orders placed and released for future draw.

## 2022-02-11 NOTE — Telephone Encounter (Signed)
Cardiac Catheterization scheduled at [facility] for: Friday February 12, 2022 7:30 AM Arrival time and place: [place] Entrance A at: 5:30 AM  Nothing to eat after midnight prior to procedure, clear liquids until 5 AM day of procedure.  Medication instructions: -Hold:  HCTZ-AM of procedure -Except hold medications usual morning medications can be taken with sips of water including aspirin 81 mg.  Confirmed patient has responsible adult to drive home post procedure and be with patient first 24 hours after arriving home.  Patient reports no new symptoms concerning for COVID-19 in the past 10 days.  Reviewed procedure instructions with patient.

## 2022-02-12 ENCOUNTER — Other Ambulatory Visit (HOSPITAL_COMMUNITY): Payer: Self-pay

## 2022-02-12 ENCOUNTER — Ambulatory Visit (HOSPITAL_COMMUNITY)
Admission: RE | Admit: 2022-02-12 | Discharge: 2022-02-12 | Disposition: A | Discharge status: Home / Self Care | Payer: Medicare HMO | Source: Ambulatory Visit | Attending: Interventional Cardiology | Admitting: Interventional Cardiology

## 2022-02-12 ENCOUNTER — Encounter (HOSPITAL_COMMUNITY): Payer: Self-pay | Admitting: Interventional Cardiology

## 2022-02-12 ENCOUNTER — Other Ambulatory Visit: Payer: Self-pay

## 2022-02-12 ENCOUNTER — Ambulatory Visit (HOSPITAL_COMMUNITY)
Admission: RE | Disposition: A | Discharge status: Home / Self Care | Payer: Self-pay | Source: Ambulatory Visit | Attending: Interventional Cardiology

## 2022-02-12 DIAGNOSIS — I77810 Thoracic aortic ectasia: Secondary | ICD-10-CM | POA: Insufficient documentation

## 2022-02-12 DIAGNOSIS — Z8249 Family history of ischemic heart disease and other diseases of the circulatory system: Secondary | ICD-10-CM | POA: Insufficient documentation

## 2022-02-12 DIAGNOSIS — I25118 Atherosclerotic heart disease of native coronary artery with other forms of angina pectoris: Secondary | ICD-10-CM

## 2022-02-12 DIAGNOSIS — Z87891 Personal history of nicotine dependence: Secondary | ICD-10-CM | POA: Insufficient documentation

## 2022-02-12 DIAGNOSIS — I10 Essential (primary) hypertension: Secondary | ICD-10-CM | POA: Insufficient documentation

## 2022-02-12 DIAGNOSIS — Z79899 Other long term (current) drug therapy: Secondary | ICD-10-CM | POA: Diagnosis not present

## 2022-02-12 DIAGNOSIS — E785 Hyperlipidemia, unspecified: Secondary | ICD-10-CM | POA: Insufficient documentation

## 2022-02-12 DIAGNOSIS — I2510 Atherosclerotic heart disease of native coronary artery without angina pectoris: Secondary | ICD-10-CM

## 2022-02-12 DIAGNOSIS — R931 Abnormal findings on diagnostic imaging of heart and coronary circulation: Secondary | ICD-10-CM | POA: Insufficient documentation

## 2022-02-12 DIAGNOSIS — I7789 Other specified disorders of arteries and arterioles: Secondary | ICD-10-CM | POA: Diagnosis present

## 2022-02-12 DIAGNOSIS — Z955 Presence of coronary angioplasty implant and graft: Secondary | ICD-10-CM

## 2022-02-12 HISTORY — PX: LEFT HEART CATH AND CORONARY ANGIOGRAPHY: CATH118249

## 2022-02-12 HISTORY — DX: Atherosclerotic heart disease of native coronary artery without angina pectoris: I25.10

## 2022-02-12 HISTORY — PX: CORONARY STENT INTERVENTION: CATH118234

## 2022-02-12 LAB — POCT ACTIVATED CLOTTING TIME: Activated Clotting Time: 331 seconds

## 2022-02-12 SURGERY — LEFT HEART CATH AND CORONARY ANGIOGRAPHY
Anesthesia: LOCAL

## 2022-02-12 MED ORDER — SODIUM CHLORIDE 0.9% FLUSH: 3.0000 mL | Freq: Two times a day (BID) | INTRAVENOUS | Status: DC

## 2022-02-12 MED ORDER — MIDAZOLAM HCL 2 MG/2ML IJ SOLN
INTRAMUSCULAR | Status: AC
  Filled 2022-02-12: qty 2

## 2022-02-12 MED ORDER — HYDROCHLOROTHIAZIDE 12.5 MG PO CAPS: 12.5000 mg | ORAL_CAPSULE | Freq: Every day | ORAL | Status: DC

## 2022-02-12 MED ORDER — FENTANYL CITRATE (PF) 100 MCG/2ML IJ SOLN
INTRAMUSCULAR | Status: DC | PRN
  Administered 2022-02-12 (×4): 25 ug via INTRAVENOUS

## 2022-02-12 MED ORDER — LIDOCAINE HCL (PF) 1 % IJ SOLN
INTRAMUSCULAR | Status: AC
  Filled 2022-02-12: qty 30

## 2022-02-12 MED ORDER — VERAPAMIL HCL 2.5 MG/ML IV SOLN
INTRAVENOUS | Status: DC | PRN
  Administered 2022-02-12: 10 mL via INTRA_ARTERIAL

## 2022-02-12 MED ORDER — SODIUM CHLORIDE 0.9% FLUSH: 3.0000 mL | INTRAVENOUS | Status: DC | PRN

## 2022-02-12 MED ORDER — CLOPIDOGREL BISULFATE 300 MG PO TABS
ORAL_TABLET | ORAL | Status: DC | PRN
  Administered 2022-02-12: 600 mg via ORAL

## 2022-02-12 MED ORDER — SODIUM CHLORIDE 0.9 % IV SOLN: 250.0000 mL | INTRAVENOUS | Status: DC | PRN

## 2022-02-12 MED ORDER — LISINOPRIL 20 MG PO TABS: 40.0000 mg | ORAL_TABLET | Freq: Every day | ORAL | Status: DC

## 2022-02-12 MED ORDER — IOHEXOL 350 MG/ML SOLN
INTRAVENOUS | Status: DC | PRN
  Administered 2022-02-12: 120 mL

## 2022-02-12 MED ORDER — CLOPIDOGREL BISULFATE 300 MG PO TABS
ORAL_TABLET | ORAL | Status: AC
  Filled 2022-02-12: qty 2

## 2022-02-12 MED ORDER — ASPIRIN 81 MG PO TABS: 81.0000 mg | ORAL_TABLET | Freq: Every day | ORAL | Status: DC

## 2022-02-12 MED ORDER — CLOPIDOGREL BISULFATE 75 MG PO TABS
75.0000 mg | ORAL_TABLET | Freq: Every day | ORAL | 3 refills | Status: DC
  Filled 2022-02-12: qty 90, 90d supply, fill #0

## 2022-02-12 MED ORDER — NITROGLYCERIN 1 MG/10 ML FOR IR/CATH LAB
INTRA_ARTERIAL | Status: DC | PRN
  Administered 2022-02-12: 200 ug via INTRACORONARY
  Administered 2022-02-12: 300 ug via INTRA_ARTERIAL
  Administered 2022-02-12 (×2): 200 ug via INTRACORONARY

## 2022-02-12 MED ORDER — HEPARIN SODIUM (PORCINE) 1000 UNIT/ML IJ SOLN
INTRAMUSCULAR | Status: DC | PRN
  Administered 2022-02-12: 7000 [IU] via INTRAVENOUS
  Administered 2022-02-12: 5000 [IU] via INTRAVENOUS

## 2022-02-12 MED ORDER — FENTANYL CITRATE (PF) 100 MCG/2ML IJ SOLN
INTRAMUSCULAR | Status: AC
  Filled 2022-02-12: qty 2

## 2022-02-12 MED ORDER — ASPIRIN 81 MG PO CHEW: 81.0000 mg | CHEWABLE_TABLET | ORAL | Status: DC

## 2022-02-12 MED ORDER — VERAPAMIL HCL 2.5 MG/ML IV SOLN
INTRAVENOUS | Status: AC
  Filled 2022-02-12: qty 2

## 2022-02-12 MED ORDER — EZETIMIBE 10 MG PO TABS: 10.0000 mg | ORAL_TABLET | Freq: Every day | ORAL | Status: DC

## 2022-02-12 MED ORDER — HEPARIN SODIUM (PORCINE) 1000 UNIT/ML IJ SOLN
INTRAMUSCULAR | Status: AC
  Filled 2022-02-12: qty 10

## 2022-02-12 MED ORDER — DIAZEPAM 5 MG PO TABS: 10.0000 mg | ORAL_TABLET | Freq: Three times a day (TID) | ORAL | Status: DC | PRN

## 2022-02-12 MED ORDER — LABETALOL HCL 5 MG/ML IV SOLN: 10.0000 mg | INTRAVENOUS | Status: AC | PRN

## 2022-02-12 MED ORDER — CLOPIDOGREL BISULFATE 75 MG PO TABS: 75.0000 mg | ORAL_TABLET | Freq: Every day | ORAL | Status: DC

## 2022-02-12 MED ORDER — HYDRALAZINE HCL 20 MG/ML IJ SOLN: 10.0000 mg | INTRAMUSCULAR | Status: AC | PRN

## 2022-02-12 MED ORDER — NITROGLYCERIN 0.4 MG SL SUBL
0.4000 mg | SUBLINGUAL_TABLET | SUBLINGUAL | 3 refills | Status: DC | PRN
  Filled 2022-02-12: qty 25, 7d supply, fill #0

## 2022-02-12 MED ORDER — SODIUM CHLORIDE 0.9 % WEIGHT BASED INFUSION
3.0000 mL/kg/h | INTRAVENOUS | Status: DC
  Administered 2022-02-12: 3 mL/kg/h via INTRAVENOUS

## 2022-02-12 MED ORDER — ASPIRIN 81 MG PO CHEW: 81.0000 mg | CHEWABLE_TABLET | Freq: Every day | ORAL | Status: DC

## 2022-02-12 MED ORDER — ONDANSETRON HCL 4 MG/2ML IJ SOLN: 4.0000 mg | Freq: Four times a day (QID) | INTRAMUSCULAR | Status: DC | PRN

## 2022-02-12 MED ORDER — ATORVASTATIN CALCIUM 80 MG PO TABS
80.0000 mg | ORAL_TABLET | Freq: Every day | ORAL | 3 refills | Status: DC
  Filled 2022-02-12: qty 90, 90d supply, fill #0

## 2022-02-12 MED ORDER — ZOLPIDEM TARTRATE 10 MG PO TABS: 10.0000 mg | ORAL_TABLET | Freq: Every evening | ORAL | Status: DC | PRN

## 2022-02-12 MED ORDER — PANTOPRAZOLE SODIUM 40 MG PO TBEC: 40.0000 mg | DELAYED_RELEASE_TABLET | Freq: Every day | ORAL | Status: DC

## 2022-02-12 MED ORDER — LIDOCAINE HCL (PF) 1 % IJ SOLN
INTRAMUSCULAR | Status: DC | PRN
  Administered 2022-02-12: 2 mL

## 2022-02-12 MED ORDER — NITROGLYCERIN 1 MG/10 ML FOR IR/CATH LAB
INTRA_ARTERIAL | Status: AC
  Filled 2022-02-12: qty 10

## 2022-02-12 MED ORDER — SODIUM CHLORIDE 0.9 % IV SOLN: INTRAVENOUS | Status: AC

## 2022-02-12 MED ORDER — ATORVASTATIN CALCIUM 40 MG PO TABS: 40.0000 mg | ORAL_TABLET | Freq: Every day | ORAL | Status: DC

## 2022-02-12 MED ORDER — CLONAZEPAM 1 MG PO TABS: 1.0000 mg | ORAL_TABLET | Freq: Every evening | ORAL | Status: DC | PRN

## 2022-02-12 MED ORDER — ACETAMINOPHEN 325 MG PO TABS: 650.0000 mg | ORAL_TABLET | ORAL | Status: DC | PRN

## 2022-02-12 MED ORDER — HEPARIN (PORCINE) IN NACL 1000-0.9 UT/500ML-% IV SOLN
INTRAVENOUS | Status: AC
  Filled 2022-02-12: qty 1000

## 2022-02-12 MED ORDER — CLOBETASOL PROPIONATE 0.05 % EX SOLN: 1.0000 | Freq: Every day | CUTANEOUS | Status: DC | PRN

## 2022-02-12 MED ORDER — MIDAZOLAM HCL 2 MG/2ML IJ SOLN
INTRAMUSCULAR | Status: DC | PRN
  Administered 2022-02-12 (×2): 1 mg via INTRAVENOUS
  Administered 2022-02-12: 2 mg via INTRAVENOUS

## 2022-02-12 MED ORDER — SODIUM CHLORIDE 0.9 % WEIGHT BASED INFUSION: 1.0000 mL/kg/h | INTRAVENOUS | Status: DC

## 2022-02-12 SURGICAL SUPPLY — 20 items
BALL SAPPHIRE NC24 2.75X8 (BALLOONS) ×1
BALLN EMERGE MR 2.25X15 (BALLOONS) ×1
BALLOON EMERGE MR 2.25X15 (BALLOONS) IMPLANT
BALLOON SAPPHIRE NC24 2.75X8 (BALLOONS) IMPLANT
CATH 5FR JL3.5 JR4 ANG PIG MP (CATHETERS) IMPLANT
CATH LAUNCHER 6FR EBU 3.5 SH (CATHETERS) IMPLANT
DEVICE RAD COMP TR BAND LRG (VASCULAR PRODUCTS) IMPLANT
ELECT DEFIB PAD ADLT CADENCE (PAD) IMPLANT
GLIDESHEATH SLEND SS 6F .021 (SHEATH) IMPLANT
GUIDEWIRE INQWIRE 1.5J.035X260 (WIRE) IMPLANT
INQWIRE 1.5J .035X260CM (WIRE) ×1
KIT ENCORE 26 ADVANTAGE (KITS) IMPLANT
KIT HEART LEFT (KITS) ×1 IMPLANT
PACK CARDIAC CATHETERIZATION (CUSTOM PROCEDURE TRAY) ×1 IMPLANT
STENT SYNERGY XD 2.50X16 (Permanent Stent) IMPLANT
SYNERGY XD 2.50X16 (Permanent Stent) ×1 IMPLANT
TRANSDUCER W/STOPCOCK (MISCELLANEOUS) ×1 IMPLANT
TUBING CIL FLEX 10 FLL-RA (TUBING) ×1 IMPLANT
VALVE GUARDIAN II ~~LOC~~ HEMO (MISCELLANEOUS) IMPLANT
WIRE RUNTHROUGH .014X180CM (WIRE) IMPLANT

## 2022-02-12 NOTE — Discharge Summary (Addendum)
Discharge Summary for Same Day PCI   Patient ID: Roger Powers MRN: [id number]; DOB: Dec 23, 1955  Admit date: 02/12/2022 Discharge date: 02/12/2022  Primary Care Provider: Lemmie Evens, MD  Primary Cardiologist: Dorris Carnes, MD  Primary Electrophysiologist:  None   Discharge Diagnoses    Principal Problem:   CAD (coronary artery disease) Active Problems:   Aortic root enlargement (HCC)   Abnormal echocardiogram   Hypertension   Hyperlipidemia with target LDL less than 70   Diagnostic Studies/Procedures    Cardiac Catheterization 02/12/2022:    Prox RCA lesion is 25% stenosed.   RPAV lesion is 50% stenosed.   Mid LAD-1 lesion is 25% stenosed.   Mid LAD-2 lesion is 40% stenosed.   Mid LAD-3 lesion is 75% stenosed.  A drug-eluting stent was successfully placed using a SYNERGY XD 2.50X16, postdilated to greater than 2.75 mm.   Post intervention, there is a 0% residual stenosis.   The left ventricular systolic function is normal.   LV end diastolic pressure is normal.  LVEDP 8 mmHg.   The left ventricular ejection fraction is 55-65% by visual estimate.   There is no aortic valve stenosis.   Successful PCI of the mid LAD.  Mild to moderate disease elsewhere in the coronary tree.  Continue aggressive preventive therapy along with dual antiplatelet therapy for at least 6 months.  After 6 months, would consider clopidogrel monotherapy going forward given his diffuse atherosclerosis and calcification.  _____________   History of Present Illness     Roger Powers is a 66 y.o. male with a history of atypical chest pain, CAD by CT, GERD, hypertension, and hyperlipidemia.  He was seen in the office 01/19/2022 with Dr. Harrington Challenger and he reported worsening shortness of breath.  In addition to coronary calcification seen on CTA, patient also has a mildly dilated aorta at 43 mm.  Given symptoms, CT coronary was obtained on 02/05/2022 that showed CAD with a positive FFR in the mid LAD.  Decision  was made to proceed with scheduled outpatient definitive angiography.  Cardiac catheterization was arranged for further evaluation.  Hospital Course     The patient underwent cardiac cath as noted above with 75% mid LAD lesion successfully treated with PCI/DES 2.5 x 16 mm. Plan for DAPT with ASA/Plavix for at least 6 months. The patient was seen by cardiac rehab while in short stay. There were no observed complications post cath. Radial cath site was re-evaluated prior to discharge and found to be stable without any complications. Instructions/precautions regarding cath site care were given prior to discharge.  Roger Powers was seen by [name] and determined stable for discharge home. Follow up with our office has been arranged. Medications are listed below. Pertinent changes include addition of plavix.  LDL was 85 with hypertriglyceridemia. Zetia 10 mg added. Given disease, will increase to 80 mg lipitor.      _____________  Cath/PCI Registry Performance & Quality Measures: Aspirin prescribed? - Yes ADP Receptor Inhibitor (Plavix/Clopidogrel, Brilinta/Ticagrelor or Effient/Prasugrel) prescribed (includes medically managed patients)? - Yes High Intensity Statin (Lipitor 40-79m or Crestor 20-475m prescribed? - Yes For EF <40%, was ACEI/ARB prescribed? - Yes For EF <40%, Aldosterone Antagonist (Spironolactone or Eplerenone) prescribed? - Not Applicable (EF >/= 4030%Cardiac Rehab Phase II ordered (Included Medically managed Patients)? - Yes  _____________   Discharge Vitals Blood pressure 131/87, pulse 67, temperature 97.6 F (36.4 C), temperature source Temporal, resp. rate 14, height _0  (1.803 m), weight 99.8 kg,  SpO2 94 %.  Filed Weights   02/12/22 0540  Weight: 99.8 kg    Last Labs & Radiologic Studies    CBC No results for input(s): "WBC", "NEUTROABS", "HGB", "HCT", "MCV", "PLT" in the last 72 hours. Basic Metabolic Panel No results for input(s): "NA", "K", "CL",  "CO2", "GLUCOSE", "BUN", "CREATININE", "CALCIUM", "MG", "PHOS" in the last 72 hours. Liver Function Tests No results for input(s): "AST", "ALT", "ALKPHOS", "BILITOT", "PROT", "ALBUMIN" in the last 72 hours. No results for input(s): "LIPASE", "AMYLASE" in the last 72 hours. High Sensitivity Troponin:   No results for input(s): "TROPONINIHS" in the last 720 hours.  BNP Invalid input(s): "POCBNP" D-Dimer No results for input(s): "DDIMER" in the last 72 hours. Hemoglobin A1C No results for input(s): "HGBA1C" in the last 72 hours. Fasting Lipid Panel No results for input(s): "CHOL", "HDL", "LDLCALC", "TRIG", "CHOLHDL", "LDLDIRECT" in the last 72 hours. Thyroid Function Tests No results for input(s): "TSH", "T4TOTAL", "T3FREE", "THYROIDAB" in the last 72 hours.  Invalid input(s): "FREET3" _____________  CARDIAC CATHETERIZATION  Addendum Date: 02/12/2022     Prox RCA lesion is 25% stenosed.   RPAV lesion is 50% stenosed.   Mid LAD-1 lesion is 25% stenosed.   Mid LAD-2 lesion is 40% stenosed.   Mid LAD-3 lesion is 75% stenosed.  A drug-eluting stent was successfully placed using a SYNERGY XD 2.50X16, postdilated to greater than 2.75 mm.   Post intervention, there is a 0% residual stenosis.   The left ventricular systolic function is normal.   LV end diastolic pressure is normal.  LVEDP 8 mmHg.   The left ventricular ejection fraction is 55-65% by visual estimate.   There is no aortic valve stenosis.   There was tortuosity in the right subclavian making catheter manipulation somewhat difficult.  There was right radial spasm. Successful PCI of the mid LAD.  Mild to moderate disease elsewhere in the coronary tree.  Continue aggressive preventive therapy along with dual antiplatelet therapy for at least 6 months.  After 6 months, would consider clopidogrel monotherapy going forward given his diffuse atherosclerosis and calcification.  Result Date: 02/12/2022   Prox RCA lesion is 25% stenosed.   RPAV  lesion is 50% stenosed.   Mid LAD-1 lesion is 25% stenosed.   Mid LAD-2 lesion is 40% stenosed.   Mid LAD-3 lesion is 75% stenosed.  A drug-eluting stent was successfully placed using a SYNERGY XD 2.50X16, postdilated to greater than 2.75 mm.   Post intervention, there is a 0% residual stenosis.   The left ventricular systolic function is normal.   LV end diastolic pressure is normal.  LVEDP 8 mmHg.   The left ventricular ejection fraction is 55-65% by visual estimate.   There is no aortic valve stenosis. Successful PCI of the mid LAD.  Mild to moderate disease elsewhere in the coronary tree.  Continue aggressive preventive therapy along with dual antiplatelet therapy for at least 6 months.  After 6 months, would consider clopidogrel monotherapy going forward given his diffuse atherosclerosis and calcification.   CT CORONARY FFR DATA PREP & FLUID ANALYSIS  Result Date: 02/05/2022 EXAM: CT FFR analysis was performed on the original cardiac CTA dataset. Diagrammatic representation of the CT FFR analysis is provided in a separate PDF document in PACS. This dictation was created using the PDF document and an interactive 3D model of the results. The 3D model is not available in the EMR/PACS. INTERPRETATION: CT FFR provides simultaneous calculation of pressure and flow across the entire coronary  tree. For clinical decision making, CT FFR values should be obtained 1-2 cm distal to the lower border of each stenosis measured. Coronary CTA-related artifacts may impair the diagnostic accuracy of the original cardiac CTA and FFR CT results. *Due to the fact that CT FFR represents a mathematically-derived analysis, it is recommended that the results be interpreted as follows: 1. CT FFR >0.80: Low likelihood of hemodynamic significance. 2. CT FFR 0.76-0.80: Borderline likelihood of hemodynamic significance. 3. CT FFR =< 0.75: High likelihood of hemodynamic significance. *Coronary CT Angiography-derived Fractional Flow  Reserve Testing in Patients with Stable Coronary Artery Disease: Recommendations on Interpretation and Reporting. Radiology: Cardiothoracic Imaging. 2019;1(5):e190050 FINDINGS: 1. Left Main: Low likelihood of hemodynamic significance. 2. LAD: High likelihood of hemodynamic significance, FFR at mid LAD lesion 0.65 with delta FFR 0.22. 3. Ramus intermedius:  Low likelihood of hemodynamic significance. 4. LCX: Low likelihood of hemodynamic significance. 5. RCA: Low likelihood of hemodynamic significance. IMPRESSION: 1. CT FFR analysis showed high likelihood of hemodynamic significant stenosis at mid LAD lesion FFR 0.65. Consider cardiac catheterization if clinically indicated. Cherlynn Kaiser, MD Electronically Signed   By: Cherlynn Kaiser M.D.   On: 02/05/2022 13:39   CT CORONARY MORPH W/CTA COR W/SCORE W/CA W/CM &/OR WO/CM  Addendum Date: 02/05/2022   ADDENDUM REPORT: 02/05/2022 13:22 EXAM: OVER-READ INTERPRETATION  CT CHEST The following report is an over-read performed by radiologist Dr. Yvonne Kendall of [facility], [place] on 02/05/2022. This over-read does not include interpretation of cardiac or coronary anatomy or pathology. The coronary calcium score/coronary CTA interpretation by the cardiologist is attached. COMPARISON:  [facility] chest 01/28/2020, CT chest without contrast 11/19/2020 FINDINGS: Cardiovascular: Visualized proximal ascending aorta measures up to approximately 4.0 x 4.0 cm in caliber, previously measured at 4.1 x 1.2 cm on 11/19/2020 CT and not significant changed CT Mediastinum/Nodes: There are no enlarged lymph nodes within the visualized mediastinum. Lungs/Pleura: There is no pleural effusion. There is a 3 mm posterolateral left lower lobe nodule (axial series 11, image 36) that is unchanged from 11/19/2020 CT and likely benign. No follow-up imaging recommended. Upper abdomen: No significant findings in the visualized upper abdomen. Musculoskeletal/Chest wall: No chest wall mass or  suspicious osseous findings within the visualized chest. 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 This is not significantly changed from 11/19/2020 CT. Recommend continued annual imaging followup by CTA or MRA. This recommendation follows 2010 ACCF/AHA/AATS/ACR/ASA/SCA/SCAI/SIR/STS/SVM Guidelines for the Diagnosis and Management of Patients with Thoracic Aortic Disease. Circulation. 2010; 121: X735-H299. Aortic aneurysm NOS (ICD10-I71.9) Electronically Signed   By: Yvonne Kendall M.D.   On: 02/05/2022 13:22   Result Date: 02/05/2022 HISTORY: CAD screening, intermediate CAD risk, not treadmill candidate Dyspnea on exertion (DOE) EXAM: Cardiac/Coronary  CT TECHNIQUE: The patient was scanned on a [employer]. PROTOCOL: A 120 kV prospective scan was triggered in the descending thoracic aorta at 111 HU's. Axial non-contrast 3 mm slices were carried out through the heart. The data set was analyzed on a dedicated work station and scored using the Agatston method. Gantry rotation speed was 250 msecs and collimation was .6 mm. Beta blockade and 0.8 mg of sl NTG was given. The 3D data set was reconstructed in 5% intervals of the 35-75 % of the R-R cycle. Systolic and diastolic phases were analyzed on a dedicated work station using MPR, MIP and VRT modes. The patient received contrast: 128m OMNIPAQUE IOHEXOL 350 MG/ML SOLN. FINDINGS: Image quality: Good Noise  artifact is: Limited Coronary calcium score is 847, which places the patient in the 85th percentile for age and sex matched control. Coronary arteries: Normal coronary origins.  Right dominance. Right Coronary Artery: Severe mixed atherosclerotic plaque in the proximal RCA, 70-99% stenosis. Mild mixed atherosclerotic plaque in the mid RCA with low attenuation plaque, 25-49% stenosis. Mild scattered plaque in the distal RCA, PDA and PL, 25-49% stenosis. Left Main Coronary Artery: Minimal  distal LM mixed atherosclerotic plaque, <25% stenosis. Left Anterior Descending Coronary Artery: Mild mixed atherosclerotic plaque in the proximal LAD, 25-49% stenosis. Moderate mixed atherosclerotic plaque in the mid LAD at the level of the D1 bifurcation, 50-69% stenosis. Minimal ostial plaque in D1, <25% stenosis. Severe mixed atherosclerotic plaque in the mid LAD just distal to the D1 takeoff, 70-99% stenosis, and a serial severe stenosis 70-99% stenosis in the mid LAD. Distal vessel has mild scattered disease. Ramus intermedius: Mild mixed atherosclerotic plaque in the mid RI, 25-49%. Left Circumflex Artery: Mild mixed atherosclerotic plaque in the proximal LCx, 25-49%. Aorta: Mild dilation, 42 x 40 x 39 mm at sinus of Valsalva, 40 mm at the mid ascending aorta (level of the PA bifurcation) measured double oblique. Aortic Valve: No calcifications. Other findings: Normal pulmonary vein drainage into the left atrium. Normal left atrial appendage without thrombus. Mild dilation of main and branch pulmonary arteries Severe basal septal hypertrophy, 16-17 mm. Please see separate report from [facility] for non-cardiac findings. IMPRESSION: 1. Severe CAD in the proximal RCA and mid LAD, 70-99% stenosis, CADRADS 4. CT FFR will be performed and reported separately. 2. Coronary calcium score is 847, which places the patient in the 85th percentile for age and sex matched control. 3. Normal coronary origins with right dominance. 4. Severe basal septal hypertrophy, 16-17 mm. 5. Mild dilation of main and branch pulmonary arteries, which may indicate increased pulmonary pressures. 6. Mild dilation of ascending aorta, 42 mm at sinus of Valsalva, 40 mm at mid ascending aorta. RECOMMENDATIONS: CAD-RADS 4 Severe stenosis. (70-99% or > 50% left main). Cardiac catheterization or CT FFR is recommended. Consider symptom-guided anti-ischemic pharmacotherapy as well as risk factor modification per guideline directed care.  Electronically Signed: By: Cherlynn Kaiser M.D. On: 02/05/2022 12:26    Disposition   Pt is being discharged home today in good condition.  Follow-up Plans & Appointments     Follow-up Information     Elgie Collard, PA-C Follow up on 02/19/2022.   Specialty: Cardiology Why: 2:20 pm for post cath follow up Contact information: [place] [address] [phone]                Discharge Instructions     Amb Referral to Cardiac Rehabilitation   Complete by: As directed    Diagnosis: Coronary Stents   After initial evaluation and assessments completed: Virtual Based Care may be provided alone or in conjunction with Phase 2 Cardiac Rehab based on patient barriers.: Yes   Intensive Cardiac Rehabilitation (ICR) [place] location only OR Traditional Cardiac Rehabilitation (TCR) *If criteria for ICR are not met will enroll in TCR [facility] only): Yes        Discharge Medications   Allergies as of 02/12/2022   No Known Allergies      Medication List     STOP taking these medications    metoprolol tartrate 50 MG tablet Commonly known as: LOPRESSOR       TAKE these medications    aspirin 81 MG  tablet Take 81 mg by mouth daily.   atorvastatin 80 MG tablet Commonly known as: LIPITOR Take 1 tablet (80 mg total) by mouth daily. What changed:  medication strength how much to take   clobetasol 0.05 % external solution Commonly known as: TEMOVATE Apply 1 application  topically daily as needed (psoriasis).   clonazePAM 1 MG tablet Commonly known as: KLONOPIN Take 1 mg by mouth at bedtime as needed for anxiety.   clopidogrel 75 MG tablet Commonly known as: PLAVIX Take 1 tablet (75 mg total) by mouth daily with breakfast. Start taking on: February 13, 2022   diazepam 10 MG tablet Commonly known as: VALIUM Take 10 mg by mouth every 8 (eight) hours as needed for anxiety.   ezetimibe 10 MG tablet Commonly known as: ZETIA Take 1 tablet (10  mg total) by mouth daily.   hydrochlorothiazide 12.5 MG capsule Commonly known as: MICROZIDE Take 1 capsule (12.5 mg total) by mouth daily.   lisinopril 40 MG tablet Commonly known as: ZESTRIL Take 1 tablet (40 mg total) by mouth daily.   MULTIVITAMIN MEN 50+ PO Take 1 tablet by mouth daily.   nitroGLYCERIN 0.4 MG SL tablet Commonly known as: Nitrostat Place 1 tablet (0.4 mg total) under the tongue every 5 (five) minutes as needed for chest pain.   OVER THE COUNTER MEDICATION Take 1 tablet by mouth daily. Dynamic Brain Support   OVER THE COUNTER MEDICATION Take 1 tablet by mouth daily. Immune Support Multivitamin   pantoprazole 40 MG tablet Commonly known as: PROTONIX Take 1 tablet (40 mg total) by mouth daily.   zolpidem 10 MG tablet Commonly known as: AMBIEN Take 10 mg by mouth at bedtime as needed for sleep.           Allergies No Known Allergies  Outstanding Labs/Studies   Lipid panel 6 weeks  Duration of Discharge Encounter   Greater than 30 minutes including physician time.  Signed, Tami Lin Duke, PA 02/12/2022, 1:14 PM  I have examined the patient and reviewed assessment and plan and discussed with patient.  Agree with above as stated.    I stressed the importance of dual antiplatelet therapy.  Right radial site stable.  Consider clopidogrel monotherapy after 6 months of dual antiplatelet therapy.  Healthy lifestyle encouraged.  [name]

## 2022-02-12 NOTE — Discharge Instructions (Signed)

## 2022-02-12 NOTE — Progress Notes (Signed)
CARDIAC REHAB PHASE I   Pt received in recliner with wife present in room. Pt and wife educated on risk factors, site care, exercise guidelines, angina, stent, antiplatelet therapy, restrictions, nutrition, and CRP2 referral to be placed at [facility]. Pt and wife verbalized understanding, left in recliner with all needs met and call bell in reach.  [phone]  Roger Ewing, MS 02/12/2022 12:01 PM

## 2022-02-12 NOTE — Progress Notes (Signed)
TR BAND REMOVAL  LOCATION:    right radial  DEFLATED PER PROTOCOL:    Yes.    TIME BAND OFF / DRESSING APPLIED:    1123 gauze dressing applied   SITE UPON ARRIVAL:    Level 0  SITE AFTER BAND REMOVAL:    Level 0  CIRCULATION SENSATION AND MOVEMENT:    Within Normal Limits   Yes.    COMMENTS:   no new issues noted

## 2022-02-12 NOTE — Interval H&P Note (Signed)
Cath Lab Visit (complete for each Cath Lab visit)  Clinical Evaluation Leading to the Procedure:   ACS: No.  Non-ACS:    Anginal Classification: CCS III  Anti-ischemic medical therapy: Minimal Therapy (1 class of medications)  Non-Invasive Test Results: Intermediate-risk stress test findings: cardiac mortality 1-3%/year  Prior CABG: No previous CABG    Abnormal CTA  History and Physical Interval Note:  02/12/2022 7:42 AM  Roger Powers  has presented today for surgery, with the diagnosis of Abnormal CT.  The various methods of treatment have been discussed with the patient and family. After consideration of risks, benefits and other options for treatment, the patient has consented to  Procedure(s): LEFT HEART CATH AND CORONARY ANGIOGRAPHY (N/A) as a surgical intervention.  The patient's history has been reviewed, patient examined, no change in status, stable for surgery.  I have reviewed the patient's chart and labs.  Questions were answered to the patient's satisfaction.     Roger Powers

## 2022-02-17 MED FILL — Heparin Sod (Porcine)-NaCl IV Soln 1000 Unit/500ML-0.9%: INTRAVENOUS | Qty: 1000 | Status: AC

## 2022-02-18 NOTE — Progress Notes (Signed)
Office Visit    Patient Name: Roger Powers Date of Encounter: 02/19/2022  PCP:  Lemmie Evens, [facility]  Cardiologist:  Dorris Carnes, MD  Advanced Practice Provider:  No care team member to display Electrophysiologist:  None   HPI    Roger Powers is a 67 y.o. male with past medical history of atypical chest pain, CAD by CT, GERD, hypertension, hyperlipidemia presents today for follow-up postcardiac catheterization and stent placement.  He was seen in the office 01/19/2022 by Dr. Harrington Challenger and reported worsening shortness of breath.  In addition to coronary calcification seen on CTA, patient had mildly dilated aorta at 43 mm.  Given symptoms, CT coronary was obtained on 02/05/2022 which showed CAD with positive FFR in the mid LAD.  Decision was made to proceed with scheduled outpatient definitive angiography.  Cardiac catheterization was arranged for further evaluation.  Cardiac catheterization showed 75% mid LAD lesion successfully treated with PCI/DES 2.5 x 16 mm.  Plan for DAPT with ASA/Plavix for 6 months.  Patient seen by cardiac rehab on short stay.  No complications post cath.  Radial cath site was stable and without complications.  Stable for discharge.  LDL was 85 with hypertriglyceridemia.  Zetia 10 mg was added.  Given disease, Lipitor was increased to 80 mg.  Today, he tell me he is still having some lightheadedness. No chest pain. He is asking for a referral to rehab. He will need some follow-up labs at his next office visit. Since he is not feeling much better we discussed getting an echo for further evaluation as well as a carotid US.   Reports no shortness of breath nor dyspnea on exertion. Reports no chest pain, pressure, or tightness. No edema, orthopnea, PND. Reports no palpitations.   Past Medical History    Past Medical History:  Diagnosis Date   CAD (coronary artery disease)    02/12/22: LHC DES 2.50x16 to midLAD, residual dz tx medically    Cataract    RIGHT eye only   GERD (gastroesophageal reflux disease)    hx of- with certain foods- on meds   Hypercholesteremia    on meds   Hypertension    on meds   Psoriasis    Past Surgical History:  Procedure Laterality Date   CATARACT EXTRACTION W/ INTRAOCULAR LENS IMPLANT Right 2013   COLONOSCOPY  2004   patient cannot remember where/MD   CORONARY STENT INTERVENTION N/A 02/12/2022   Procedure: CORONARY STENT INTERVENTION;  Surgeon: Jettie Booze, MD;  Location: [facility];  Service: Cardiovascular;  Laterality: N/A;   LEFT HEART CATH AND CORONARY ANGIOGRAPHY N/A 02/12/2022   Procedure: LEFT HEART CATH AND CORONARY ANGIOGRAPHY;  Surgeon: Jettie Booze, MD;  Location: [facility];  Service: Cardiovascular;  Laterality: N/A;   RETINAL DETACHMENT SURGERY Right 2012   WISDOM TOOTH EXTRACTION      Allergies  No Known Allergies   EKGs/Labs/Other Studies Reviewed:   The following studies were reviewed today:  Cardiac Catheterization 02/12/2022:     Prox RCA lesion is 25% stenosed.   RPAV lesion is 50% stenosed.   Mid LAD-1 lesion is 25% stenosed.   Mid LAD-2 lesion is 40% stenosed.   Mid LAD-3 lesion is 75% stenosed.  A drug-eluting stent was successfully placed using a SYNERGY XD 2.50X16, postdilated to greater than 2.75 mm.   Post intervention, there is a 0% residual stenosis.   The left ventricular systolic  function is normal.   LV end diastolic pressure is normal.  LVEDP 8 mmHg.   The left ventricular ejection fraction is 55-65% by visual estimate.   There is no aortic valve stenosis.   Successful PCI of the mid LAD.  Mild to moderate disease elsewhere in the coronary tree.  Continue aggressive preventive therapy along with dual antiplatelet therapy for at least 6 months.  After 6 months, would consider clopidogrel monotherapy going forward given his diffuse atherosclerosis and calcification.   EKG:  EKG is not ordered today.   Recent  Labs: No results found for requested labs within last 365 days.  Recent Lipid Panel    Component Value Date/Time   CHOL 134 09/29/2016 0737   TRIG 185 (H) 09/29/2016 0737   HDL 26 (L) 09/29/2016 0737   CHOLHDL 5.2 (H) 09/29/2016 0737   VLDL 37 (H) 09/29/2016 0737   LDLCALC 71 09/29/2016 0737    Home Medications   Current Meds  Medication Sig   aspirin 81 MG tablet Take 81 mg by mouth daily.   atorvastatin (LIPITOR) 80 MG tablet Take 1 tablet (80 mg total) by mouth daily.   clobetasol (TEMOVATE) 0.05 % external solution Apply 1 application  topically daily as needed (psoriasis).   clonazePAM (KLONOPIN) 1 MG tablet Take 1 mg by mouth at bedtime as needed for anxiety.   clopidogrel (PLAVIX) 75 MG tablet Take 1 tablet (75 mg total) by mouth daily with breakfast.   diazepam (VALIUM) 10 MG tablet Take 10 mg by mouth every 8 (eight) hours as needed for anxiety.   ezetimibe (ZETIA) 10 MG tablet Take 1 tablet (10 mg total) by mouth daily.   hydrochlorothiazide (MICROZIDE) 12.5 MG capsule Take 1 capsule (12.5 mg total) by mouth daily.   lisinopril (ZESTRIL) 40 MG tablet Take 1 tablet (40 mg total) by mouth daily.   Multiple Vitamins-Minerals (MULTIVITAMIN MEN 50+ PO) Take 1 tablet by mouth daily.   nitroGLYCERIN (NITROSTAT) 0.4 MG SL tablet Place 1 tablet (0.4 mg total) under the tongue every 5 (five) minutes as needed for chest pain.   OVER THE COUNTER MEDICATION Take 1 tablet by mouth daily. Dynamic Brain Support   OVER THE COUNTER MEDICATION Take 1 tablet by mouth daily. Immune Support Multivitamin   pantoprazole (PROTONIX) 40 MG tablet Take 1 tablet (40 mg total) by mouth daily.   zolpidem (AMBIEN) 10 MG tablet Take 10 mg by mouth at bedtime as needed for sleep.      Review of Systems      All other systems reviewed and are otherwise negative except as noted above.  Physical Exam    VS:  BP 130/82   Pulse 80   Ht '5\' 11"'$  (1.803 m)   Wt 220 lb 3.2 oz (99.9 kg)   SpO2 96%   BMI  30.71 kg/m  , BMI Body mass index is 30.71 kg/m.  Wt Readings from Last 3 Encounters:  02/19/22 220 lb 3.2 oz (99.9 kg)  02/12/22 220 lb (99.8 kg)  01/19/22 229 lb 6.4 oz (104.1 kg)     GEN: Well nourished, well developed, in no acute distress. HEENT: normal. Neck: Supple, no JVD, carotid bruits, or masses. Cardiac: RRR, no murmurs, rubs, or gallops. No clubbing, cyanosis, edema.  Radials/PT 2+ and equal bilaterally.  Respiratory:  Respirations regular and unlabored, clear to auscultation bilaterally. GI: Soft, nontender, nondistended. MS: No deformity or atrophy. Skin: Warm and dry, no rash. Neuro:  Strength and sensation are intact. Psych:  Normal affect.  Assessment & Plan    Orthostatic hypotension? -sometimes dizziness and lightheaded when he stands up -compression hose -hydration  -will get an echo since its been a while to check his heart valves  -carotid US  CAD status post PCI -recovering well -continue GDMT: asa 81 mg, lipitor 80 mg daily, plavix 75 mg daily, zetia 10 mg daily, lisinopril 40 mg daily, nitro as needed  Hypertension -well controlled on current regimen -continue to monitor at home  Hyperlipidemia -he will need LDL next time he is in the office -for now continue lipitor  Aortic root enlargement -continue to monitor BP at home        Cardiac Rehabilitation Eligibility Assessment  The patient is ready to start cardiac rehabilitation from a cardiac standpoint.   Disposition: Follow up 3 months with Dorris Carnes, MD or APP.  Signed, Elgie Collard, PA-C 02/19/2022, 3:21 PM [facility]

## 2022-02-19 ENCOUNTER — Encounter: Payer: Self-pay | Admitting: Physician Assistant

## 2022-02-19 ENCOUNTER — Ambulatory Visit: Payer: Medicare HMO | Attending: Physician Assistant | Admitting: Physician Assistant

## 2022-02-19 VITALS — BP 130/82 | HR 80 | Ht 71.0 in | Wt 220.2 lb

## 2022-02-19 DIAGNOSIS — I7789 Other specified disorders of arteries and arterioles: Secondary | ICD-10-CM | POA: Diagnosis not present

## 2022-02-19 DIAGNOSIS — E785 Hyperlipidemia, unspecified: Secondary | ICD-10-CM | POA: Diagnosis not present

## 2022-02-19 DIAGNOSIS — I10 Essential (primary) hypertension: Secondary | ICD-10-CM

## 2022-02-19 DIAGNOSIS — R42 Dizziness and giddiness: Secondary | ICD-10-CM | POA: Diagnosis not present

## 2022-02-19 DIAGNOSIS — I2510 Atherosclerotic heart disease of native coronary artery without angina pectoris: Secondary | ICD-10-CM | POA: Diagnosis not present

## 2022-02-19 NOTE — Patient Instructions (Addendum)
Medication Instructions:  Your physician recommends that you continue on your current medications as directed. Please refer to the Current Medication list given to you today.  *If you need a refill on your cardiac medications before your next appointment, please call your pharmacy*   Lab Work: Fasting lipid panel and lft's in 3 months If you have labs (blood work) drawn today and your tests are completely normal, you will receive your results only by: [place] (if you have MyChart) OR A paper copy in the mail If you have any lab test that is abnormal or we need to change your treatment, we will call you to review the results.   Testing/Procedures: Your physician has requested that you have a carotid duplex. This test is an ultrasound of the carotid arteries in your neck. It looks at blood flow through these arteries that supply the brain with blood. Allow one hour for this exam. There are no restrictions or special instructions.  Your physician has requested that you have an echocardiogram. Echocardiography is a painless test that uses sound waves to create images of your heart. It provides your doctor with information about the size and shape of your heart and how well your heart's chambers and valves are working. This procedure takes approximately one hour. There are no restrictions for this procedure. Please do NOT wear cologne, perfume, aftershave, or lotions (deodorant is allowed). Please arrive 15 minutes prior to your appointment time.    Follow-Up: At [facility], you and your health needs are our priority.  As part of our continuing mission to provide you with exceptional heart care, we have created designated Provider Care Teams.  These Care Teams include your primary Cardiologist (physician) and Advanced Practice Providers (APPs -  Physician Assistants and Nurse Practitioners) who all work together to provide you with the care you need, when you need it.   Your  next appointment:   3 month(s)  The format for your next appointment:   In Person  Provider:   Dorris Carnes, MD    Other Instructions 1.Check your blood pressure daily, one hour after taking your morning medications for the next 2 weeks, keep a log and send [place] the readings through mychart 2.Aim for 64 oz of water daily 3.Wear compression hose as discussed   Important Information About Sugar

## 2022-02-24 ENCOUNTER — Other Ambulatory Visit: Payer: Self-pay | Admitting: Physician Assistant

## 2022-02-24 DIAGNOSIS — R42 Dizziness and giddiness: Secondary | ICD-10-CM

## 2022-02-24 DIAGNOSIS — E785 Hyperlipidemia, unspecified: Secondary | ICD-10-CM

## 2022-02-24 DIAGNOSIS — I10 Essential (primary) hypertension: Secondary | ICD-10-CM

## 2022-02-24 DIAGNOSIS — I7789 Other specified disorders of arteries and arterioles: Secondary | ICD-10-CM

## 2022-02-24 DIAGNOSIS — I2510 Atherosclerotic heart disease of native coronary artery without angina pectoris: Secondary | ICD-10-CM

## 2022-03-03 DIAGNOSIS — I10 Essential (primary) hypertension: Secondary | ICD-10-CM | POA: Diagnosis not present

## 2022-03-03 DIAGNOSIS — I2510 Atherosclerotic heart disease of native coronary artery without angina pectoris: Secondary | ICD-10-CM | POA: Diagnosis not present

## 2022-03-03 DIAGNOSIS — E782 Mixed hyperlipidemia: Secondary | ICD-10-CM | POA: Diagnosis not present

## 2022-03-03 DIAGNOSIS — F419 Anxiety disorder, unspecified: Secondary | ICD-10-CM | POA: Diagnosis not present

## 2022-03-15 ENCOUNTER — Telehealth: Payer: Self-pay | Admitting: Internal Medicine

## 2022-03-15 DIAGNOSIS — I2510 Atherosclerotic heart disease of native coronary artery without angina pectoris: Secondary | ICD-10-CM

## 2022-03-15 NOTE — Telephone Encounter (Signed)
Stop lisinopril and HCTZ   Keep following BP CHeck BMET and CBC this week

## 2022-03-15 NOTE — Telephone Encounter (Signed)
Pt returning nurse's call. Please advise

## 2022-03-15 NOTE — Telephone Encounter (Signed)
Pt called to report that he has been having some dizziness and weakness and has noticed his BP has been dropping... he has a list but did not have in front of him.. he will try to My Chart to Dr Harrington Challenger for her review.   He was put on HCTZ when he had his stent placed 01/2022 and has since stared staggering  his meds and has had some improvement... he says he has been eating and drinking well.   His BP runs 90/53, 88/51 when it is low and does not matter the time of day.   Normally since his stent 107-114/60's.   He denies chest pain and no palpitations... hr staying in the 70's.   I offered him appts this week but he says he has other appts and cannot come in.   He is not seeing Dr Harrington Challenger until 05/2022... he has echo planned for 03/17/22.   Will forward to Dr Harrington Challenger while waiting to see if he is able to send his BP readings to see if we need to alter any meds.

## 2022-03-15 NOTE — Telephone Encounter (Signed)
Left voicemail to call the clinic on 1/39/24 @ 1538

## 2022-03-15 NOTE — Telephone Encounter (Signed)
Pt c/o BP issue: STAT if pt c/o blurred vision, one-sided weakness or slurred speech  1. What are your last 5 BP readings? 87/51; 93/53;   2. Are you having any other symptoms (ex. Dizziness, headache, blurred vision, passed out)? Lightheadedness, dizziness, weak  3. What is your BP issue? Patient is experiencing hypotension on medication. Had a stint put in 12/29

## 2022-03-16 DIAGNOSIS — I2510 Atherosclerotic heart disease of native coronary artery without angina pectoris: Secondary | ICD-10-CM | POA: Diagnosis not present

## 2022-03-16 NOTE — Telephone Encounter (Signed)
Patient requested to have his labs drawn at [facility], the ordered are placed. The patient has been notified of the result and verbalized understanding.  All questions (if any) were answered. Gershon Crane, LPN 08/18/5007 3:81 AM

## 2022-03-17 ENCOUNTER — Ambulatory Visit: Payer: Medicare HMO | Attending: Physician Assistant

## 2022-03-17 ENCOUNTER — Ambulatory Visit (INDEPENDENT_AMBULATORY_CARE_PROVIDER_SITE_OTHER): Payer: Medicare HMO

## 2022-03-17 DIAGNOSIS — I10 Essential (primary) hypertension: Secondary | ICD-10-CM | POA: Diagnosis not present

## 2022-03-17 DIAGNOSIS — I7789 Other specified disorders of arteries and arterioles: Secondary | ICD-10-CM

## 2022-03-17 DIAGNOSIS — I119 Hypertensive heart disease without heart failure: Secondary | ICD-10-CM | POA: Diagnosis not present

## 2022-03-17 DIAGNOSIS — I2510 Atherosclerotic heart disease of native coronary artery without angina pectoris: Secondary | ICD-10-CM

## 2022-03-17 DIAGNOSIS — R739 Hyperglycemia, unspecified: Secondary | ICD-10-CM | POA: Diagnosis not present

## 2022-03-17 DIAGNOSIS — R42 Dizziness and giddiness: Secondary | ICD-10-CM | POA: Diagnosis not present

## 2022-03-17 DIAGNOSIS — Z955 Presence of coronary angioplasty implant and graft: Secondary | ICD-10-CM | POA: Diagnosis not present

## 2022-03-17 LAB — BASIC METABOLIC PANEL
BUN: 20 mg/dL (ref 7–25)
CO2: 27 mmol/L (ref 20–32)
Calcium: 9.2 mg/dL (ref 8.6–10.3)
Chloride: 101 mmol/L (ref 98–110)
Creat: 0.92 mg/dL (ref 0.70–1.35)
Glucose, Bld: 146 mg/dL — ABNORMAL HIGH (ref 65–99)
Potassium: 4.5 mmol/L (ref 3.5–5.3)
Sodium: 134 mmol/L — ABNORMAL LOW (ref 135–146)

## 2022-03-17 LAB — CBC
HCT: 39.2 % (ref 38.5–50.0)
Hemoglobin: 13.7 g/dL (ref 13.2–17.1)
MCH: 32.6 pg (ref 27.0–33.0)
MCHC: 34.9 g/dL (ref 32.0–36.0)
MCV: 93.3 fL (ref 80.0–100.0)
MPV: 10.3 fL (ref 7.5–12.5)
Platelets: 286 10*3/uL (ref 140–400)
RBC: 4.2 10*6/uL (ref 4.20–5.80)
RDW: 12.2 % (ref 11.0–15.0)
WBC: 8.4 10*3/uL (ref 3.8–10.8)

## 2022-03-18 ENCOUNTER — Encounter (HOSPITAL_COMMUNITY): Payer: Self-pay

## 2022-03-18 ENCOUNTER — Encounter (HOSPITAL_COMMUNITY)
Admission: RE | Admit: 2022-03-18 | Discharge: 2022-03-18 | Disposition: A | Discharge status: Home / Self Care | Payer: Medicare HMO | Source: Ambulatory Visit | Attending: Internal Medicine | Admitting: Internal Medicine

## 2022-03-18 VITALS — BP 112/60 | HR 72 | Ht 71.0 in | Wt 211.2 lb

## 2022-03-18 DIAGNOSIS — Z955 Presence of coronary angioplasty implant and graft: Secondary | ICD-10-CM | POA: Insufficient documentation

## 2022-03-18 LAB — ECHOCARDIOGRAM COMPLETE
AR max vel: 2.21 cm2
AV Area VTI: 2.59 cm2
AV Area mean vel: 2.48 cm2
AV Mean grad: 6.1 mmHg
AV Peak grad: 11.4 mmHg
Ao pk vel: 1.69 m/s
Area-P 1/2: 3.46 cm2
Calc EF: 71.1 %
S' Lateral: 2.5 cm
Single Plane A2C EF: 71.2 %
Single Plane A4C EF: 70.1 %

## 2022-03-18 NOTE — Progress Notes (Addendum)
Cardiac Individual Treatment Plan  Patient Details  Name: Roger Powers MRN: [id number] Date of Birth: 11-23-1955 Referring Provider:   Flowsheet Row CARDIAC REHAB PHASE II ORIENTATION from 03/18/2022 in [place]  Referring Provider [name]       Initial Encounter Date:  Flowsheet Row CARDIAC REHAB PHASE II ORIENTATION from 03/18/2022 in [facility]  Date 03/18/22       Visit Diagnosis: S/P drug eluting coronary stent placement  Patient's Home Medications on Admission:  Current Outpatient Medications:    aspirin 81 MG tablet, Take 81 mg by mouth daily., Disp: , Rfl:    atorvastatin (LIPITOR) 80 MG tablet, Take 1 tablet (80 mg total) by mouth daily., Disp: 90 tablet, Rfl: 3   clobetasol (TEMOVATE) 0.05 % external solution, Apply 1 application  topically daily as needed (psoriasis)., Disp: , Rfl:    clonazePAM (KLONOPIN) 1 MG tablet, Take 1 mg by mouth at bedtime as needed for anxiety., Disp: , Rfl:    clopidogrel (PLAVIX) 75 MG tablet, Take 1 tablet (75 mg total) by mouth daily with breakfast., Disp: 90 tablet, Rfl: 3   diazepam (VALIUM) 10 MG tablet, Take 10 mg by mouth every 8 (eight) hours as needed for anxiety., Disp: , Rfl:    ezetimibe (ZETIA) 10 MG tablet, Take 1 tablet (10 mg total) by mouth daily., Disp: 90 tablet, Rfl: 3   hydrochlorothiazide (MICROZIDE) 12.5 MG capsule, Take 1 capsule (12.5 mg total) by mouth daily., Disp: 90 capsule, Rfl: 3   lisinopril (ZESTRIL) 40 MG tablet, Take 1 tablet (40 mg total) by mouth daily., Disp: 90 tablet, Rfl: 1   Multiple Vitamins-Minerals (MULTIVITAMIN MEN 50+ PO), Take 1 tablet by mouth daily., Disp: , Rfl:    nitroGLYCERIN (NITROSTAT) 0.4 MG SL tablet, Place 1 tablet (0.4 mg total) under the tongue every 5 (five) minutes as needed for chest pain., Disp: 100 tablet, Rfl: 3   OVER THE COUNTER MEDICATION, Take 1 tablet by mouth daily. Dynamic Brain Support, Disp: , Rfl:    OVER THE COUNTER  MEDICATION, Take 1 tablet by mouth daily. Immune Support Multivitamin, Disp: , Rfl:    pantoprazole (PROTONIX) 40 MG tablet, Take 1 tablet (40 mg total) by mouth daily., Disp: 90 tablet, Rfl: 3   zolpidem (AMBIEN) 10 MG tablet, Take 10 mg by mouth at bedtime as needed for sleep. , Disp: , Rfl:   Past Medical History: Past Medical History:  Diagnosis Date   CAD (coronary artery disease)    02/12/22: LHC DES 2.50x16 to midLAD, residual dz tx medically   Cataract    RIGHT eye only   GERD (gastroesophageal reflux disease)    hx of- with certain foods- on meds   Hypercholesteremia    on meds   Hypertension    on meds   Psoriasis     Tobacco Use: Social History   Tobacco Use  Smoking Status Former   Types: Cigarettes   Quit date: 11/18/1979   Years since quitting: 42.3  Smokeless Tobacco Former    Labs: Insurance account manager       Latest Ref Rng & Units 09/07/2016 09/29/2016  Labs for ITP Cardiac and Pulmonary Rehab  Cholestrol <200 mg/dL 222  134   LDL (calc) <100 mg/dL 135  71   HDL-C >40 mg/dL 32  26   Trlycerides <150 mg/dL 274  185     Capillary Blood Glucose: No results found for: "GLUCAP"   Exercise Target Goals: Exercise  Program Goal: Individual exercise prescription set using results from initial 6 min walk test and THRR while considering  patient's activity barriers and safety.   Exercise Prescription Goal: Starting with aerobic activity 30 plus minutes a day, 3 days per week for initial exercise prescription. Provide home exercise prescription and guidelines that participant acknowledges understanding prior to discharge.  Activity Barriers & Risk Stratification:  Activity Barriers & Cardiac Risk Stratification - 03/18/22 1256       Activity Barriers & Cardiac Risk Stratification   Activity Barriers Shortness of Breath    Cardiac Risk Stratification High             6 Minute Walk:  6 Minute Walk     Row Name 03/18/22 1401         6 Minute Walk    Phase Initial     Distance 1550 feet     Walk Time 6 minutes     # of Rest Breaks 0     MPH 2.93     METS 3.22     RPE 12     VO2 Peak 11.27     Symptoms No     Resting HR 72 bpm     Resting BP 112/60     Resting Oxygen Saturation  96 %     Exercise Oxygen Saturation  during 6 min walk 94 %     Max Ex. HR 84 bpm     Max Ex. BP 130/60     2 Minute Post BP 112/60              Oxygen Initial Assessment:   Oxygen Re-Evaluation:   Oxygen Discharge (Final Oxygen Re-Evaluation):   Initial Exercise Prescription:  Initial Exercise Prescription - 03/18/22 1400       Date of Initial Exercise RX and Referring Provider   Date 03/18/22    Referring Provider [name]    Expected Discharge Date 06/18/22      Treadmill   MPH 2    Grade 0    Minutes 17      Recumbant Elliptical   Level 1    RPM 60    Minutes 22      Prescription Details   Frequency (times per week) 3    Duration Progress to 30 minutes of continuous aerobic without signs/symptoms of physical distress      Intensity   THRR 40-80% of Max Heartrate 61-122    Ratings of Perceived Exertion 11-13      Resistance Training   Training Prescription Yes    Weight 4    Reps 10-15             Perform Capillary Blood Glucose checks as needed.  Exercise Prescription Changes:   Exercise Comments:   Exercise Goals and Review:   Exercise Goals     Row Name 03/18/22 1404             Exercise Goals   Increase Physical Activity Yes       Intervention Provide advice, education, support and counseling about physical activity/exercise needs.;Develop an individualized exercise prescription for aerobic and resistive training based on initial evaluation findings, risk stratification, comorbidities and participant's personal goals.       Expected Outcomes Short Term: Attend rehab on a regular basis to increase amount of physical activity.;Long Term: Add in home exercise to make exercise part of routine  and to increase amount of physical activity.;Long Term: Exercising regularly at least 3-5  days a week.       Increase Strength and Stamina Yes       Intervention Provide advice, education, support and counseling about physical activity/exercise needs.;Develop an individualized exercise prescription for aerobic and resistive training based on initial evaluation findings, risk stratification, comorbidities and participant's personal goals.       Expected Outcomes Short Term: Increase workloads from initial exercise prescription for resistance, speed, and METs.;Short Term: Perform resistance training exercises routinely during rehab and add in resistance training at home;Long Term: Improve cardiorespiratory fitness, muscular endurance and strength as measured by increased METs and functional capacity (6MWT)       Able to understand and use rate of perceived exertion (RPE) scale Yes       Intervention Provide education and explanation on how to use RPE scale       Expected Outcomes Short Term: Able to use RPE daily in rehab to express subjective intensity level;Long Term:  Able to use RPE to guide intensity level when exercising independently       Knowledge and understanding of Target Heart Rate Range (THRR) Yes       Intervention Provide education and explanation of THRR including how the numbers were predicted and where they are located for reference       Expected Outcomes Short Term: Able to state/look up THRR;Short Term: Able to use daily as guideline for intensity in rehab;Long Term: Able to use THRR to govern intensity when exercising independently       Able to check pulse independently Yes       Intervention Provide education and demonstration on how to check pulse in carotid and radial arteries.;Review the importance of being able to check your own pulse for safety during independent exercise       Expected Outcomes Short Term: Able to explain why pulse checking is important during independent  exercise;Long Term: Able to check pulse independently and accurately       Understanding of Exercise Prescription Yes       Intervention Provide education, explanation, and written materials on patient's individual exercise prescription       Expected Outcomes Short Term: Able to explain program exercise prescription;Long Term: Able to explain home exercise prescription to exercise independently                Exercise Goals Re-Evaluation :    Discharge Exercise Prescription (Final Exercise Prescription Changes):   Nutrition:  Target Goals: Understanding of nutrition guidelines, daily intake of sodium '1500mg'$ , cholesterol '200mg'$ , calories 30% from fat and 7% or less from saturated fats, daily to have 5 or more servings of fruits and vegetables.  Biometrics:  Pre Biometrics - 03/18/22 1404       Pre Biometrics   Height '5\' 11"'$  (1.803 m)    Weight 95.8 kg    Waist Circumference 41 inches    Hip Circumference 39.5 inches    Waist to Hip Ratio 1.04 %    BMI (Calculated) 29.47    Triceps Skinfold 10 mm    % Body Fat 26.7 %    Grip Strength 44.4 kg    Flexibility 6.5 in    Single Leg Stand 40.48 seconds              Nutrition Therapy Plan and Nutrition Goals:  Nutrition Therapy & Goals - 03/18/22 1354       Personal Nutrition Goals   Comments Patient scored 15 on his diet assessment. He says he cooks all  of his meals and follows a low sodium diet. Handout explained and provided regarding healthier choices. We offer 2 educational sessions on heart healthy nutiriton with handouts and assistance with RD referral if patient is interested.      Intervention Plan   Intervention Nutrition handout(s) given to patient.             Nutrition Assessments:  Nutrition Assessments - 03/18/22 1353       MEDFICTS Scores   Pre Score 15            MEDIFICTS Score Key: ?70 Need to make dietary changes  40-70 Heart Healthy Diet ? 40 Therapeutic Level Cholesterol  Diet   Picture Your Plate Scores: <35 Unhealthy dietary pattern with much room for improvement. 41-50 Dietary pattern unlikely to meet recommendations for good health and room for improvement. 51-60 More healthful dietary pattern, with some room for improvement.  >60 Healthy dietary pattern, although there may be some specific behaviors that could be improved.    Nutrition Goals Re-Evaluation:   Nutrition Goals Discharge (Final Nutrition Goals Re-Evaluation):   Psychosocial: Target Goals: Acknowledge presence or absence of significant depression and/or stress, maximize coping skills, provide positive support system. Participant is able to verbalize types and ability to use techniques and skills needed for reducing stress and depression.  Initial Review & Psychosocial Screening:  Initial Psych Review & Screening - 03/18/22 1359       Initial Review   Current issues with Current Sleep Concerns;Current Psychotropic Meds      Family Dynamics   Good Support System? Yes      Barriers   Psychosocial barriers to participate in program There are no identifiable barriers or psychosocial needs.;The patient should benefit from training in stress management and relaxation.      Screening Interventions   Interventions Encouraged to exercise;Provide feedback about the scores to participant    Expected Outcomes Short Term goal: Identification and review with participant of any Quality of Life or Depression concerns found by scoring the questionnaire.             Quality of Life Scores:  Quality of Life - 03/18/22 1405       Quality of Life   Select Quality of Life      Quality of Life Scores   Health/Function Pre 11.97 %    Socioeconomic Pre 17.5 %    Psych/Spiritual Pre 16.93 %    Family Pre 13.8 %    GLOBAL Pre 14.4 %            Scores of 19 and below usually indicate a poorer quality of life in these areas.  A difference of  2-3 points is a clinically meaningful  difference.  A difference of 2-3 points in the total score of the Quality of Life Index has been associated with significant improvement in overall quality of life, self-image, physical symptoms, and general health in studies assessing change in quality of life.  PHQ-9: Review Flowsheet       03/18/2022  Depression screen PHQ 2/9  Decreased Interest 0  Down, Depressed, Hopeless 0  PHQ - 2 Score 0  Altered sleeping 2  Tired, decreased energy 3  Change in appetite 1  Feeling bad or failure about yourself  0  Trouble concentrating 0  Moving slowly or fidgety/restless 0  Suicidal thoughts 0  PHQ-9 Score 6  Difficult doing work/chores Somewhat difficult   Interpretation of Total Score  Total Score Depression Severity:  1-4 = Minimal depression, 5-9 = Mild depression, 10-14 = Moderate depression, 15-19 = Moderately severe depression, 20-27 = Severe depression   Psychosocial Evaluation and Intervention:  Psychosocial Evaluation - 03/18/22 1400       Psychosocial Evaluation & Interventions   Interventions Stress management education;Relaxation education;Encouraged to exercise with the program and follow exercise prescription    Comments Patient has no psychosocial barriers identified to participate in the program. His PHQ-9 score was 6 due to sleep, lack of energy and overeating at times. He denies any depression or anxiety. He says he has had trouble sleeping since his son was born who is 42. He is a special needs adult child and has seizures and patient says this effects his sleep thinking his son may have a seizure during the night. He has clonzapam, valium and ambien prescribed for sleep and uses them only occasionally. They help but he fears he will not be able to hear his son if he needs him during the night. Patient says he and his ex-wife have responsibiltiy for his son but he is with him the majority of the time. He does have a paid caregiver that helps with his son's care. 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 He says he has good support from his girl friend and his paid caregiver who has been helping him for many years. He is ready to start the program.    Expected Outcomes Patient will continue to have no psychosocial barriers identified.    Continue Psychosocial Services  No Follow up required             Psychosocial Re-Evaluation:   Psychosocial Discharge (Final Psychosocial Re-Evaluation):   Vocational Rehabilitation: Provide vocational rehab assistance to qualifying candidates.   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             Education: Education Goals: Education classes will be provided on a weekly basis, covering required topics. Participant will state understanding/return demonstration of topics presented.  Learning Barriers/Preferences:  Learning Barriers/Preferences - 03/18/22 1356       Learning Barriers/Preferences   Learning Barriers None    Learning Preferences Written Material             Education Topics: Hypertension, Hypertension Reduction -Define heart disease and high blood pressure. Discus how high blood pressure affects the body and ways to reduce high blood pressure.   Exercise and Your Heart -Discuss why it is important to exercise, the FITT principles of exercise, normal and abnormal responses to exercise, and how to exercise safely.   Angina -Discuss definition of angina, causes of angina, treatment of angina, and how to decrease risk of having angina.   Cardiac Medications -Review what the following cardiac medications are used for, how they affect the body,  and side effects that may occur when taking the medications.  Medications include Aspirin, Beta blockers, calcium channel blockers, ACE Inhibitors, angiotensin receptor blockers, diuretics, digoxin, and antihyperlipidemics.   Congestive Heart Failure -Discuss the definition of CHF, how to live with CHF, the signs and symptoms of CHF, and how keep track of weight and sodium intake.  Heart Disease and Intimacy -Discus the effect sexual activity has on the heart, how changes occur during intimacy as we age, and safety during sexual activity.   Smoking Cessation / COPD -Discuss different methods to quit smoking, the health benefits of quitting smoking, and the definition of COPD.   Nutrition I: Fats -Discuss the types of cholesterol, what cholesterol does to the heart, and how cholesterol levels can be controlled.   Nutrition II: Labels -Discuss the different components of food labels and how to read food label   Heart Parts/Heart Disease and PAD -Discuss the anatomy of the heart, the pathway of blood circulation through the heart, and these are affected by heart disease.   Stress I: Signs and Symptoms -Discuss the causes of stress, how stress may lead to anxiety and depression, and ways to limit stress.   Stress II: Relaxation -Discuss different types of relaxation techniques to limit stress.   Warning Signs of Stroke / TIA -Discuss definition of a stroke, what the signs and symptoms are of a stroke, and how to identify when someone is having stroke.   Knowledge Questionnaire Score:  Knowledge Questionnaire Score - 03/18/22 1356       Knowledge Questionnaire Score   Pre Score 21/24             Core Components/Risk Factors/Patient Goals at Admission:  Personal Goals and Risk Factors at Admission - 03/18/22 1357       Core Components/Risk Factors/Patient Goals on Admission    Weight Management Weight Maintenance    Improve shortness of breath with ADL's Yes     Intervention Provide education, individualized exercise plan and daily activity instruction to help decrease symptoms of SOB with activities of daily living.    Expected Outcomes Short Term: Improve cardiorespiratory fitness to achieve a reduction of symptoms when performing ADLs;Long Term: Be able to perform more ADLs without symptoms or delay the onset of symptoms    Hypertension Yes    Intervention Provide education on lifestyle modifcations including regular physical activity/exercise, weight management, moderate sodium restriction and increased consumption of fresh fruit, vegetables, and low fat dairy, alcohol moderation, and smoking cessation.;Monitor prescription use compliance.    Expected Outcomes Short Term: Continued assessment and intervention until BP is < 140/65m HG in hypertensive participants. < 130/82mHG in hypertensive participants with diabetes, heart failure or chronic kidney disease.;Long Term: Maintenance of blood pressure at goal levels.    Lipids Yes    Intervention Provide education and support for participant on nutrition & aerobic/resistive exercise along with prescribed medications to achieve LDL '70mg'$ , HDL >'40mg'$ .    Expected Outcomes Short Term: Participant states understanding of desired cholesterol values and is compliant with medications prescribed. Participant is following exercise prescription and nutrition guidelines.    Personal Goal Other Yes    Personal Goal Patient wants to get healthier; learn how to exercise; to be able to continue to do the activities he is doing; and improve his SOB.    Intervention Patient will attend CR 3 days/week with exercise and education.    Expected Outcomes Patient will complete the program meeting both personal and program goals.             Core Components/Risk Factors/Patient Goals Review:    Core Components/Risk Factors/Patient Goals at Discharge (Final Review):    ITP Comments:   Comments: Patient arrived for 1st  visit/orientation/education at 1230. Patient was referred to CR by Dr. VaIrish Lackue to Status Post Drug Eluting Stent placement (  Z95.). During orientation advised patient on arrival and appointment times what to wear, what to do before, during and after exercise. Reviewed attendance and class policy.  Pt is scheduled to return Cardiac Rehab on 03/29/22 at 815. Pt was advised to come to class 15 minutes before class starts.  Discussed RPE/Dpysnea scales. Patient participated in warm up stretches. Patient was able to complete 6 minute walk test.  Telemetry:NSR. Patient was measured for the equipment. Discussed equipment safety with patient. Took patient pre-anthropometric measurements. Patient finished visit at 1400.

## 2022-03-19 ENCOUNTER — Telehealth: Payer: Self-pay | Admitting: Internal Medicine

## 2022-03-19 NOTE — Telephone Encounter (Signed)
Patient returned RN's call regarding results. 

## 2022-03-19 NOTE — Telephone Encounter (Signed)
Patient was returning the clinic phone call. The patient has been notified of the result and verbalized understanding.  All questions (if any) were answered. Gershon Crane, LPN 0/07/3492 9:44 AM

## 2022-03-22 ENCOUNTER — Encounter (HOSPITAL_COMMUNITY)
Admission: RE | Admit: 2022-03-22 | Discharge: 2022-03-22 | Disposition: A | Discharge status: Home / Self Care | Payer: Medicare HMO | Source: Ambulatory Visit | Attending: Internal Medicine | Admitting: Internal Medicine

## 2022-03-22 DIAGNOSIS — Z955 Presence of coronary angioplasty implant and graft: Secondary | ICD-10-CM

## 2022-03-22 NOTE — Progress Notes (Signed)
Daily Session Note  Patient Details  Name: Roger Powers MRN: [id number] Date of Birth: 23-Jul-1955 Referring Provider:   Flowsheet Row CARDIAC REHAB PHASE II ORIENTATION from 03/18/2022 in [place]  Referring Provider [name]       Encounter Date: 03/22/2022  Check In:  Session Check In - 03/22/22 0815       Check-In   Supervising physician immediately available to respond to emergencies [facility] MD immediately available    Physician(s) Dr. Harl Bowie    Location [facility]    Staff Present [name] MHA, MS, ACSM-CEP;Madelyn Flavors, RN, BSN;Heather Mel Almond, BS, Exercise Physiologist    Virtual Visit No    Medication changes reported     No    Fall or balance concerns reported    No    Tobacco Cessation No Change    Warm-up and Cool-down Performed as group-led instruction    Resistance Training Performed Yes    VAD Patient? No    PAD/SET Patient? No      Pain Assessment   Currently in Pain? No/denies    Pain Score 0-No pain    Multiple Pain Sites No             Capillary Blood Glucose: No results found for this or any previous visit (from the past 24 hour(s)).    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

## 2022-03-24 ENCOUNTER — Encounter (HOSPITAL_COMMUNITY)
Admission: RE | Admit: 2022-03-24 | Discharge: 2022-03-24 | Disposition: A | Discharge status: Home / Self Care | Payer: Medicare HMO | Source: Ambulatory Visit | Attending: Internal Medicine | Admitting: Internal Medicine

## 2022-03-24 DIAGNOSIS — Z955 Presence of coronary angioplasty implant and graft: Secondary | ICD-10-CM

## 2022-03-24 DIAGNOSIS — I2510 Atherosclerotic heart disease of native coronary artery without angina pectoris: Secondary | ICD-10-CM | POA: Diagnosis not present

## 2022-03-24 DIAGNOSIS — I10 Essential (primary) hypertension: Secondary | ICD-10-CM | POA: Diagnosis not present

## 2022-03-24 NOTE — Progress Notes (Signed)
Daily Session Note  Patient Details  Name: Roger Powers MRN: [id number] Date of Birth: 1955/04/02 Referring Provider:   Flowsheet Row CARDIAC REHAB PHASE II ORIENTATION from 03/18/2022 in [place]  Referring Provider [name]       Encounter Date: 03/24/2022  Check In:  Session Check In - 03/24/22 0815       Check-In   Supervising physician immediately available to respond to emergencies [facility] MD immediately available    Physician(s) Dr. Harl Bowie    Location [facility]    Staff Present [name] MHA, MS, ACSM-CEP;Melven Sartorius BSN, RN    Virtual Visit No    Medication changes reported     No    Fall or balance concerns reported    No    Tobacco Cessation No Change    Warm-up and Cool-down Performed as group-led Higher education careers adviser Performed Yes    VAD Patient? No    PAD/SET Patient? No      Pain Assessment   Currently in Pain? No/denies    Pain Score 0-No pain    Multiple Pain Sites No             Capillary Blood Glucose: No results found for this or any previous visit (from the past 24 hour(s)).    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

## 2022-03-26 ENCOUNTER — Encounter (HOSPITAL_COMMUNITY)
Admission: RE | Admit: 2022-03-26 | Discharge: 2022-03-26 | Disposition: A | Discharge status: Home / Self Care | Payer: Medicare HMO | Source: Ambulatory Visit | Attending: Internal Medicine | Admitting: Internal Medicine

## 2022-03-26 DIAGNOSIS — Z955 Presence of coronary angioplasty implant and graft: Secondary | ICD-10-CM

## 2022-03-26 NOTE — Progress Notes (Signed)
Daily Session Note  Patient Details  Name: JAIVEER TRABERT MRN: [id number] Date of Birth: [birth year] Referring Provider:   Flowsheet Row CARDIAC REHAB PHASE II ORIENTATION from 03/18/2022 in [place]  Referring Provider [name]       Encounter Date: 03/26/2022  Check In:  Session Check In - 03/26/22 0821       Check-In   Supervising physician immediately available to respond to emergencies [facility] MD immediately available    Physician(s) Dr. Harl Bowie    Location [facility]    Staff Present [name], BS, Exercise Physiologist;Dalton Sherrie George, MS, ACSM-CEP;Geanie Cooley, RN;Debra Wynetta Emery, RN, Joanette Gula, RN, BSN    Virtual Visit No    Medication changes reported     No    Fall or balance concerns reported    No    Tobacco Cessation No Change    Warm-up and Cool-down Performed as group-led instruction    Resistance Training Performed Yes    VAD Patient? No    PAD/SET Patient? No      Pain Assessment   Currently in Pain? No/denies    Pain Score 0-No pain    Multiple Pain Sites No             Capillary Blood Glucose: No results found for this or any previous visit (from the past 24 hour(s)).    Social History   Tobacco Use  Smoking Status Former   Types: Cigarettes   Quit date: 11/18/1979   Years since quitting: 42.3  Smokeless Tobacco Former    Goals Met:  Independence with exercise equipment Exercise tolerated well Strength training completed today  Goals Unmet:  Not Applicable  Comments: check out @ 9:15am   [name] is Market researcher for [place]

## 2022-03-29 ENCOUNTER — Encounter (HOSPITAL_COMMUNITY)
Admission: RE | Admit: 2022-03-29 | Discharge: 2022-03-29 | Disposition: A | Discharge status: Home / Self Care | Payer: Medicare HMO | Source: Ambulatory Visit | Attending: Internal Medicine | Admitting: Internal Medicine

## 2022-03-29 VITALS — Wt 209.9 lb

## 2022-03-29 DIAGNOSIS — Z955 Presence of coronary angioplasty implant and graft: Secondary | ICD-10-CM

## 2022-03-29 NOTE — Progress Notes (Signed)
Daily Session Note  Patient Details  Name: Roger Powers MRN: [id number] Date of Birth: October 13, 1955 Referring Provider:   Flowsheet Row CARDIAC REHAB PHASE II ORIENTATION from 03/18/2022 in [place]  Referring Provider [name]       Encounter Date: 03/29/2022  Check In:  Session Check In - 03/29/22 0815       Check-In   Supervising physician immediately available to respond to emergencies [facility] MD immediately available    Physician(s) Dr. Domenic Polite    Location [facility]    Staff Present [name], BS, Exercise Physiologist;Dalton Sherrie George, MS, ACSM-CEP;Geanie Cooley, RN;Debra Johnson, RN, BSN    Virtual Visit No    Medication changes reported     No    Fall or balance concerns reported    No    Tobacco Cessation No Change    Warm-up and Cool-down Performed as group-led Higher education careers adviser Performed Yes    VAD Patient? No    PAD/SET Patient? No      Pain Assessment   Currently in Pain? No/denies    Pain Score 0-No pain    Multiple Pain Sites No             Capillary Blood Glucose: No results found for this or any previous visit (from the past 24 hour(s)).    Social History   Tobacco Use  Smoking Status Former   Types: Cigarettes   Quit date: 11/18/1979   Years since quitting: 42.3  Smokeless Tobacco Former    Goals Met:  Independence with exercise equipment Exercise tolerated well No report of concerns or symptoms today  Goals Unmet:  Not Applicable  Comments: check out @ 9:15am   [name] is Medical Director for [place]

## 2022-03-31 ENCOUNTER — Encounter (HOSPITAL_COMMUNITY)
Admission: RE | Admit: 2022-03-31 | Discharge: 2022-03-31 | Disposition: A | Discharge status: Home / Self Care | Payer: Medicare HMO | Source: Ambulatory Visit | Attending: Internal Medicine | Admitting: Internal Medicine

## 2022-03-31 DIAGNOSIS — Z955 Presence of coronary angioplasty implant and graft: Secondary | ICD-10-CM | POA: Diagnosis not present

## 2022-03-31 NOTE — Progress Notes (Deleted)
Daily Session Note  Patient Details  Name: Roger Powers MRN: [id number] Date of Birth: 11/11/1955 Referring Provider:   Flowsheet Row CARDIAC REHAB PHASE II ORIENTATION from 03/18/2022 in [place]  Referring Provider [name]       Encounter Date: 03/31/2022  Check In:  Session Check In - 03/31/22 0815       Check-In   Supervising physician immediately available to respond to emergencies [facility] MD immediately available    Physician(s) Dr. Domenic Polite    Location [facility]    Staff Present [name] MHA, MS, ACSM-CEP;Melven Sartorius BSN, RN    Virtual Visit No    Medication changes reported     No    Fall or balance concerns reported    No    Tobacco Cessation No Change    Warm-up and Cool-down Performed as group-led Higher education careers adviser Performed Yes    VAD Patient? No    PAD/SET Patient? No      Pain Assessment   Currently in Pain? No/denies    Pain Score 0-No pain    Multiple Pain Sites No             Capillary Blood Glucose: No results found for this or any previous visit (from the past 24 hour(s)).    Social History   Tobacco Use  Smoking Status Former   Types: Cigarettes   Quit date: 11/18/1979   Years since quitting: 42.3  Smokeless Tobacco Former    Goals Met:  Independence with exercise equipment Exercise tolerated well No report of concerns or symptoms today Strength training completed today  Goals Unmet:  Not Applicable  Comments: checkout time is 0915   [name] is Market researcher for [place]

## 2022-03-31 NOTE — Progress Notes (Signed)
Daily Session Note  Patient Details  Name: Roger Powers MRN: [id number] Date of Birth: 07/13/1955 Referring Provider:   Flowsheet Row CARDIAC REHAB PHASE II ORIENTATION from 03/18/2022 in [place]  Referring Provider [name]       Encounter Date: 03/31/2022  Check In:  Session Check In - 03/31/22 0815       Check-In   Supervising physician immediately available to respond to emergencies [facility] MD immediately available    Physician(s) Dr. Domenic Polite    Location [facility]    Staff Present [name] MHA, MS, ACSM-CEP;Melven Sartorius BSN, RN    Virtual Visit No    Medication changes reported     No    Fall or balance concerns reported    No    Tobacco Cessation No Change    Warm-up and Cool-down Performed as group-led Higher education careers adviser Performed Yes    VAD Patient? No    PAD/SET Patient? No      Pain Assessment   Currently in Pain? No/denies    Pain Score 0-No pain    Multiple Pain Sites No             Capillary Blood Glucose: No results found for this or any previous visit (from the past 24 hour(s)).    Social History   Tobacco Use  Smoking Status Former   Types: Cigarettes   Quit date: 11/18/1979   Years since quitting: 42.3  Smokeless Tobacco Former    Goals Met:  Independence with exercise equipment Exercise tolerated well No report of concerns or symptoms today Strength training completed today  Goals Unmet:  Not Applicable  Comments: checkout time is 0915   [name] is Market researcher for [place]

## 2022-04-02 ENCOUNTER — Encounter (HOSPITAL_COMMUNITY)
Admission: RE | Admit: 2022-04-02 | Discharge: 2022-04-02 | Disposition: A | Discharge status: Home / Self Care | Payer: Medicare HMO | Source: Ambulatory Visit | Attending: Internal Medicine | Admitting: Internal Medicine

## 2022-04-02 DIAGNOSIS — Z955 Presence of coronary angioplasty implant and graft: Secondary | ICD-10-CM

## 2022-04-02 NOTE — Progress Notes (Signed)
Daily Session Note  Patient Details  Name: Roger Powers MRN: [id number] Date of Birth: [birth year] Referring Provider:   Flowsheet Row CARDIAC REHAB PHASE II ORIENTATION from 03/18/2022 in [place]  Referring Provider [name]       Encounter Date: 04/02/2022  Check In:  Session Check In - 04/02/22 0810       Check-In   Supervising physician immediately available to respond to emergencies [facility] MD immediately available    Physician(s) Dr Johnsie Cancel    Location [facility]    Staff Present [name], BS, Exercise Physiologist;Mairi Stagliano Hassell Done, RN, BSN    Virtual Visit No    Medication changes reported     No    Fall or balance concerns reported    No    Tobacco Cessation No Change    Warm-up and Cool-down Performed as group-led instruction    Resistance Training Performed Yes    VAD Patient? No    PAD/SET Patient? No      Pain Assessment   Currently in Pain? No/denies    Pain Score 0-No pain    Multiple Pain Sites No             Capillary Blood Glucose: No results found for this or any previous visit (from the past 24 hour(s)).    Social History   Tobacco Use  Smoking Status Former   Types: Cigarettes   Quit date: 11/18/1979   Years since quitting: 42.4  Smokeless Tobacco Former    Goals Met:  Independence with exercise equipment Exercise tolerated well No report of concerns or symptoms today Strength training completed today  Goals Unmet:  Not Applicable  Comments: Checkout at [place].   [name] is Medical Director for [facility]

## 2022-04-05 ENCOUNTER — Encounter (HOSPITAL_COMMUNITY)
Admission: RE | Admit: 2022-04-05 | Discharge: 2022-04-05 | Disposition: A | Discharge status: Home / Self Care | Payer: Medicare HMO | Source: Ambulatory Visit | Attending: Internal Medicine | Admitting: Internal Medicine

## 2022-04-05 DIAGNOSIS — Z955 Presence of coronary angioplasty implant and graft: Secondary | ICD-10-CM | POA: Diagnosis not present

## 2022-04-05 NOTE — Progress Notes (Signed)
Daily Session Note  Patient Details  Name: Roger Powers MRN: [id number] Date of Birth: 1955-04-16 Referring Provider:   Flowsheet Row CARDIAC REHAB PHASE II ORIENTATION from 03/18/2022 in [place]  Referring Provider [name]       Encounter Date: 04/05/2022  Check In:  Session Check In - 04/05/22 0812       Check-In   Supervising physician immediately available to respond to emergencies [facility] MD immediately available    Physician(s) [name]    Location [facility]    Staff Present [name], BS, Exercise Physiologist;Daphyne Hassell Done, RN, BSN    Virtual Visit No    Medication changes reported     No    Fall or balance concerns reported    No    Tobacco Cessation No Change    Warm-up and Cool-down Performed as group-led instruction    Resistance Training Performed Yes    VAD Patient? No    PAD/SET Patient? No      Pain Assessment   Currently in Pain? No/denies    Pain Score 0-No pain    Multiple Pain Sites No             Capillary Blood Glucose: No results found for this or any previous visit (from the past 24 hour(s)).    Social History   Tobacco Use  Smoking Status Former   Types: Cigarettes   Quit date: 11/18/1979   Years since quitting: 42.4  Smokeless Tobacco Former    Goals Met:  Independence with exercise equipment Exercise tolerated well No report of concerns or symptoms today Strength training completed today  Goals Unmet:  Not Applicable  Comments: check out 0915   [name] is Medical Director for [place]

## 2022-04-07 ENCOUNTER — Encounter (HOSPITAL_COMMUNITY)
Admission: RE | Admit: 2022-04-07 | Discharge: 2022-04-07 | Disposition: A | Discharge status: Home / Self Care | Payer: Medicare HMO | Source: Ambulatory Visit | Attending: Internal Medicine | Admitting: Internal Medicine

## 2022-04-07 DIAGNOSIS — Z955 Presence of coronary angioplasty implant and graft: Secondary | ICD-10-CM

## 2022-04-07 NOTE — Progress Notes (Signed)
Daily Session Note  Patient Details  Name: Roger Powers MRN: [id number] Date of Birth: 23-Sep-1955 Referring Provider:   Flowsheet Row CARDIAC REHAB PHASE II ORIENTATION from 03/18/2022 in [place]  Referring Provider [name]       Encounter Date: 04/07/2022  Check In:  Session Check In - 04/07/22 0815       Check-In   Supervising physician immediately available to respond to emergencies [facility] MD immediately available    Physician(s) [name]    Location [facility]    Staff Present [name] MHA, MS, ACSM-CEP;Debra Wynetta Emery, RN, BSN    Virtual Visit No    Medication changes reported     No    Fall or balance concerns reported    No    Tobacco Cessation No Change    Warm-up and Cool-down Performed as group-led instruction    Resistance Training Performed Yes    VAD Patient? No    PAD/SET Patient? No      Pain Assessment   Currently in Pain? No/denies    Pain Score 0-No pain    Multiple Pain Sites No             Capillary Blood Glucose: No results found for this or any previous visit (from the past 24 hour(s)).    Social History   Tobacco Use  Smoking Status Former   Types: Cigarettes   Quit date: 11/18/1979   Years since quitting: 42.4  Smokeless Tobacco Former    Goals Met:  Independence with exercise equipment Exercise tolerated well No report of concerns or symptoms today Strength training completed today  Goals Unmet:  Not Applicable  Comments: checkout time is 0915   [name] is Market researcher for [place]

## 2022-04-09 ENCOUNTER — Encounter (HOSPITAL_COMMUNITY)
Admission: RE | Admit: 2022-04-09 | Discharge: 2022-04-09 | Disposition: A | Discharge status: Home / Self Care | Payer: Medicare HMO | Source: Ambulatory Visit | Attending: Internal Medicine | Admitting: Internal Medicine

## 2022-04-09 DIAGNOSIS — Z955 Presence of coronary angioplasty implant and graft: Secondary | ICD-10-CM | POA: Diagnosis not present

## 2022-04-09 NOTE — Progress Notes (Signed)
Daily Session Note  Patient Details  Name: Roger Powers MRN: [id number] Date of Birth: 1955-10-05 Referring Provider:   Flowsheet Row CARDIAC REHAB PHASE II ORIENTATION from 03/18/2022 in [place]  Referring Provider Roger Powers       Encounter Date: 04/09/2022  Check In:  Session Check In - 04/09/22 0815       Check-In   Supervising physician immediately available to respond to emergencies [facility] MD immediately available    Physician(s) Roger Powers    Location [facility]    Staff Present Roger Powers MHA, MS, ACSM-CEP;Roger Powers, BS, Exercise Physiologist    Virtual Visit No    Medication changes reported     No    Fall or balance concerns reported    No    Tobacco Cessation No Change    Warm-up and Cool-down Performed as group-led instruction    Resistance Training Performed Yes    VAD Patient? No    PAD/SET Patient? No      Pain Assessment   Currently in Pain? No/denies    Pain Score 0-No pain    Multiple Pain Sites No             Capillary Blood Glucose: No results found for this or any previous visit (from the past 24 hour(s)).    Social History   Tobacco Use  Smoking Status Former   Types: Cigarettes   Quit date: 11/18/1979   Years since quitting: 42.4  Smokeless Tobacco Former    Goals Met:  Independence with exercise equipment Exercise tolerated well No report of concerns or symptoms today Strength training completed today  Goals Unmet:  Not Applicable  Comments: checkout time is 0915   Roger Powers is Market researcher for [place]

## 2022-04-12 ENCOUNTER — Encounter (HOSPITAL_COMMUNITY)
Admission: RE | Admit: 2022-04-12 | Discharge: 2022-04-12 | Disposition: A | Discharge status: Home / Self Care | Payer: Medicare HMO | Source: Ambulatory Visit | Attending: Internal Medicine | Admitting: Internal Medicine

## 2022-04-12 VITALS — Wt 208.3 lb

## 2022-04-12 DIAGNOSIS — Z955 Presence of coronary angioplasty implant and graft: Secondary | ICD-10-CM | POA: Diagnosis not present

## 2022-04-12 NOTE — Progress Notes (Signed)
Daily Session Note  Patient Details  Name: Roger Powers MRN: [id number] Date of Birth: [birth year] Referring Provider:   Flowsheet Row CARDIAC REHAB PHASE II ORIENTATION from 03/18/2022 in [place]  Referring Provider [name]       Encounter Date: 04/12/2022  Check In:  Session Check In - 04/12/22 0807       Check-In   Supervising physician immediately available to respond to emergencies [facility] MD immediately available    Physician(s) Dr Harl Bowie    Location [facility]    Staff Present Simone Tuckey Hassell Done, RN, BSN;Heather Mel [place], BS, Exercise Physiologist    Virtual Visit No    Medication changes reported     No    Fall or balance concerns reported    No    Tobacco Cessation No Change    Warm-up and Cool-down Performed as group-led instruction    Resistance Training Performed Yes    VAD Patient? No    PAD/SET Patient? No      Pain Assessment   Currently in Pain? No/denies    Pain Score 0-No pain    Multiple Pain Sites No             Capillary Blood Glucose: No results found for this or any previous visit (from the past 24 hour(s)).    Social History   Tobacco Use  Smoking Status Former   Types: Cigarettes   Quit date: 11/18/1979   Years since quitting: 42.4  Smokeless Tobacco Former    Goals Met:  Independence with exercise equipment Exercise tolerated well No report of concerns or symptoms today Strength training completed today  Goals Unmet:  Not Applicable  Comments: Checkout at [place].   [name] is Medical Director for [facility]

## 2022-04-14 ENCOUNTER — Encounter (HOSPITAL_COMMUNITY)
Admission: RE | Admit: 2022-04-14 | Discharge: 2022-04-14 | Disposition: A | Discharge status: Home / Self Care | Payer: Medicare HMO | Source: Ambulatory Visit | Attending: Internal Medicine | Admitting: Internal Medicine

## 2022-04-14 DIAGNOSIS — Z955 Presence of coronary angioplasty implant and graft: Secondary | ICD-10-CM

## 2022-04-14 NOTE — Progress Notes (Signed)
Cardiac Individual Treatment Plan  Patient Details  Name: Roger Powers MRN: [id number] Date of Birth: 04-05-1955 Referring Provider:   Flowsheet Row CARDIAC REHAB PHASE II ORIENTATION from 03/18/2022 in [facility]  Referring Provider [name]       Initial Encounter Date:  Flowsheet Row CARDIAC REHAB PHASE II ORIENTATION from 03/18/2022 in [facility]  Date 03/18/22       Visit Diagnosis: No diagnosis found.  Patient's Home Medications on Admission:  Current Outpatient Medications:    aspirin 81 MG tablet, Take 81 mg by mouth daily., Disp: , Rfl:    atorvastatin (LIPITOR) 80 MG tablet, Take 1 tablet (80 mg total) by mouth daily., Disp: 90 tablet, Rfl: 3   clobetasol (TEMOVATE) 0.05 % external solution, Apply 1 application  topically daily as needed (psoriasis)., Disp: , Rfl:    clonazePAM (KLONOPIN) 1 MG tablet, Take 1 mg by mouth at bedtime as needed for anxiety., Disp: , Rfl:    clopidogrel (PLAVIX) 75 MG tablet, Take 1 tablet (75 mg total) by mouth daily with breakfast., Disp: 90 tablet, Rfl: 3   diazepam (VALIUM) 10 MG tablet, Take 10 mg by mouth every 8 (eight) hours as needed for anxiety., Disp: , Rfl:    ezetimibe (ZETIA) 10 MG tablet, Take 1 tablet (10 mg total) by mouth daily., Disp: 90 tablet, Rfl: 3   hydrochlorothiazide (MICROZIDE) 12.5 MG capsule, Take 1 capsule (12.5 mg total) by mouth daily., Disp: 90 capsule, Rfl: 3   lisinopril (ZESTRIL) 40 MG tablet, Take 1 tablet (40 mg total) by mouth daily., Disp: 90 tablet, Rfl: 1   Multiple Vitamins-Minerals (MULTIVITAMIN MEN 50+ PO), Take 1 tablet by mouth daily., Disp: , Rfl:    nitroGLYCERIN (NITROSTAT) 0.4 MG SL tablet, Place 1 tablet (0.4 mg total) under the tongue every 5 (five) minutes as needed for chest pain., Disp: 100 tablet, Rfl: 3   OVER THE COUNTER MEDICATION, Take 1 tablet by mouth daily. Dynamic Brain Support, Disp: , Rfl:    OVER THE COUNTER MEDICATION, Take 1  tablet by mouth daily. Immune Support Multivitamin, Disp: , Rfl:    pantoprazole (PROTONIX) 40 MG tablet, Take 1 tablet (40 mg total) by mouth daily., Disp: 90 tablet, Rfl: 3   zolpidem (AMBIEN) 10 MG tablet, Take 10 mg by mouth at bedtime as needed for sleep. , Disp: , Rfl:   Past Medical History: Past Medical History:  Diagnosis Date   CAD (coronary artery disease)    02/12/22: LHC DES 2.50x16 to midLAD, residual dz tx medically   Cataract    RIGHT eye only   GERD (gastroesophageal reflux disease)    hx of- with certain foods- on meds   Hypercholesteremia    on meds   Hypertension    on meds   Psoriasis     Tobacco Use: Social History   Tobacco Use  Smoking Status Former   Types: Cigarettes   Quit date: 11/18/1979   Years since quitting: 42.4  Smokeless Tobacco Former    Labs: Insurance account manager       Latest Ref Rng & Units 09/07/2016 09/29/2016  Labs for ITP Cardiac and Pulmonary Rehab  Cholestrol <200 mg/dL 222  134   LDL (calc) <100 mg/dL 135  71   HDL-C >40 mg/dL 32  26   Trlycerides <150 mg/dL 274  185     Capillary Blood Glucose: No results found for: "GLUCAP"   Exercise Target Goals: Exercise Program Goal: Individual  exercise prescription set using results from initial 6 min walk test and THRR while considering  patient's activity barriers and safety.   Exercise Prescription Goal: Starting with aerobic activity 30 plus minutes a day, 3 days per week for initial exercise prescription. Provide home exercise prescription and guidelines that participant acknowledges understanding prior to discharge.  Activity Barriers & Risk Stratification:  Activity Barriers & Cardiac Risk Stratification - 03/18/22 1256       Activity Barriers & Cardiac Risk Stratification   Activity Barriers Shortness of Breath    Cardiac Risk Stratification High             6 Minute Walk:  6 Minute Walk     Row Name 03/18/22 1401         6 Minute Walk   Phase Initial      Distance 1550 feet     Walk Time 6 minutes     # of Rest Breaks 0     MPH 2.93     METS 3.22     RPE 12     VO2 Peak 11.27     Symptoms No     Resting HR 72 bpm     Resting BP 112/60     Resting Oxygen Saturation  96 %     Exercise Oxygen Saturation  during 6 min walk 94 %     Max Ex. HR 84 bpm     Max Ex. BP 130/60     2 Minute Post BP 112/60              Oxygen Initial Assessment:   Oxygen Re-Evaluation:   Oxygen Discharge (Final Oxygen Re-Evaluation):   Initial Exercise Prescription:  Initial Exercise Prescription - 03/18/22 1400       Date of Initial Exercise RX and Referring Provider   Date 03/18/22    Referring Provider [name]    Expected Discharge Date 06/18/22      Treadmill   MPH 2    Grade 0    Minutes 17      Recumbant Elliptical   Level 1    RPM 60    Minutes 22      Prescription Details   Frequency (times per week) 3    Duration Progress to 30 minutes of continuous aerobic without signs/symptoms of physical distress      Intensity   THRR 40-80% of Max Heartrate 61-122    Ratings of Perceived Exertion 11-13      Resistance Training   Training Prescription Yes    Weight 4    Reps 10-15             Perform Capillary Blood Glucose checks as needed.  Exercise Prescription Changes:   Exercise Prescription Changes     Row Name 03/29/22 0900 04/12/22 0900           Response to Exercise   Blood Pressure (Admit) 120/58 124/62      Blood Pressure (Exercise) 138/60 160/60      Blood Pressure (Exit) 102/50 120/60      Heart Rate (Admit) 76 bpm 77 bpm      Heart Rate (Exercise) 125 bpm 120 bpm      Heart Rate (Exit) 83 bpm 82 bpm      Rating of Perceived Exertion (Exercise) 12 12      Duration Continue with 30 min of aerobic exercise without signs/symptoms of physical distress. Continue with 30 min of aerobic exercise without signs/symptoms  of physical distress.      Intensity THRR unchanged THRR unchanged         Progression   Progression -- Continue to progress workloads to maintain intensity without signs/symptoms of physical distress.        Resistance Training   Training Prescription Yes Yes      Weight 4 5      Reps 10-15 10-15      Time 10 Minutes 10 Minutes        Treadmill   MPH 3.6 3.6      Grade 2.5 3.5      Minutes 22 22      METs 5 5.49        Recumbant Elliptical   Level 2 4      RPM 68 83      Minutes 17 17      METs 5.5 5.2               Exercise Comments:   Exercise Goals and Review:   Exercise Goals     Row Name 03/18/22 1404 04/12/22 0953           Exercise Goals   Increase Physical Activity Yes Yes      Intervention Provide advice, education, support and counseling about physical activity/exercise needs.;Develop an individualized exercise prescription for aerobic and resistive training based on initial evaluation findings, risk stratification, comorbidities and participant's personal goals. Provide advice, education, support and counseling about physical activity/exercise needs.;Develop an individualized exercise prescription for aerobic and resistive training based on initial evaluation findings, risk stratification, comorbidities and participant's personal goals.      Expected Outcomes Short Term: Attend rehab on a regular basis to increase amount of physical activity.;Long Term: Add in home exercise to make exercise part of routine and to increase amount of physical activity.;Long Term: Exercising regularly at least 3-5 days a week. Short Term: Attend rehab on a regular basis to increase amount of physical activity.;Long Term: Add in home exercise to make exercise part of routine and to increase amount of physical activity.;Long Term: Exercising regularly at least 3-5 days a week.      Increase Strength and Stamina Yes Yes      Intervention Provide advice, education, support and counseling about physical activity/exercise needs.;Develop an individualized  exercise prescription for aerobic and resistive training based on initial evaluation findings, risk stratification, comorbidities and participant's personal goals. Provide advice, education, support and counseling about physical activity/exercise needs.;Develop an individualized exercise prescription for aerobic and resistive training based on initial evaluation findings, risk stratification, comorbidities and participant's personal goals.      Expected Outcomes Short Term: Increase workloads from initial exercise prescription for resistance, speed, and METs.;Short Term: Perform resistance training exercises routinely during rehab and add in resistance training at home;Long Term: Improve cardiorespiratory fitness, muscular endurance and strength as measured by increased METs and functional capacity (6MWT) Short Term: Increase workloads from initial exercise prescription for resistance, speed, and METs.;Short Term: Perform resistance training exercises routinely during rehab and add in resistance training at home;Long Term: Improve cardiorespiratory fitness, muscular endurance and strength as measured by increased METs and functional capacity (6MWT)      Able to understand and use rate of perceived exertion (RPE) scale Yes --      Intervention Provide education and explanation on how to use RPE scale Provide education and explanation on how to use RPE scale      Expected Outcomes Short Term: Able to use RPE  daily in rehab to express subjective intensity level;Long Term:  Able to use RPE to guide intensity level when exercising independently Short Term: Able to use RPE daily in rehab to express subjective intensity level;Long Term:  Able to use RPE to guide intensity level when exercising independently      Knowledge and understanding of Target Heart Rate Range (THRR) Yes Yes      Intervention Provide education and explanation of THRR including how the numbers were predicted and where they are located for  reference Provide education and explanation of THRR including how the numbers were predicted and where they are located for reference      Expected Outcomes Short Term: Able to state/look up THRR;Short Term: Able to use daily as guideline for intensity in rehab;Long Term: Able to use THRR to govern intensity when exercising independently Short Term: Able to state/look up THRR;Short Term: Able to use daily as guideline for intensity in rehab;Long Term: Able to use THRR to govern intensity when exercising independently      Able to check pulse independently Yes Yes      Intervention Provide education and demonstration on how to check pulse in carotid and radial arteries.;Review the importance of being able to check your own pulse for safety during independent exercise Provide education and demonstration on how to check pulse in carotid and radial arteries.;Review the importance of being able to check your own pulse for safety during independent exercise      Expected Outcomes Short Term: Able to explain why pulse checking is important during independent exercise;Long Term: Able to check pulse independently and accurately Short Term: Able to explain why pulse checking is important during independent exercise;Long Term: Able to check pulse independently and accurately      Understanding of Exercise Prescription Yes Yes      Intervention Provide education, explanation, and written materials on patient's individual exercise prescription Provide education, explanation, and written materials on patient's individual exercise prescription      Expected Outcomes Short Term: Able to explain program exercise prescription;Long Term: Able to explain home exercise prescription to exercise independently Short Term: Able to explain program exercise prescription;Long Term: Able to explain home exercise prescription to exercise independently               Exercise Goals Re-Evaluation :  Exercise Goals Re-Evaluation      [place] Name 04/12/22 0954             Exercise Goal Re-Evaluation   Exercise Goals Review Increase Physical Activity;Increase Strength and Stamina;Able to understand and use rate of perceived exertion (RPE) scale;Knowledge and understanding of Target Heart Rate Range (THRR);Able to check pulse independently;Understanding of Exercise Prescription       Comments Pt has completed 11 session of cardiac rehab. He is very motivated during class and is pushing himself each session. He is currently exercising at 5.749 METs on the treadmill. He did push himself too hard one day last week, staff lowered his workload during session due to HR, causing his HR to take longer to come down then normal. Will continue to monitor and progress as able.       Expected Outcomes Through exercise at rehab and home, the patient will meet thier stated goals                 Discharge Exercise Prescription (Final Exercise Prescription Changes):  Exercise Prescription Changes - 04/12/22 0900       Response to Exercise  Blood Pressure (Admit) 124/62    Blood Pressure (Exercise) 160/60    Blood Pressure (Exit) 120/60    Heart Rate (Admit) 77 bpm    Heart Rate (Exercise) 120 bpm    Heart Rate (Exit) 82 bpm    Rating of Perceived Exertion (Exercise) 12    Duration Continue with 30 min of aerobic exercise without signs/symptoms of physical distress.    Intensity THRR unchanged      Progression   Progression Continue to progress workloads to maintain intensity without signs/symptoms of physical distress.      Resistance Training   Training Prescription Yes    Weight 5    Reps 10-15    Time 10 Minutes      Treadmill   MPH 3.6    Grade 3.5    Minutes 22    METs 5.49      Recumbant Elliptical   Level 4    RPM 83    Minutes 17    METs 5.2             Nutrition:  Target Goals: Understanding of nutrition guidelines, daily intake of sodium '1500mg'$ , cholesterol '200mg'$ , calories 30% from fat and  7% or less from saturated fats, daily to have 5 or more servings of fruits and vegetables.  Biometrics:  Pre Biometrics - 03/18/22 1404       Pre Biometrics   Height '5\' 11"'$  (1.803 m)    Weight 95.8 kg    Waist Circumference 41 inches    Hip Circumference 39.5 inches    Waist to Hip Ratio 1.04 %    BMI (Calculated) 29.47    Triceps Skinfold 10 mm    % Body Fat 26.7 %    Grip Strength 44.4 kg    Flexibility 6.5 in    Single Leg Stand 40.48 seconds              Nutrition Therapy Plan and Nutrition Goals:  Nutrition Therapy & Goals - 03/18/22 1354       Personal Nutrition Goals   Comments Patient scored 15 on his diet assessment. He says he cooks all of his meals and follows a low sodium diet. Handout explained and provided regarding healthier choices. We offer 2 educational sessions on heart healthy nutiriton with handouts and assistance with RD referral if patient is interested.      Intervention Plan   Intervention Nutrition handout(s) given to patient.             Nutrition Assessments:  Nutrition Assessments - 03/18/22 1353       MEDFICTS Scores   Pre Score 15            MEDIFICTS Score Key: ?70 Need to make dietary changes  40-70 Heart Healthy Diet ? 40 Therapeutic Level Cholesterol Diet   Picture Your Plate Scores: [id number] Unhealthy dietary pattern with much room for improvement. 41-50 Dietary pattern unlikely to meet recommendations for good health and room for improvement. 51-60 More healthful dietary pattern, with some room for improvement.  >60 Healthy dietary pattern, although there may be some specific behaviors that could be improved.    Nutrition Goals Re-Evaluation:   Nutrition Goals Discharge (Final Nutrition Goals Re-Evaluation):   Psychosocial: Target Goals: Acknowledge presence or absence of significant depression and/or stress, maximize coping skills, provide positive support system. Participant is able to verbalize types and  ability to use techniques and skills needed for reducing stress and depression.  Initial Review & Psychosocial Screening:  Initial Psych Review & Screening - 03/18/22 1359       Initial Review   Current issues with Current Sleep Concerns;Current Psychotropic Meds      Family Dynamics   Good Support System? Yes      Barriers   Psychosocial barriers to participate in program There are no identifiable barriers or psychosocial needs.;The patient should benefit from training in stress management and relaxation.      Screening Interventions   Interventions Encouraged to exercise;Provide feedback about the scores to participant    Expected Outcomes Short Term goal: Identification and review with participant of any Quality of Life or Depression concerns found by scoring the questionnaire.             Quality of Life Scores:  Quality of Life - 03/18/22 1405       Quality of Life   Select Quality of Life      Quality of Life Scores   Health/Function Pre 11.97 %    Socioeconomic Pre 17.5 %    Psych/Spiritual Pre 16.93 %    Family Pre 13.8 %    GLOBAL Pre 14.4 %            Scores of 19 and below usually indicate a poorer quality of life in these areas.  A difference of  2-3 points is a clinically meaningful difference.  A difference of 2-3 points in the total score of the Quality of Life Index has been associated with significant improvement in overall quality of life, self-image, physical symptoms, and general health in studies assessing change in quality of life.  PHQ-9: Review Flowsheet       03/18/2022  Depression screen PHQ 2/9  Decreased Interest 0  Down, Depressed, Hopeless 0  PHQ - 2 Score 0  Altered sleeping 2  Tired, decreased energy 3  Change in appetite 1  Feeling bad or failure about yourself  0  Trouble concentrating 0  Moving slowly or fidgety/restless 0  Suicidal thoughts 0  PHQ-9 Score 6  Difficult doing work/chores Somewhat difficult    Interpretation of Total Score  Total Score Depression Severity:  1-4 = Minimal depression, 5-9 = Mild depression, 10-14 = Moderate depression, 15-19 = Moderately severe depression, 20-27 = Severe depression   Psychosocial Evaluation and Intervention:  Psychosocial Evaluation - 03/18/22 1400       Psychosocial Evaluation & Interventions   Interventions Stress management education;Relaxation education;Encouraged to exercise with the program and follow exercise prescription    Comments Patient has no psychosocial barriers identified to participate in the program. His PHQ-9 score was 6 due to sleep, lack of energy and overeating at times. He denies any depression or anxiety. He says he has had trouble sleeping since his son was born who is 43. He is a special needs adult child and has seizures and patient says this effects his sleep thinking his son may have a seizure during the night. He has clonzapam, valium and ambien prescribed for sleep and uses them only occasionally. They help but he fears he will not be able to hear his son if he needs him during the night. Patient says he and his ex-wife have responsibiltiy for his son but he is with him the majority of the time. He does have a paid caregiver that helps with his son's care. 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 He says he has good support  from his girl friend and his paid caregiver who has been helping him for many years. He is ready to start the program.    Expected Outcomes Patient will continue to have no psychosocial barriers identified.    Continue Psychosocial Services  No Follow up required             Psychosocial Re-Evaluation:  Psychosocial Re-Evaluation     [place] Name 04/05/22 1127             Psychosocial Re-Evaluation   Current issues with Current Psychotropic Meds;Current Sleep Concerns       Comments Patient is new to the  program. He has completed 7 sessions. He continues to have no psychosocial barriers identified. He enjoys the sessions and demonstrates an interest in improving his health. He continues to have clonzepam; diazepam and ambien for sleep but rarely takes. We will continue to monitor his progress.       Expected Outcomes Patient will continue to have no psychosocial barriers identified.       Interventions Stress management education;Encouraged to attend Cardiac Rehabilitation for the exercise;Relaxation education       Continue Psychosocial Services  No Follow up required                Psychosocial Discharge (Final Psychosocial Re-Evaluation):  Psychosocial Re-Evaluation - 04/05/22 1127       Psychosocial Re-Evaluation   Current issues with Current Psychotropic Meds;Current Sleep Concerns    Comments Patient is new to the program. He has completed 7 sessions. He continues to have no psychosocial barriers identified. He enjoys the sessions and demonstrates an interest in improving his health. He continues to have clonzepam; diazepam and ambien for sleep but rarely takes. We will continue to monitor his progress.    Expected Outcomes Patient will continue to have no psychosocial barriers identified.    Interventions Stress management education;Encouraged to attend Cardiac Rehabilitation for the exercise;Relaxation education    Continue Psychosocial Services  No Follow up required             Vocational Rehabilitation: Provide vocational rehab assistance to qualifying candidates.   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             Education: Education Goals: Education classes will be provided on a weekly basis,  covering required topics. Participant will state understanding/return demonstration of topics presented.  Learning Barriers/Preferences:  Learning Barriers/Preferences - 03/18/22 1356       Learning Barriers/Preferences   Learning Barriers None    Learning Preferences Written Material             Education Topics: Hypertension, Hypertension Reduction -Define heart disease and high blood pressure. Discus how high blood pressure affects the body and ways to reduce high blood pressure.   Exercise and Your Heart -Discuss why it is important to exercise, the FITT principles of exercise, normal and abnormal responses to exercise, and how to exercise safely.   Angina -Discuss definition of angina, causes of angina, treatment of angina, and how to decrease risk of having angina.   Cardiac Medications -Review what the following cardiac medications are used for, how they affect the body, and side effects that may occur when taking  the medications.  Medications include Aspirin, Beta blockers, calcium channel blockers, ACE Inhibitors, angiotensin receptor blockers, diuretics, digoxin, and antihyperlipidemics.   Congestive Heart Failure -Discuss the definition of CHF, how to live with CHF, the signs and symptoms of CHF, and how keep track of weight and sodium intake.   Heart Disease and Intimacy -Discus the effect sexual activity has on the heart, how changes occur during intimacy as we age, and safety during sexual activity. Flowsheet Row CARDIAC REHAB PHASE II EXERCISE from 04/07/2022 in [facility]  Date 03/31/22  Educator DF  Instruction Review Code 2- Demonstrated Understanding       Smoking Cessation / COPD -Discuss different methods to quit smoking, the health benefits of quitting smoking, and the definition of COPD. Flowsheet Row CARDIAC REHAB PHASE II EXERCISE from 04/07/2022 in [facility]  Date 04/07/22  Educator DF   Instruction Review Code 2- Demonstrated Understanding       Nutrition I: Fats -Discuss the types of cholesterol, what cholesterol does to the heart, and how cholesterol levels can be controlled.   Nutrition II: Labels -Discuss the different components of food labels and how to read food label   Heart Parts/Heart Disease and PAD -Discuss the anatomy of the heart, the pathway of blood circulation through the heart, and these are affected by heart disease.   Stress I: Signs and Symptoms -Discuss the causes of stress, how stress may lead to anxiety and depression, and ways to limit stress.   Stress II: Relaxation -Discuss different types of relaxation techniques to limit stress.   Warning Signs of Stroke / TIA -Discuss definition of a stroke, what the signs and symptoms are of a stroke, and how to identify when someone is having stroke.   Knowledge Questionnaire Score:  Knowledge Questionnaire Score - 03/18/22 1356       Knowledge Questionnaire Score   Pre Score 21/24             Core Components/Risk Factors/Patient Goals at Admission:  Personal Goals and Risk Factors at Admission - 03/18/22 1357       Core Components/Risk Factors/Patient Goals on Admission    Weight Management Weight Maintenance    Improve shortness of breath with ADL's Yes    Intervention Provide education, individualized exercise plan and daily activity instruction to help decrease symptoms of SOB with activities of daily living.    Expected Outcomes Short Term: Improve cardiorespiratory fitness to achieve a reduction of symptoms when performing ADLs;Long Term: Be able to perform more ADLs without symptoms or delay the onset of symptoms    Hypertension Yes    Intervention Provide education on lifestyle modifcations including regular physical activity/exercise, weight management, moderate sodium restriction and increased consumption of fresh fruit, vegetables, and low fat dairy, alcohol moderation,  and smoking cessation.;Monitor prescription use compliance.    Expected Outcomes Short Term: Continued assessment and intervention until BP is < 140/60m HG in hypertensive participants. < 130/837mHG in hypertensive participants with diabetes, heart failure or chronic kidney disease.;Long Term: Maintenance of blood pressure at goal levels.    Lipids Yes    Intervention Provide education and support for participant on nutrition & aerobic/resistive exercise along with prescribed medications to achieve LDL '70mg'$ , HDL >'40mg'$ .    Expected Outcomes Short Term: Participant states understanding of desired cholesterol values and is compliant with medications prescribed. Participant is following exercise prescription and nutrition guidelines.    Personal Goal Other Yes    Personal Goal  Patient wants to get healthier; learn how to exercise; to be able to continue to do the activities he is doing; and improve his SOB.    Intervention Patient will attend CR 3 days/week with exercise and education.    Expected Outcomes Patient will complete the program meeting both personal and program goals.             Core Components/Risk Factors/Patient Goals Review:   Goals and Risk Factor Review     Row Name 04/05/22 1131             Core Components/Risk Factors/Patient Goals Review   Personal Goals Review Weight Management/Obesity;Hypertension;Lipids;Other       Review Patient was referred to CR with DES. He has multiple risk factors for CAD and is participating in the program for risk modification. He has completed 7 sessions. He is doing well in the program. His blood pressure has been labile. We will continue to monitor. His personal goals for the program are to get healthier; learn how to exercise; to be able to continue to do the activities he is doing and improve his SOB. We will continue to monitor his progress as he works towards meeting these goals.       Expected Outcomes Patient will complete the  program meeting both personal and program goals.                Core Components/Risk Factors/Patient Goals at Discharge (Final Review):   Goals and Risk Factor Review - 04/05/22 1131       Core Components/Risk Factors/Patient Goals Review   Personal Goals Review Weight Management/Obesity;Hypertension;Lipids;Other    Review Patient was referred to CR with DES. He has multiple risk factors for CAD and is participating in the program for risk modification. He has completed 7 sessions. He is doing well in the program. His blood pressure has been labile. We will continue to monitor. His personal goals for the program are to get healthier; learn how to exercise; to be able to continue to do the activities he is doing and improve his SOB. We will continue to monitor his progress as he works towards meeting these goals.    Expected Outcomes Patient will complete the program meeting both personal and program goals.             ITP Comments:   Comments: ITP REVIEW Pt is making expected progress toward Cardiac Rehab goals after completing 12 sessions. Recommend continued exercise, life style modification, education, and increased stamina and strength.

## 2022-04-14 NOTE — Progress Notes (Signed)
Daily Session Note  Patient Details  Name: Roger Powers MRN: [id number] Date of Birth: 11-Feb-1956 Referring Provider:   Flowsheet Row CARDIAC REHAB PHASE II ORIENTATION from 03/18/2022 in [place]  Referring Provider [name]       Encounter Date: 04/14/2022  Check In:  Session Check In - 04/14/22 0815       Check-In   Supervising physician immediately available to respond to emergencies [facility] MD immediately available    Physician(s) Dr Harl Bowie    Location [facility]    Staff Present Daphyne Hassell Done, RN, BSN;Elonna Mcfarlane Mel Almond, BS, Exercise Physiologist    Virtual Visit No    Medication changes reported     No    Fall or balance concerns reported    No    Tobacco Cessation No Change    Warm-up and Cool-down Performed as group-led instruction    Resistance Training Performed Yes    VAD Patient? No    PAD/SET Patient? No      Pain Assessment   Currently in Pain? No/denies    Pain Score 0-No pain    Multiple Pain Sites No             Capillary Blood Glucose: No results found for this or any previous visit (from the past 24 hour(s)).    Social History   Tobacco Use  Smoking Status Former   Types: Cigarettes   Quit date: 11/18/1979   Years since quitting: 42.4  Smokeless Tobacco Former    Goals Met:  Independence with exercise equipment Exercise tolerated well No report of concerns or symptoms today Strength training completed today  Goals Unmet:  Not Applicable  Comments: check out 0915   [name] is Medical Director for [place]

## 2022-04-16 ENCOUNTER — Encounter (HOSPITAL_COMMUNITY)
Admission: RE | Admit: 2022-04-16 | Discharge: 2022-04-16 | Disposition: A | Discharge status: Home / Self Care | Payer: Medicare HMO | Source: Ambulatory Visit | Attending: Internal Medicine | Admitting: Internal Medicine

## 2022-04-16 DIAGNOSIS — Z955 Presence of coronary angioplasty implant and graft: Secondary | ICD-10-CM | POA: Insufficient documentation

## 2022-04-16 NOTE — Progress Notes (Signed)
Daily Session Note  Patient Details  Name: Roger Powers MRN: [id number] Date of Birth: 05/25/1955 Referring Provider:   Flowsheet Row CARDIAC REHAB PHASE II ORIENTATION from 03/18/2022 in [place]  Referring Provider [name]       Encounter Date: 04/16/2022  Check In:  Session Check In - 04/16/22 0807       Check-In   Supervising physician immediately available to respond to emergencies [facility] MD immediately available    Physician(s) Dr Harl Bowie    Location [facility]    Staff Present Gianny Killman Hassell Done, RN, BSN;Dalton Sherrie George, MS, ACSM-CEP    Virtual Visit No    Medication changes reported     No    Fall or balance concerns reported    No    Tobacco Cessation No Change    Warm-up and Cool-down Performed as group-led instruction    Resistance Training Performed Yes    VAD Patient? No    PAD/SET Patient? No      Pain Assessment   Currently in Pain? No/denies    Pain Score 0-No pain    Multiple Pain Sites No             Capillary Blood Glucose: No results found for this or any previous visit (from the past 24 hour(s)).    Social History   Tobacco Use  Smoking Status Former   Types: Cigarettes   Quit date: 11/18/1979   Years since quitting: 42.4  Smokeless Tobacco Former    Goals Met:  Independence with exercise equipment Exercise tolerated well No report of concerns or symptoms today Strength training completed today  Goals Unmet:  Not Applicable  Comments: Checkout at [place].   [name] is Medical Director for [facility]

## 2022-04-19 ENCOUNTER — Encounter (HOSPITAL_COMMUNITY): Payer: Medicare HMO

## 2022-04-21 ENCOUNTER — Encounter (HOSPITAL_COMMUNITY): Payer: Medicare HMO

## 2022-04-23 ENCOUNTER — Encounter (HOSPITAL_COMMUNITY): Payer: Medicare HMO

## 2022-04-26 ENCOUNTER — Encounter (HOSPITAL_COMMUNITY)
Admission: RE | Admit: 2022-04-26 | Discharge: 2022-04-26 | Disposition: A | Discharge status: Home / Self Care | Payer: Medicare HMO | Source: Ambulatory Visit | Attending: Internal Medicine | Admitting: Internal Medicine

## 2022-04-26 VITALS — Wt 209.4 lb

## 2022-04-26 DIAGNOSIS — Z955 Presence of coronary angioplasty implant and graft: Secondary | ICD-10-CM | POA: Diagnosis not present

## 2022-04-26 NOTE — Progress Notes (Signed)
Daily Session Note  Patient Details  Name: Roger Powers MRN: [id number] Date of Birth: 30-Jun-1955 Referring Provider:   Flowsheet Row CARDIAC REHAB PHASE II ORIENTATION from 03/18/2022 in [place]  Referring Provider [name]       Encounter Date: 04/26/2022  Check In:  Session Check In - 04/26/22 0815       Check-In   Supervising physician immediately available to respond to emergencies [facility] MD immediately available    Physician(s) [name]    Location [facility]    Staff Present [name] MHA, MS, ACSM-CEP;Madelyn Flavors, RN, BSN;Heather Mel Almond, BS, Exercise Physiologist    Virtual Visit No    Medication changes reported     No    Fall or balance concerns reported    No    Tobacco Cessation No Change    Warm-up and Cool-down Performed as group-led instruction    Resistance Training Performed Yes    VAD Patient? No    PAD/SET Patient? No      Pain Assessment   Currently in Pain? No/denies    Pain Score 0-No pain    Multiple Pain Sites No             Capillary Blood Glucose: No results found for this or any previous visit (from the past 24 hour(s)).    Social History   Tobacco Use  Smoking Status Former   Types: Cigarettes   Quit date: 11/18/1979   Years since quitting: 42.4  Smokeless Tobacco Former    Goals Met:  Independence with exercise equipment Exercise tolerated well No report of concerns or symptoms today Strength training completed today  Goals Unmet:  Not Applicable  Comments: checkout time is 0915   [name] is Market researcher for [place]

## 2022-04-28 ENCOUNTER — Encounter (HOSPITAL_COMMUNITY)
Admission: RE | Admit: 2022-04-28 | Discharge: 2022-04-28 | Disposition: A | Discharge status: Home / Self Care | Payer: Medicare HMO | Source: Ambulatory Visit | Attending: Internal Medicine | Admitting: Internal Medicine

## 2022-04-28 DIAGNOSIS — Z955 Presence of coronary angioplasty implant and graft: Secondary | ICD-10-CM

## 2022-04-28 NOTE — Progress Notes (Signed)
Daily Session Note  Patient Details  Name: Roger Powers MRN: [id number] Date of Birth: 1955/11/18 Referring Provider:   Flowsheet Row CARDIAC REHAB PHASE II ORIENTATION from 03/18/2022 in [place]  Referring Provider [name]       Encounter Date: 04/28/2022  Check In:  Session Check In - 04/28/22 0815       Check-In   Supervising physician immediately available to respond to emergencies [facility] MD immediately available    Physician(s) [name]    Location [facility]    Staff Present [name] MHA, MS, ACSM-CEP;[name], BS, Exercise Physiologist;Hillary Troutman BSN, RN    Virtual Visit No    Medication changes reported     No    Fall or balance concerns reported    No    Tobacco Cessation No Change    Warm-up and Cool-down Performed as group-led instruction    Resistance Training Performed Yes    VAD Patient? No    PAD/SET Patient? No      Pain Assessment   Currently in Pain? No/denies    Pain Score 0-No pain    Multiple Pain Sites No             Capillary Blood Glucose: No results found for this or any previous visit (from the past 24 hour(s)).    Social History   Tobacco Use  Smoking Status Former   Types: Cigarettes   Quit date: 11/18/1979   Years since quitting: 42.4  Smokeless Tobacco Former    Goals Met:  Independence with exercise equipment Exercise tolerated well No report of concerns or symptoms today Strength training completed today  Goals Unmet:  Not Applicable  Comments: check out 0915   [name] is Medical Director for [place]

## 2022-04-30 ENCOUNTER — Encounter (HOSPITAL_COMMUNITY)
Admission: RE | Admit: 2022-04-30 | Discharge: 2022-04-30 | Disposition: A | Discharge status: Home / Self Care | Payer: Medicare HMO | Source: Ambulatory Visit | Attending: Internal Medicine | Admitting: Internal Medicine

## 2022-04-30 DIAGNOSIS — Z955 Presence of coronary angioplasty implant and graft: Secondary | ICD-10-CM

## 2022-04-30 NOTE — Progress Notes (Signed)
Daily Session Note  Patient Details  Name: Roger Powers MRN: [id number] Date of Birth: 28-Oct-1955 Referring Provider:   Flowsheet Row CARDIAC REHAB PHASE II ORIENTATION from 03/18/2022 in [place]  Referring Provider [name]       Encounter Date: 04/30/2022  Check In:  Session Check In - 04/30/22 0815       Check-In   Supervising physician immediately available to respond to emergencies [facility] MD immediately available    Physician(s) [name]    Location [facility]    Staff Present [name] MHA, MS, ACSM-CEP;[name], BS, Exercise Physiologist    Virtual Visit No    Medication changes reported     No    Fall or balance concerns reported    No    Tobacco Cessation No Change    Warm-up and Cool-down Performed as group-led instruction    Resistance Training Performed Yes    VAD Patient? No    PAD/SET Patient? No      Pain Assessment   Currently in Pain? No/denies    Pain Score 0-No pain    Multiple Pain Sites No             Capillary Blood Glucose: No results found for this or any previous visit (from the past 24 hour(s)).    Social History   Tobacco Use  Smoking Status Former   Types: Cigarettes   Quit date: 11/18/1979   Years since quitting: 42.4  Smokeless Tobacco Former    Goals Met:  Independence with exercise equipment Exercise tolerated well No report of concerns or symptoms today Strength training completed today  Goals Unmet:  Not Applicable  Comments: checkout time is 0915   [name] is Market researcher for [place]

## 2022-05-03 ENCOUNTER — Encounter (HOSPITAL_COMMUNITY)
Admission: RE | Admit: 2022-05-03 | Discharge: 2022-05-03 | Disposition: A | Discharge status: Home / Self Care | Payer: Medicare HMO | Source: Ambulatory Visit | Attending: Internal Medicine | Admitting: Internal Medicine

## 2022-05-03 DIAGNOSIS — Z955 Presence of coronary angioplasty implant and graft: Secondary | ICD-10-CM | POA: Diagnosis not present

## 2022-05-03 NOTE — Progress Notes (Signed)
Daily Session Note  Patient Details  Name: Roger Powers MRN: [id number] Date of Birth: [birth year] Referring Provider:   Flowsheet Row CARDIAC REHAB PHASE II ORIENTATION from 03/18/2022 in [place]  Referring Provider [name]       Encounter Date: 05/03/2022  Check In:  Session Check In - 05/03/22 0815       Check-In   Supervising physician immediately available to respond to emergencies [facility] MD immediately available    Physician(s) [name]    Location [facility]    Staff Present [name] MHA, MS, ACSM-CEP;[name], BS, Exercise Physiologist;Daphyne Hassell Done, RN, BSN    Virtual Visit No    Medication changes reported     No    Fall or balance concerns reported    No    Tobacco Cessation No Change    Warm-up and Cool-down Performed as group-led instruction    Resistance Training Performed Yes    VAD Patient? No    PAD/SET Patient? No      Pain Assessment   Currently in Pain? No/denies    Pain Score 0-No pain    Multiple Pain Sites No             Capillary Blood Glucose: No results found for this or any previous visit (from the past 24 hour(s)).    Social History   Tobacco Use  Smoking Status Former   Types: Cigarettes   Quit date: 11/18/1979   Years since quitting: 42.4  Smokeless Tobacco Former    Goals Met:  Independence with exercise equipment Exercise tolerated well No report of concerns or symptoms today Strength training completed today  Goals Unmet:  Not Applicable  Comments: checkout time is 0915   [name] is Market researcher for [place]

## 2022-05-05 ENCOUNTER — Encounter (HOSPITAL_COMMUNITY)
Admission: RE | Admit: 2022-05-05 | Discharge: 2022-05-05 | Disposition: A | Discharge status: Home / Self Care | Payer: Medicare HMO | Source: Ambulatory Visit | Attending: Internal Medicine | Admitting: Internal Medicine

## 2022-05-05 DIAGNOSIS — Z955 Presence of coronary angioplasty implant and graft: Secondary | ICD-10-CM

## 2022-05-05 NOTE — Progress Notes (Signed)
Daily Session Note  Patient Details  Name: DSHAUN DEETS MRN: [id number] Date of Birth: 1955-09-01 Referring Provider:   Flowsheet Row CARDIAC REHAB PHASE II ORIENTATION from 03/18/2022 in [place]  Referring Provider [name]       Encounter Date: 05/05/2022  Check In:  Session Check In - 05/05/22 0809       Check-In   Supervising physician immediately available to respond to emergencies [facility] MD immediately available    Physician(s) Dr. Gasper Sells    Location [facility]    Staff Present [name], BS, Exercise Physiologist;Dalton Sherrie George, MS, ACSM-CEP    Virtual Visit No    Medication changes reported     No    Fall or balance concerns reported    No    Tobacco Cessation No Change    Warm-up and Cool-down Performed as group-led instruction    Resistance Training Performed Yes    VAD Patient? No    PAD/SET Patient? No      Pain Assessment   Currently in Pain? No/denies    Pain Score 0-No pain    Multiple Pain Sites No             Capillary Blood Glucose: No results found for this or any previous visit (from the past 24 hour(s)).    Social History   Tobacco Use  Smoking Status Former   Types: Cigarettes   Quit date: 11/18/1979   Years since quitting: 42.4  Smokeless Tobacco Former    Goals Met:  Independence with exercise equipment Exercise tolerated well No report of concerns or symptoms today Strength training completed today  Goals Unmet:  Not Applicable  Comments: Check out 0915   [name] is Medical Director for [place]

## 2022-05-07 ENCOUNTER — Encounter (HOSPITAL_COMMUNITY)
Admission: RE | Admit: 2022-05-07 | Discharge: 2022-05-07 | Disposition: A | Discharge status: Home / Self Care | Payer: Medicare HMO | Source: Ambulatory Visit | Attending: Internal Medicine | Admitting: Internal Medicine

## 2022-05-07 DIAGNOSIS — Z955 Presence of coronary angioplasty implant and graft: Secondary | ICD-10-CM

## 2022-05-07 NOTE — Progress Notes (Signed)
Daily Session Note  Patient Details  Name: Roger Powers MRN: [id number] Date of Birth: 13-Jul-1955 Referring Provider:   Flowsheet Row CARDIAC REHAB PHASE II ORIENTATION from 03/18/2022 in [place]  Referring Provider [name]       Encounter Date: 05/07/2022  Check In:  Session Check In - 05/07/22 0810       Check-In   Supervising physician immediately available to respond to emergencies [facility] MD immediately available    Physician(s) Dr. Harl Bowie    Location [facility]    Staff Present [name], BS, Exercise Physiologist;Daphyne Hassell Done, RN, Jennye Moccasin, RN, BSN    Virtual Visit No    Medication changes reported     No    Fall or balance concerns reported    No    Tobacco Cessation No Change    Warm-up and Cool-down Performed as group-led instruction    Resistance Training Performed Yes    VAD Patient? No    PAD/SET Patient? No      Pain Assessment   Currently in Pain? No/denies    Pain Score 0-No pain    Multiple Pain Sites No             Capillary Blood Glucose: No results found for this or any previous visit (from the past 24 hour(s)).    Social History   Tobacco Use  Smoking Status Former   Types: Cigarettes   Quit date: 11/18/1979   Years since quitting: 42.4  Smokeless Tobacco Former    Goals Met:  Independence with exercise equipment Exercise tolerated well No report of concerns or symptoms today Strength training completed today  Goals Unmet:  Not Applicable  Comments: Check out 0915   [name] is Medical Director for [place]

## 2022-05-10 ENCOUNTER — Other Ambulatory Visit: Payer: Self-pay | Admitting: *Deleted

## 2022-05-10 ENCOUNTER — Encounter (HOSPITAL_COMMUNITY)
Admission: RE | Admit: 2022-05-10 | Discharge: 2022-05-10 | Disposition: A | Discharge status: Home / Self Care | Payer: Medicare HMO | Source: Ambulatory Visit | Attending: Internal Medicine | Admitting: Internal Medicine

## 2022-05-10 VITALS — Wt 207.0 lb

## 2022-05-10 DIAGNOSIS — Z955 Presence of coronary angioplasty implant and graft: Secondary | ICD-10-CM | POA: Diagnosis not present

## 2022-05-10 MED ORDER — CLOPIDOGREL BISULFATE 75 MG PO TABS: 75.0000 mg | ORAL_TABLET | Freq: Every day | ORAL | 3 refills | Status: DC

## 2022-05-10 NOTE — Progress Notes (Signed)
Daily Session Note  Patient Details  Name: Roger Powers MRN: [id number] Date of Birth: 1955/06/18 Referring Provider:   Flowsheet Row CARDIAC REHAB PHASE II ORIENTATION from 03/18/2022 in [place]  Referring Provider [name]       Encounter Date: 05/10/2022  Check In:  Session Check In - 05/10/22 0815       Check-In   Supervising physician immediately available to respond to emergencies [facility] MD immediately available    Physician(s) Dortha Schwalbe    Location [facility]    Staff Present [name], BS, Exercise Physiologist;Daphyne Hassell Done, RN, BSN    Virtual Visit No    Medication changes reported     No    Fall or balance concerns reported    No    Tobacco Cessation No Change    Warm-up and Cool-down Performed as group-led instruction    Resistance Training Performed Yes    VAD Patient? No    PAD/SET Patient? No      Pain Assessment   Currently in Pain? No/denies    Pain Score 0-No pain    Multiple Pain Sites No             Capillary Blood Glucose: No results found for this or any previous visit (from the past 24 hour(s)).    Social History   Tobacco Use  Smoking Status Former   Types: Cigarettes   Quit date: 11/18/1979   Years since quitting: 42.5  Smokeless Tobacco Former    Goals Met:  Independence with exercise equipment Exercise tolerated well No report of concerns or symptoms today Strength training completed today  Goals Unmet:  Not Applicable  Comments: check out 0915   [name] is Medical Director for [place]

## 2022-05-11 ENCOUNTER — Other Ambulatory Visit: Payer: Self-pay | Admitting: *Deleted

## 2022-05-11 DIAGNOSIS — Z955 Presence of coronary angioplasty implant and graft: Secondary | ICD-10-CM

## 2022-05-11 DIAGNOSIS — E785 Hyperlipidemia, unspecified: Secondary | ICD-10-CM

## 2022-05-11 DIAGNOSIS — I2510 Atherosclerotic heart disease of native coronary artery without angina pectoris: Secondary | ICD-10-CM

## 2022-05-11 DIAGNOSIS — Z79899 Other long term (current) drug therapy: Secondary | ICD-10-CM

## 2022-05-11 DIAGNOSIS — E782 Mixed hyperlipidemia: Secondary | ICD-10-CM

## 2022-05-11 NOTE — Progress Notes (Signed)
[facility] on the phone to request this pts lab orders be placed for them to draw on the pt today, as ordered by Nicholes Rough PA-C at last OV.  Orders to be placed is lipids and LFTs.   Orders placed and released to [facility].  They were able to visualize orders and will draw on the pt.

## 2022-05-12 ENCOUNTER — Encounter (HOSPITAL_COMMUNITY)
Admission: RE | Admit: 2022-05-12 | Discharge: 2022-05-12 | Disposition: A | Discharge status: Home / Self Care | Payer: Medicare HMO | Source: Ambulatory Visit | Attending: Internal Medicine | Admitting: Internal Medicine

## 2022-05-12 DIAGNOSIS — Z955 Presence of coronary angioplasty implant and graft: Secondary | ICD-10-CM

## 2022-05-12 DIAGNOSIS — E782 Mixed hyperlipidemia: Secondary | ICD-10-CM | POA: Diagnosis not present

## 2022-05-12 DIAGNOSIS — I2510 Atherosclerotic heart disease of native coronary artery without angina pectoris: Secondary | ICD-10-CM | POA: Diagnosis not present

## 2022-05-12 DIAGNOSIS — Z79899 Other long term (current) drug therapy: Secondary | ICD-10-CM | POA: Diagnosis not present

## 2022-05-12 DIAGNOSIS — E785 Hyperlipidemia, unspecified: Secondary | ICD-10-CM | POA: Diagnosis not present

## 2022-05-12 NOTE — Progress Notes (Signed)
Cardiac Individual Treatment Plan  Patient Details  Name: Roger Powers MRN: [id number] Date of Birth: 01-10-1956 Referring Provider:   Flowsheet Row CARDIAC REHAB PHASE II ORIENTATION from 03/18/2022 in [place]  Referring Provider [name]       Initial Encounter Date:  Flowsheet Row CARDIAC REHAB PHASE II ORIENTATION from 03/18/2022 in [place]  Date 03/18/22       Visit Diagnosis: S/P drug eluting coronary stent placement  Patient's Home Medications on Admission:  Current Outpatient Medications:    aspirin 81 MG tablet, Take 81 mg by mouth daily., Disp: , Rfl:    atorvastatin (LIPITOR) 80 MG tablet, Take 1 tablet (80 mg total) by mouth daily., Disp: 90 tablet, Rfl: 3   clobetasol (TEMOVATE) 0.05 % external solution, Apply 1 application  topically daily as needed (psoriasis)., Disp: , Rfl:    clonazePAM (KLONOPIN) 1 MG tablet, Take 1 mg by mouth at bedtime as needed for anxiety., Disp: , Rfl:    clopidogrel (PLAVIX) 75 MG tablet, Take 1 tablet (75 mg total) by mouth daily with breakfast., Disp: 90 tablet, Rfl: 3   diazepam (VALIUM) 10 MG tablet, Take 10 mg by mouth every 8 (eight) hours as needed for anxiety., Disp: , Rfl:    ezetimibe (ZETIA) 10 MG tablet, Take 1 tablet (10 mg total) by mouth daily., Disp: 90 tablet, Rfl: 3   hydrochlorothiazide (MICROZIDE) 12.5 MG capsule, Take 1 capsule (12.5 mg total) by mouth daily., Disp: 90 capsule, Rfl: 3   lisinopril (ZESTRIL) 40 MG tablet, Take 1 tablet (40 mg total) by mouth daily., Disp: 90 tablet, Rfl: 1   Multiple Vitamins-Minerals (MULTIVITAMIN MEN 50+ PO), Take 1 tablet by mouth daily., Disp: , Rfl:    nitroGLYCERIN (NITROSTAT) 0.4 MG SL tablet, Place 1 tablet (0.4 mg total) under the tongue every 5 (five) minutes as needed for chest pain., Disp: 100 tablet, Rfl: 3   OVER THE COUNTER MEDICATION, Take 1 tablet by mouth daily. Dynamic Brain Support, Disp: , Rfl:    OVER THE COUNTER  MEDICATION, Take 1 tablet by mouth daily. Immune Support Multivitamin, Disp: , Rfl:    pantoprazole (PROTONIX) 40 MG tablet, Take 1 tablet (40 mg total) by mouth daily., Disp: 90 tablet, Rfl: 3   zolpidem (AMBIEN) 10 MG tablet, Take 10 mg by mouth at bedtime as needed for sleep. , Disp: , Rfl:   Past Medical History: Past Medical History:  Diagnosis Date   CAD (coronary artery disease)    02/12/22: LHC DES 2.50x16 to midLAD, residual dz tx medically   Cataract    RIGHT eye only   GERD (gastroesophageal reflux disease)    hx of- with certain foods- on meds   Hypercholesteremia    on meds   Hypertension    on meds   Psoriasis     Tobacco Use: Social History   Tobacco Use  Smoking Status Former   Types: Cigarettes   Quit date: 11/18/1979   Years since quitting: 42.5  Smokeless Tobacco Former    Labs: Insurance account manager       Latest Ref Rng & Units 09/07/2016 09/29/2016  Labs for ITP Cardiac and Pulmonary Rehab  Cholestrol <200 mg/dL 222  134   LDL (calc) <100 mg/dL 135  71   HDL-C >40 mg/dL 32  26   Trlycerides <150 mg/dL 274  185     Capillary Blood Glucose: No results found for: "GLUCAP"   Exercise Target Goals: Exercise  Program Goal: Individual exercise prescription set using results from initial 6 min walk test and THRR while considering  patient's activity barriers and safety.   Exercise Prescription Goal: Starting with aerobic activity 30 plus minutes a day, 3 days per week for initial exercise prescription. Provide home exercise prescription and guidelines that participant acknowledges understanding prior to discharge.  Activity Barriers & Risk Stratification:  Activity Barriers & Cardiac Risk Stratification - 03/18/22 1256       Activity Barriers & Cardiac Risk Stratification   Activity Barriers Shortness of Breath    Cardiac Risk Stratification High             6 Minute Walk:  6 Minute Walk     Row Name 03/18/22 1401         6 Minute Walk    Phase Initial     Distance 1550 feet     Walk Time 6 minutes     # of Rest Breaks 0     MPH 2.93     METS 3.22     RPE 12     VO2 Peak 11.27     Symptoms No     Resting HR 72 bpm     Resting BP 112/60     Resting Oxygen Saturation  96 %     Exercise Oxygen Saturation  during 6 min walk 94 %     Max Ex. HR 84 bpm     Max Ex. BP 130/60     2 Minute Post BP 112/60              Oxygen Initial Assessment:   Oxygen Re-Evaluation:   Oxygen Discharge (Final Oxygen Re-Evaluation):   Initial Exercise Prescription:  Initial Exercise Prescription - 03/18/22 1400       Date of Initial Exercise RX and Referring Provider   Date 03/18/22    Referring Provider [name]    Expected Discharge Date 06/18/22      Treadmill   MPH 2    Grade 0    Minutes 17      Recumbant Elliptical   Level 1    RPM 60    Minutes 22      Prescription Details   Frequency (times per week) 3    Duration Progress to 30 minutes of continuous aerobic without signs/symptoms of physical distress      Intensity   THRR 40-80% of Max Heartrate 61-122    Ratings of Perceived Exertion 11-13      Resistance Training   Training Prescription Yes    Weight 4    Reps 10-15             Perform Capillary Blood Glucose checks as needed.  Exercise Prescription Changes:   Exercise Prescription Changes     Row Name 03/29/22 0900 04/12/22 0900 04/26/22 1000 05/10/22 1200       Response to Exercise   Blood Pressure (Admit) 120/58 124/62 124/62 140/72    Blood Pressure (Exercise) 138/60 160/60 136/60 158/74    Blood Pressure (Exit) 102/50 120/60 120/52 122/60    Heart Rate (Admit) 76 bpm 77 bpm 71 bpm 67 bpm    Heart Rate (Exercise) 125 bpm 120 bpm 104 bpm 129 bpm    Heart Rate (Exit) 83 bpm 82 bpm 80 bpm 83 bpm    Rating of Perceived Exertion (Exercise) 12 12 12 12     Duration Continue with 30 min of aerobic exercise without signs/symptoms of physical distress.  Continue with 30 min of  aerobic exercise without signs/symptoms of physical distress. Continue with 30 min of aerobic exercise without signs/symptoms of physical distress. Continue with 30 min of aerobic exercise without signs/symptoms of physical distress.    Intensity THRR unchanged THRR unchanged THRR unchanged THRR unchanged      Progression   Progression -- Continue to progress workloads to maintain intensity without signs/symptoms of physical distress. Continue to progress workloads to maintain intensity without signs/symptoms of physical distress. Continue to progress workloads to maintain intensity without signs/symptoms of physical distress.      Resistance Training   Training Prescription Yes Yes Yes Yes    Weight 4 5 5 5     Reps 10-15 10-15 10-15 10-15    Time 10 Minutes 10 Minutes 10 Minutes 10 Minutes      Treadmill   MPH 3.6 3.6 3.4 3.5    Grade 2.5 3.5 0 2.5    Minutes 22 22 22 22     METs 5 5.49 3.6 4.89      Recumbant Elliptical   Level 2 4 3 5     RPM 68 83 68 85    Minutes 17 17 17 17     METs 5.5 5.2 3.8 5.8             Exercise Comments:   Exercise Goals and Review:   Exercise Goals     Row Name 03/18/22 1404 04/12/22 0953 05/10/22 1242         Exercise Goals   Increase Physical Activity Yes Yes Yes     Intervention Provide advice, education, support and counseling about physical activity/exercise needs.;Develop an individualized exercise prescription for aerobic and resistive training based on initial evaluation findings, risk stratification, comorbidities and participant's personal goals. Provide advice, education, support and counseling about physical activity/exercise needs.;Develop an individualized exercise prescription for aerobic and resistive training based on initial evaluation findings, risk stratification, comorbidities and participant's personal goals. Provide advice, education, support and counseling about physical activity/exercise needs.;Develop an individualized  exercise prescription for aerobic and resistive training based on initial evaluation findings, risk stratification, comorbidities and participant's personal goals.     Expected Outcomes Short Term: Attend rehab on a regular basis to increase amount of physical activity.;Long Term: Add in home exercise to make exercise part of routine and to increase amount of physical activity.;Long Term: Exercising regularly at least 3-5 days a week. Short Term: Attend rehab on a regular basis to increase amount of physical activity.;Long Term: Add in home exercise to make exercise part of routine and to increase amount of physical activity.;Long Term: Exercising regularly at least 3-5 days a week. Short Term: Attend rehab on a regular basis to increase amount of physical activity.;Long Term: Add in home exercise to make exercise part of routine and to increase amount of physical activity.;Long Term: Exercising regularly at least 3-5 days a week.     Increase Strength and Stamina Yes Yes Yes     Intervention Provide advice, education, support and counseling about physical activity/exercise needs.;Develop an individualized exercise prescription for aerobic and resistive training based on initial evaluation findings, risk stratification, comorbidities and participant's personal goals. Provide advice, education, support and counseling about physical activity/exercise needs.;Develop an individualized exercise prescription for aerobic and resistive training based on initial evaluation findings, risk stratification, comorbidities and participant's personal goals. Provide advice, education, support and counseling about physical activity/exercise needs.;Develop an individualized exercise prescription for aerobic and resistive training based on initial evaluation findings, risk stratification, comorbidities  and participant's personal goals.     Expected Outcomes Short Term: Increase workloads from initial exercise prescription for  resistance, speed, and METs.;Short Term: Perform resistance training exercises routinely during rehab and add in resistance training at home;Long Term: Improve cardiorespiratory fitness, muscular endurance and strength as measured by increased METs and functional capacity (6MWT) Short Term: Increase workloads from initial exercise prescription for resistance, speed, and METs.;Short Term: Perform resistance training exercises routinely during rehab and add in resistance training at home;Long Term: Improve cardiorespiratory fitness, muscular endurance and strength as measured by increased METs and functional capacity (6MWT) Short Term: Increase workloads from initial exercise prescription for resistance, speed, and METs.;Short Term: Perform resistance training exercises routinely during rehab and add in resistance training at home;Long Term: Improve cardiorespiratory fitness, muscular endurance and strength as measured by increased METs and functional capacity (6MWT)     Able to understand and use rate of perceived exertion (RPE) scale Yes -- Yes     Intervention Provide education and explanation on how to use RPE scale Provide education and explanation on how to use RPE scale Provide education and explanation on how to use RPE scale     Expected Outcomes Short Term: Able to use RPE daily in rehab to express subjective intensity level;Long Term:  Able to use RPE to guide intensity level when exercising independently Short Term: Able to use RPE daily in rehab to express subjective intensity level;Long Term:  Able to use RPE to guide intensity level when exercising independently Short Term: Able to use RPE daily in rehab to express subjective intensity level;Long Term:  Able to use RPE to guide intensity level when exercising independently     Knowledge and understanding of Target Heart Rate Range (THRR) Yes Yes Yes     Intervention Provide education and explanation of THRR including how the numbers were predicted  and where they are located for reference Provide education and explanation of THRR including how the numbers were predicted and where they are located for reference Provide education and explanation of THRR including how the numbers were predicted and where they are located for reference     Expected Outcomes Short Term: Able to state/look up THRR;Short Term: Able to use daily as guideline for intensity in rehab;Long Term: Able to use THRR to govern intensity when exercising independently Short Term: Able to state/look up THRR;Short Term: Able to use daily as guideline for intensity in rehab;Long Term: Able to use THRR to govern intensity when exercising independently Short Term: Able to state/look up THRR;Short Term: Able to use daily as guideline for intensity in rehab;Long Term: Able to use THRR to govern intensity when exercising independently     Able to check pulse independently Yes Yes Yes     Intervention Provide education and demonstration on how to check pulse in carotid and radial arteries.;Review the importance of being able to check your own pulse for safety during independent exercise Provide education and demonstration on how to check pulse in carotid and radial arteries.;Review the importance of being able to check your own pulse for safety during independent exercise Provide education and demonstration on how to check pulse in carotid and radial arteries.;Review the importance of being able to check your own pulse for safety during independent exercise     Expected Outcomes Short Term: Able to explain why pulse checking is important during independent exercise;Long Term: Able to check pulse independently and accurately Short Term: Able to explain why pulse checking is important during  independent exercise;Long Term: Able to check pulse independently and accurately Short Term: Able to explain why pulse checking is important during independent exercise;Long Term: Able to check pulse independently  and accurately     Understanding of Exercise Prescription Yes Yes Yes     Intervention Provide education, explanation, and written materials on patient's individual exercise prescription Provide education, explanation, and written materials on patient's individual exercise prescription Provide education, explanation, and written materials on patient's individual exercise prescription     Expected Outcomes Short Term: Able to explain program exercise prescription;Long Term: Able to explain home exercise prescription to exercise independently Short Term: Able to explain program exercise prescription;Long Term: Able to explain home exercise prescription to exercise independently Short Term: Able to explain program exercise prescription;Long Term: Able to explain home exercise prescription to exercise independently              Exercise Goals Re-Evaluation :  Exercise Goals Re-Evaluation     Row Name 04/12/22 0954 05/10/22 1243           Exercise Goal Re-Evaluation   Exercise Goals Review Increase Physical Activity;Increase Strength and Stamina;Able to understand and use rate of perceived exertion (RPE) scale;Knowledge and understanding of Target Heart Rate Range (THRR);Able to check pulse independently;Understanding of Exercise Prescription Increase Physical Activity;Increase Strength and Stamina;Able to understand and use rate of perceived exertion (RPE) scale;Knowledge and understanding of Target Heart Rate Range (THRR);Able to check pulse independently;Understanding of Exercise Prescription      Comments Pt has completed 11 session of cardiac rehab. He is very motivated during class and is pushing himself each session. He is currently exercising at 5.749 METs on the treadmill. He did push himself too hard one day last week, staff lowered his workload during session due to HR, causing his HR to take longer to come down then normal. Will continue to monitor and progress as able. Pt has completed  20 sessions of cardiac rehab. He continues to be motivated during class. He is pushing himself each session and increasing his workload. He is curretnly exercising at 5.8 METs on the ellp. Will continue to monitor and progress as able.      Expected Outcomes Through exercise at rehab and home, the patient will meet thier stated goals Through exercise at rehab and home, the patient will meet thier stated goals                Discharge Exercise Prescription (Final Exercise Prescription Changes):  Exercise Prescription Changes - 05/10/22 1200       Response to Exercise   Blood Pressure (Admit) 140/72    Blood Pressure (Exercise) 158/74    Blood Pressure (Exit) 122/60    Heart Rate (Admit) 67 bpm    Heart Rate (Exercise) 129 bpm    Heart Rate (Exit) 83 bpm    Rating of Perceived Exertion (Exercise) 12    Duration Continue with 30 min of aerobic exercise without signs/symptoms of physical distress.    Intensity THRR unchanged      Progression   Progression Continue to progress workloads to maintain intensity without signs/symptoms of physical distress.      Resistance Training   Training Prescription Yes    Weight 5    Reps 10-15    Time 10 Minutes      Treadmill   MPH 3.5    Grade 2.5    Minutes 22    METs 4.89      Recumbant Elliptical   Level  5    RPM 85    Minutes 17    METs 5.8             Nutrition:  Target Goals: Understanding of nutrition guidelines, daily intake of sodium 1500mg , cholesterol 200mg , calories 30% from fat and 7% or less from saturated fats, daily to have 5 or more servings of fruits and vegetables.  Biometrics:  Pre Biometrics - 03/18/22 1404       Pre Biometrics   Height 5\' 11"  (1.803 m)    Weight 95.8 kg    Waist Circumference 41 inches    Hip Circumference 39.5 inches    Waist to Hip Ratio 1.04 %    BMI (Calculated) 29.47    Triceps Skinfold 10 mm    % Body Fat 26.7 %    Grip Strength 44.4 kg    Flexibility 6.5 in    Single  Leg Stand 40.48 seconds              Nutrition Therapy Plan and Nutrition Goals:  Nutrition Therapy & Goals - 03/18/22 1354       Personal Nutrition Goals   Comments Patient scored 15 on his diet assessment. He says he cooks all of his meals and follows a low sodium diet. Handout explained and provided regarding healthier choices. We offer 2 educational sessions on heart healthy nutiriton with handouts and assistance with RD referral if patient is interested.      Intervention Plan   Intervention Nutrition handout(s) given to patient.             Nutrition Assessments:  Nutrition Assessments - 03/18/22 1353       MEDFICTS Scores   Pre Score 15            MEDIFICTS Score Key: ?70 Need to make dietary changes  40-70 Heart Healthy Diet ? 40 Therapeutic Level Cholesterol Diet   Picture Your Plate Scores: [id number] Unhealthy dietary pattern with much room for improvement. 41-50 Dietary pattern unlikely to meet recommendations for good health and room for improvement. 51-60 More healthful dietary pattern, with some room for improvement.  >60 Healthy dietary pattern, although there may be some specific behaviors that could be improved.    Nutrition Goals Re-Evaluation:   Nutrition Goals Discharge (Final Nutrition Goals Re-Evaluation):   Psychosocial: Target Goals: Acknowledge presence or absence of significant depression and/or stress, maximize coping skills, provide positive support system. Participant is able to verbalize types and ability to use techniques and skills needed for reducing stress and depression.  Initial Review & Psychosocial Screening:  Initial Psych Review & Screening - 03/18/22 1359       Initial Review   Current issues with Current Sleep Concerns;Current Psychotropic Meds      Family Dynamics   Good Support System? Yes      Barriers   Psychosocial barriers to participate in program There are no identifiable barriers or psychosocial  needs.;The patient should benefit from training in stress management and relaxation.      Screening Interventions   Interventions Encouraged to exercise;Provide feedback about the scores to participant    Expected Outcomes Short Term goal: Identification and review with participant of any Quality of Life or Depression concerns found by scoring the questionnaire.             Quality of Life Scores:  Quality of Life - 03/18/22 1405       Quality of Life   Select Quality of Life  Quality of Life Scores   Health/Function Pre 11.97 %    Socioeconomic Pre 17.5 %    Psych/Spiritual Pre 16.93 %    Family Pre 13.8 %    GLOBAL Pre 14.4 %            Scores of 19 and below usually indicate a poorer quality of life in these areas.  A difference of  2-3 points is a clinically meaningful difference.  A difference of 2-3 points in the total score of the Quality of Life Index has been associated with significant improvement in overall quality of life, self-image, physical symptoms, and general health in studies assessing change in quality of life.  PHQ-9: Review Flowsheet       03/18/2022  Depression screen PHQ 2/9  Decreased Interest 0  Down, Depressed, Hopeless 0  PHQ - 2 Score 0  Altered sleeping 2  Tired, decreased energy 3  Change in appetite 1  Feeling bad or failure about yourself  0  Trouble concentrating 0  Moving slowly or fidgety/restless 0  Suicidal thoughts 0  PHQ-9 Score 6  Difficult doing work/chores Somewhat difficult   Interpretation of Total Score  Total Score Depression Severity:  1-4 = Minimal depression, 5-9 = Mild depression, 10-14 = Moderate depression, 15-19 = Moderately severe depression, 20-27 = Severe depression   Psychosocial Evaluation and Intervention:  Psychosocial Evaluation - 03/18/22 1400       Psychosocial Evaluation & Interventions   Interventions Stress management education;Relaxation education;Encouraged to exercise with the program  and follow exercise prescription    Comments Patient has no psychosocial barriers identified to participate in the program. His PHQ-9 score was 6 due to sleep, lack of energy and overeating at times. He denies any depression or anxiety. He says he has had trouble sleeping since his son was born who is 68. He is a special needs adult child and has seizures and patient says this effects his sleep thinking his son may have a seizure during the night. He has clonzapam, valium and ambien prescribed for sleep and uses them only occasionally. They help but he fears he will not be able to hear his son if he needs him during the night. Patient says he and his ex-wife have responsibiltiy for his son but he is with him the majority of the time. He does have a paid caregiver that helps with his son's care. 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 He says he has good support from his girl friend and his paid caregiver who has been helping him for many years. He is ready to start the program.    Expected Outcomes Patient will continue to have no psychosocial barriers identified.    Continue Psychosocial Services  No Follow up required             Psychosocial Re-Evaluation:  Psychosocial Re-Evaluation     [place] Name 04/05/22 1127 05/03/22 1055           Psychosocial Re-Evaluation   Current issues with Current Psychotropic Meds;Current Sleep Concerns Current Psychotropic Meds;Current Sleep Concerns      Comments Patient is new to the program. He has completed 7 sessions. He continues to have no psychosocial barriers identified. He enjoys the sessions and demonstrates an interest in improving his health. He continues to have clonzepam; diazepam and ambien for sleep but rarely takes. We will continue to monitor his progress.  Patient has completed 16 sessions. He continues to have no psychosocial barriers identified. He continues to  enjoy the sessions and demonstrates an interest in improving his health. He continues to have clonzepam; diazepam and ambien for sleep but rarely takes. We will continue to monitor his progress.      Expected Outcomes Patient will continue to have no psychosocial barriers identified. Patient will continue to have no psychosocial barriers identified.      Interventions Stress management education;Encouraged to attend Cardiac Rehabilitation for the exercise;Relaxation education Stress management education;Encouraged to attend Cardiac Rehabilitation for the exercise;Relaxation education      Continue Psychosocial Services  No Follow up required No Follow up required               Psychosocial Discharge (Final Psychosocial Re-Evaluation):  Psychosocial Re-Evaluation - 05/03/22 1055       Psychosocial Re-Evaluation   Current issues with Current Psychotropic Meds;Current Sleep Concerns    Comments Patient has completed 16 sessions. He continues to have no psychosocial barriers identified. He continues to enjoy the sessions and demonstrates an interest in improving his health. He continues to have clonzepam; diazepam and ambien for sleep but rarely takes. We will continue to monitor his progress.    Expected Outcomes Patient will continue to have no psychosocial barriers identified.    Interventions Stress management education;Encouraged to attend Cardiac Rehabilitation for the exercise;Relaxation education    Continue Psychosocial Services  No Follow up required             Vocational Rehabilitation: Provide vocational rehab assistance to qualifying candidates.   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              Education: Education Goals: Education classes will be provided on a weekly basis, covering required topics. Participant will state understanding/return demonstration of topics presented.  Learning Barriers/Preferences:  Learning Barriers/Preferences - 03/18/22 1356       Learning Barriers/Preferences   Learning Barriers None    Learning Preferences Written Material             Education Topics: Hypertension, Hypertension Reduction -Define heart disease and high blood pressure. Discus how high blood pressure affects the body and ways to reduce high blood pressure.   Exercise and Your Heart -Discuss why it is important to exercise, the FITT principles of exercise, normal and abnormal responses to exercise, and how to exercise safely.   Angina -Discuss definition of angina, causes of angina, treatment of angina, and how to decrease risk of having angina.   Cardiac Medications -Review what the following cardiac medications are used for, how they affect the body, and side effects that may occur when taking the medications.  Medications include Aspirin, Beta blockers, calcium channel blockers, ACE Inhibitors, angiotensin receptor blockers, diuretics, digoxin, and antihyperlipidemics.   Congestive Heart Failure -Discuss the definition of CHF, how to live with CHF, the signs and symptoms of CHF, and how keep track of weight and sodium intake.   Heart Disease and Intimacy -Discus the effect sexual activity has on the heart, how changes occur during intimacy as we age, and safety during sexual activity. Flowsheet Row CARDIAC REHAB PHASE II EXERCISE from 05/05/2022 in [place]  Date 03/31/22  Educator DF  Instruction Review Code 2- Demonstrated Understanding       Smoking Cessation / COPD -Discuss different methods to quit smoking, the health benefits of quitting smoking, and the definition of COPD. Flowsheet Row CARDIAC REHAB PHASE II EXERCISE  from 05/05/2022 in [place]  Date 04/07/22  Educator DF  Instruction Review Code 2- Demonstrated Understanding       Nutrition I: Fats -Discuss the types of cholesterol, what cholesterol does to the heart, and how cholesterol levels can be controlled. Flowsheet Row CARDIAC REHAB PHASE II EXERCISE from 05/05/2022 in [place]  Date 04/14/22  Educator HB  Instruction Review Code 2- Demonstrated Understanding       Nutrition II: Labels -Discuss the different components of food labels and how to read food label   Heart Parts/Heart Disease and PAD -Discuss the anatomy of the heart, the pathway of blood circulation through the heart, and these are affected by heart disease. Flowsheet Row CARDIAC REHAB PHASE II EXERCISE from 05/05/2022 in [place]  Date 04/28/22  Educator HB  Instruction Review Code 1- Verbalizes Understanding       Stress I: Signs and Symptoms -Discuss the causes of stress, how stress may lead to anxiety and depression, and ways to limit stress. Flowsheet Row CARDIAC REHAB PHASE II EXERCISE from 05/05/2022 in [place]  Date 05/05/22  Educator DF  Instruction Review Code 1- Verbalizes Understanding       Stress II: Relaxation -Discuss different types of relaxation techniques to limit stress.   Warning Signs of Stroke / TIA -Discuss definition of a stroke, what the signs and symptoms are of a stroke, and how to identify when someone is having stroke.   Knowledge Questionnaire Score:  Knowledge Questionnaire Score - 03/18/22 1356       Knowledge Questionnaire Score   Pre Score 21/24             Core Components/Risk Factors/Patient Goals at Admission:  Personal Goals and Risk Factors at Admission - 03/18/22 1357       Core Components/Risk Factors/Patient Goals on Admission    Weight Management Weight Maintenance    Improve shortness of breath with  ADL's Yes    Intervention Provide education, individualized exercise plan and daily activity instruction to help decrease symptoms of SOB with activities of daily living.    Expected Outcomes Short Term: Improve cardiorespiratory fitness to achieve a reduction of symptoms when performing ADLs;Long Term: Be able to perform more ADLs without symptoms or delay the onset of symptoms    Hypertension Yes    Intervention Provide education on lifestyle modifcations including regular physical activity/exercise, weight management, moderate sodium restriction and increased consumption of fresh fruit, vegetables, and low fat dairy, alcohol moderation, and smoking cessation.;Monitor prescription use compliance.    Expected Outcomes Short Term: Continued assessment and intervention until BP is < 140/27mm HG in hypertensive participants. < 130/54mm HG in hypertensive participants with diabetes, heart failure or chronic kidney disease.;Long Term: Maintenance of blood pressure at goal levels.    Lipids Yes    Intervention Provide education and support for participant on nutrition & aerobic/resistive exercise along with prescribed medications to achieve LDL 70mg , HDL >40mg .    Expected Outcomes Short Term: Participant states understanding of desired cholesterol values and is compliant with medications prescribed. Participant is following exercise prescription and nutrition guidelines.    Personal Goal Other Yes    Personal Goal  Patient wants to get healthier; learn how to exercise; to be able to continue to do the activities he is doing; and improve his SOB.    Intervention Patient will attend CR 3 days/week with exercise and education.    Expected Outcomes Patient will complete the program meeting both personal and program goals.             Core Components/Risk Factors/Patient Goals Review:   Goals and Risk Factor Review     Row Name 04/05/22 1131 05/03/22 1056           Core Components/Risk  Factors/Patient Goals Review   Personal Goals Review Weight Management/Obesity;Hypertension;Lipids;Other Weight Management/Obesity;Hypertension;Lipids;Other      Review Patient was referred to CR with DES. He has multiple risk factors for CAD and is participating in the program for risk modification. He has completed 7 sessions. He is doing well in the program. His blood pressure has been labile. We will continue to monitor. His personal goals for the program are to get healthier; learn how to exercise; to be able to continue to do the activities he is doing and improve his SOB. We will continue to monitor his progress as he works towards meeting these goals. Patient has completed 16 sessions. His current weight is 209.6 lbs. He continues to do well in the program with consistent attendance and progressions. His blood pressure has improved with some elvated readings. We will continue to monitor. His personal goals for the program are to get healthier; learn how to exercise; to be able to continue to do the activities he is doing and improve his SOB. We will continue to monitor his progress as he works towards meeting these goals.      Expected Outcomes Patient will complete the program meeting both personal and program goals. Patient will complete the program meeting both personal and program goals.               Core Components/Risk Factors/Patient Goals at Discharge (Final Review):   Goals and Risk Factor Review - 05/03/22 1056       Core Components/Risk Factors/Patient Goals Review   Personal Goals Review Weight Management/Obesity;Hypertension;Lipids;Other    Review Patient has completed 16 sessions. His current weight is 209.6 lbs. He continues to do well in the program with consistent attendance and progressions. His blood pressure has improved with some elvated readings. We will continue to monitor. His personal goals for the program are to get healthier; learn how to exercise; to be able to  continue to do the activities he is doing and improve his SOB. We will continue to monitor his progress as he works towards meeting these goals.    Expected Outcomes Patient will complete the program meeting both personal and program goals.             ITP Comments:   Comments: ITP REVIEW Pt is making expected progress toward Cardiac Rehab goals after completing 20 sessions. Recommend continued exercise, life style modification, education, and increased stamina and strength.

## 2022-05-12 NOTE — Progress Notes (Signed)
Daily Session Note  Patient Details  Name: Roger Powers MRN: [id number] Date of Birth: 12-20-1955 Referring Provider:   Flowsheet Row CARDIAC REHAB PHASE II ORIENTATION from 03/18/2022 in [place]  Referring Provider [name]       Encounter Date: 05/12/2022  Check In:  Session Check In - 05/12/22 0815       Check-In   Supervising physician immediately available to respond to emergencies [facility] MD immediately available    Physician(s) Dortha Schwalbe    Location [facility]    Staff Present [name] MHA, MS, ACSM-CEP;[employer] BSN, RN;Heather Mel Almond, [place], Exercise Physiologist    Virtual Visit No    Medication changes reported     No    Fall or balance concerns reported    No    Tobacco Cessation No Change    Warm-up and Cool-down Performed as group-led instruction    Resistance Training Performed Yes    VAD Patient? No    PAD/SET Patient? No      Pain Assessment   Currently in Pain? No/denies    Pain Score 0-No pain    Multiple Pain Sites No             Capillary Blood Glucose: No results found for this or any previous visit (from the past 24 hour(s)).    Social History   Tobacco Use  Smoking Status Former   Types: Cigarettes   Quit date: 11/18/1979   Years since quitting: 42.5  Smokeless Tobacco Former    Goals Met:  Independence with exercise equipment Exercise tolerated well No report of concerns or symptoms today Strength training completed today  Goals Unmet:  Not Applicable  Comments: checkout time Is 0915   [name] is Medical Director for [facility]

## 2022-05-13 LAB — HEPATIC FUNCTION PANEL
AG Ratio: 2 (calc) (ref 1.0–2.5)
ALT: 33 U/L (ref 9–46)
AST: 27 U/L (ref 10–35)
Albumin: 4.5 g/dL (ref 3.6–5.1)
Alkaline phosphatase (APISO): 66 U/L (ref 35–144)
Bilirubin, Direct: 0 mg/dL (ref 0.0–0.2)
Globulin: 2.2 g/dL (calc) (ref 1.9–3.7)
Indirect Bilirubin: 0.2 mg/dL (calc) (ref 0.2–1.2)
Total Bilirubin: 0.2 mg/dL (ref 0.2–1.2)
Total Protein: 6.7 g/dL (ref 6.1–8.1)

## 2022-05-13 LAB — LIPID PANEL
Cholesterol: 107 mg/dL (ref <200)
HDL: 28 mg/dL — ABNORMAL LOW (ref >=40)
LDL Cholesterol (Calc): 41 mg/dL (calc)
Non-HDL Cholesterol (Calc): 79 mg/dL (calc) (ref <130)
Total CHOL/HDL Ratio: 3.8 (calc) (ref <5.0)
Triglycerides: 363 mg/dL — ABNORMAL HIGH (ref <150)

## 2022-05-14 ENCOUNTER — Encounter (HOSPITAL_COMMUNITY)
Admission: RE | Admit: 2022-05-14 | Discharge: 2022-05-14 | Disposition: A | Discharge status: Home / Self Care | Payer: Medicare HMO | Source: Ambulatory Visit | Attending: Internal Medicine | Admitting: Internal Medicine

## 2022-05-14 DIAGNOSIS — Z955 Presence of coronary angioplasty implant and graft: Secondary | ICD-10-CM

## 2022-05-14 NOTE — Progress Notes (Signed)
Daily Session Note  Patient Details  Name: Roger Powers MRN: [id number] Date of Birth: [birth year] Referring Provider:   Flowsheet Row CARDIAC REHAB PHASE II ORIENTATION from 03/18/2022 in [place]  Referring Provider [name]       Encounter Date: 05/14/2022  Check In:  Session Check In - 05/14/22 0802       Check-In   Supervising physician immediately available to respond to emergencies [facility] MD immediately available    Physician(s) Dr. Harrington Challenger    Location [facility]    Staff Present [name] MHA, MS, ACSM-CEP;Madelyn Flavors, RN, BSN    Virtual Visit No    Medication changes reported     No    Fall or balance concerns reported    No    Tobacco Cessation No Change    Warm-up and Cool-down Performed as group-led instruction    Resistance Training Performed Yes    VAD Patient? No    PAD/SET Patient? No      Pain Assessment   Currently in Pain? No/denies    Pain Score 0-No pain    Multiple Pain Sites No             Capillary Blood Glucose: No results found for this or any previous visit (from the past 24 hour(s)).    Social History   Tobacco Use  Smoking Status Former   Types: Cigarettes   Quit date: 11/18/1979   Years since quitting: 42.5  Smokeless Tobacco Former    Goals Met:  Independence with exercise equipment Exercise tolerated well No report of concerns or symptoms today Strength training completed today  Goals Unmet:  Not Applicable  Comments: checkout time is 0915   [name] is Market researcher for [place]

## 2022-05-17 ENCOUNTER — Encounter (HOSPITAL_COMMUNITY)
Admission: RE | Admit: 2022-05-17 | Discharge: 2022-05-17 | Disposition: A | Discharge status: Home / Self Care | Payer: Medicare HMO | Source: Ambulatory Visit | Attending: Internal Medicine | Admitting: Internal Medicine

## 2022-05-17 DIAGNOSIS — Z955 Presence of coronary angioplasty implant and graft: Secondary | ICD-10-CM | POA: Insufficient documentation

## 2022-05-17 NOTE — Progress Notes (Signed)
Daily Session Note  Patient Details  Name: Roger Powers MRN: [id number] Date of Birth: 02-14-1956 Referring Provider:   Flowsheet Row CARDIAC REHAB PHASE II ORIENTATION from 03/18/2022 in [place]  Referring Provider [name]       Encounter Date: 05/17/2022  Check In:  Session Check In - 05/17/22 0815       Check-In   Supervising physician immediately available to respond to emergencies [facility] MD immediately available    Physician(s) [name]    Location [facility]    Staff Present [name] MHA, MS, ACSM-CEP;Madelyn Flavors, RN, BSN;Heather Mel Almond, BS, Exercise Physiologist    Virtual Visit No    Medication changes reported     No    Fall or balance concerns reported    No    Tobacco Cessation No Change    Warm-up and Cool-down Performed as group-led instruction    Resistance Training Performed Yes    VAD Patient? No    PAD/SET Patient? No      Pain Assessment   Currently in Pain? No/denies    Pain Score 0-No pain    Multiple Pain Sites No             Capillary Blood Glucose: No results found for this or any previous visit (from the past 24 hour(s)).    Social History   Tobacco Use  Smoking Status Former   Types: Cigarettes   Quit date: 11/18/1979   Years since quitting: 42.5  Smokeless Tobacco Former    Goals Met:  Independence with exercise equipment Exercise tolerated well No report of concerns or symptoms today Strength training completed today  Goals Unmet:  Not Applicable  Comments: checkout time is 0915   [name] is Market researcher for [place]

## 2022-05-18 DIAGNOSIS — M25661 Stiffness of right knee, not elsewhere classified: Secondary | ICD-10-CM | POA: Diagnosis not present

## 2022-05-18 DIAGNOSIS — M25561 Pain in right knee: Secondary | ICD-10-CM | POA: Diagnosis not present

## 2022-05-18 DIAGNOSIS — R262 Difficulty in walking, not elsewhere classified: Secondary | ICD-10-CM | POA: Diagnosis not present

## 2022-05-19 ENCOUNTER — Encounter (HOSPITAL_COMMUNITY)
Admission: RE | Admit: 2022-05-19 | Discharge: 2022-05-19 | Disposition: A | Discharge status: Home / Self Care | Payer: Medicare HMO | Source: Ambulatory Visit | Attending: Internal Medicine | Admitting: Internal Medicine

## 2022-05-19 DIAGNOSIS — Z955 Presence of coronary angioplasty implant and graft: Secondary | ICD-10-CM | POA: Diagnosis not present

## 2022-05-19 NOTE — Progress Notes (Signed)
Daily Session Note  Patient Details  Name: Roger Powers MRN: [id number] Date of Birth: [birth year] Referring Provider:   Flowsheet Row CARDIAC REHAB PHASE II ORIENTATION from 03/18/2022 in [place]  Referring Provider [name]       Encounter Date: 05/19/2022  Check In:  Session Check In - 05/19/22 0815       Check-In   Supervising physician immediately available to respond to emergencies [facility] MD immediately available    Physician(s) [name]    Location [facility]    Staff Present [name] MHA, MS, ACSM-CEP;[employer] BSN, RN;Heather Mel Almond, [place], Exercise Physiologist    Virtual Visit No    Medication changes reported     No    Fall or balance concerns reported    No    Tobacco Cessation No Change    Warm-up and Cool-down Performed as group-led instruction    Resistance Training Performed Yes    VAD Patient? No    PAD/SET Patient? No      Pain Assessment   Currently in Pain? No/denies    Pain Score 0-No pain    Multiple Pain Sites No             Capillary Blood Glucose: No results found for this or any previous visit (from the past 24 hour(s)).    Social History   Tobacco Use  Smoking Status Former   Types: Cigarettes   Quit date: 11/18/1979   Years since quitting: 42.5  Smokeless Tobacco Former    Goals Met:  Independence with exercise equipment Exercise tolerated well No report of concerns or symptoms today Strength training completed today  Goals Unmet:  Not Applicable  Comments: checkout time is 0915   [name] is Market researcher for [place]

## 2022-05-21 ENCOUNTER — Encounter (HOSPITAL_COMMUNITY)
Admission: RE | Admit: 2022-05-21 | Discharge: 2022-05-21 | Disposition: A | Discharge status: Home / Self Care | Payer: Medicare HMO | Source: Ambulatory Visit | Attending: Internal Medicine | Admitting: Internal Medicine

## 2022-05-21 DIAGNOSIS — Z955 Presence of coronary angioplasty implant and graft: Secondary | ICD-10-CM | POA: Diagnosis not present

## 2022-05-21 NOTE — Progress Notes (Signed)
Daily Session Note  Patient Details  Name: Roger Powers MRN: [id number] Date of Birth: 25-Nov-1955 Referring Provider:   Flowsheet Row CARDIAC REHAB PHASE II ORIENTATION from 03/18/2022 in [facility] CARDIAC REHABILITATION  Referring Provider Dr. Eldridge Dace       Encounter Date: 05/21/2022  Check In:  Session Check In - 05/21/22 0815       Check-In   Supervising physician immediately available to respond to emergencies [facility] MD immediately available    Physician(s) Dr. Jenene Slicker    Location [facility]    Staff Present Cristopher Peru MHA, MS, ACSM-CEP;Ross Ludwig, BS, Exercise Physiologist;Harce Volden, RN;Debra Laural Benes, RN, Pleas Koch, RN, BSN    Virtual Visit No    Medication changes reported     No    Fall or balance concerns reported    No    Tobacco Cessation No Change    Warm-up and Cool-down Performed as group-led instruction    Resistance Training Performed Yes    VAD Patient? No    PAD/SET Patient? No      Pain Assessment   Currently in Pain? No/denies    Pain Score 0-No pain    Multiple Pain Sites No             Capillary Blood Glucose: No results found for this or any previous visit (from the past 24 hour(s)).    Social History   Tobacco Use  Smoking Status Former   Types: Cigarettes   Quit date: 11/18/1979   Years since quitting: 42.5  Smokeless Tobacco Former    Goals Met:  Independence with exercise equipment Exercise tolerated well No report of concerns or symptoms today Strength training completed today  Goals Unmet:  Not Applicable  Comments: check out @ 9:15am   Dr. Dina Rich is Medical Director for [facility] Card[facility]

## 2022-05-24 ENCOUNTER — Encounter (HOSPITAL_COMMUNITY)
Admission: RE | Admit: 2022-05-24 | Discharge: 2022-05-24 | Disposition: A | Discharge status: Home / Self Care | Payer: Medicare HMO | Source: Ambulatory Visit | Attending: Internal Medicine | Admitting: Internal Medicine

## 2022-05-24 VITALS — Wt 206.4 lb

## 2022-05-24 DIAGNOSIS — Z955 Presence of coronary angioplasty implant and graft: Secondary | ICD-10-CM | POA: Diagnosis not present

## 2022-05-24 NOTE — Progress Notes (Signed)
Daily Session Note  Patient Details  Name: NEFTALY CROUCHER MRN: [id number] Date of Birth: February 09, 1956 Referring Provider:   Flowsheet Row CARDIAC REHAB PHASE II ORIENTATION from 03/18/2022 in [facility] CARDIAC REHABILITATION  Referring Provider Dr. Eldridge Dace       Encounter Date: 05/24/2022  Check In:  Session Check In - 05/24/22 0815       Check-In   Supervising physician immediately available to respond to emergencies [facility] MD immediately available    Physician(s) Dr. Wyline Mood    Location [facility]    Staff Present Cristopher Peru MHA, MS, ACSM-CEP;Ross Ludwig, BS, Exercise Physiologist;Daphyne Daphine Deutscher, RN, BSN    Virtual Visit No    Medication changes reported     No    Fall or balance concerns reported    No    Tobacco Cessation No Change    Warm-up and Cool-down Performed as group-led instruction    Resistance Training Performed Yes    VAD Patient? No    PAD/SET Patient? No      Pain Assessment   Currently in Pain? No/denies    Pain Score 0-No pain    Multiple Pain Sites No             Capillary Blood Glucose: No results found for this or any previous visit (from the past 24 hour(s)).    Social History   Tobacco Use  Smoking Status Former   Types: Cigarettes   Quit date: 11/18/1979   Years since quitting: 42.5  Smokeless Tobacco Former    Goals Met:  Independence with exercise equipment Exercise tolerated well No report of concerns or symptoms today Strength training completed today  Goals Unmet:  Not Applicable  Comments: check out 915   Dr. Dina Rich is Medical Director for [facility] Card[facility]

## 2022-05-25 ENCOUNTER — Encounter: Payer: Self-pay | Admitting: Internal Medicine

## 2022-05-25 ENCOUNTER — Ambulatory Visit: Payer: Medicare HMO | Attending: Internal Medicine | Admitting: Internal Medicine

## 2022-05-25 VITALS — BP 126/72 | HR 54 | Ht 71.0 in | Wt 210.2 lb

## 2022-05-25 DIAGNOSIS — E782 Mixed hyperlipidemia: Secondary | ICD-10-CM

## 2022-05-25 DIAGNOSIS — E785 Hyperlipidemia, unspecified: Secondary | ICD-10-CM

## 2022-05-25 DIAGNOSIS — I10 Essential (primary) hypertension: Secondary | ICD-10-CM

## 2022-05-25 DIAGNOSIS — I2510 Atherosclerotic heart disease of native coronary artery without angina pectoris: Secondary | ICD-10-CM | POA: Diagnosis not present

## 2022-05-25 DIAGNOSIS — Z79899 Other long term (current) drug therapy: Secondary | ICD-10-CM | POA: Diagnosis not present

## 2022-05-25 MED ORDER — METOPROLOL SUCCINATE ER 25 MG PO TB24: 25.0000 mg | ORAL_TABLET | Freq: Every day | ORAL | 3 refills | Status: DC

## 2022-05-25 MED ORDER — NITROGLYCERIN 0.4 MG SL SUBL: 0.4000 mg | SUBLINGUAL_TABLET | SUBLINGUAL | 3 refills | Status: DC | PRN

## 2022-05-25 MED ORDER — ATORVASTATIN CALCIUM 80 MG PO TABS: 80.0000 mg | ORAL_TABLET | Freq: Every day | ORAL | 3 refills | Status: DC

## 2022-05-25 NOTE — Patient Instructions (Addendum)
Medication Instructions:  Start TOPROL XL 25 MG... TAKE 1/2 (12.5 MG) ONCE A DAY FOR A WEEK.  *If you need a refill on your cardiac medications before your next appointment, please call your pharmacy*   Lab Work: LIPO A, APO B TODAY  If you have labs (blood work) drawn today and your tests are completely normal, you will receive your results only by: MyChart Message (if you have MyChart) OR A paper copy in the mail If you have any lab test that is abnormal or we need to change your treatment, we will call you to review the results.   Testing/Procedures:    Follow-Up: At [facility], you and your health needs are our priority.  As part of our continuing mission to provide you with exceptional heart care, we have created designated Provider Care Teams.  These Care Teams include your primary Cardiologist (physician) and Advanced Practice Providers (APPs -  Physician Assistants and Nurse Practitioners) who all work together to provide you with the care you need, when you need it.  We recommend signing up for the patient portal called "MyChart".  Sign up information is provided on this After Visit Summary.  MyChart is used to connect with patients for Virtual Visits (Telemedicine).  Patients are able to view lab/test results, encounter notes, upcoming appointments, etc.  Non-urgent messages can be sent to your provider as well.   To learn more about what you can do with MyChart, go to [email or web address].    Your next appointment:    4 MONTHS   Provider:   Dietrich Pates, MD     Other Instructions

## 2022-05-25 NOTE — Progress Notes (Signed)
Cardiology Office Note   Date:  05/25/2022   ID:  Roger Powers, Roger Powers Sep 04, 1955, MRN [id number]  PCP:  Gareth Morgan, MD  Cardiologist:   Dietrich Pates, MD   Pt returns for f/u of CP and CAD      History of Present Illness: Roger Powers is a 67 y.o. male with a history of CAD   Ca score in 2018 was 339   Aorta 43 mm    Stress myoview  2021 without ischemia In Dec 2023  Coronary CTA showed signif stensosis in LAD   REcomm LHC which showed 75% LAD stenosis in mid LAD   Underwent PCI/DES to mid LAD    Plan for ASA / Plavix for 6 months   He was seen by Margaretha Glassing   Started cardiac rehab   The pt denies CP   Says his energy has picked up   Half way through cardiac rehab  Notices that his HR goes up higher then slow to go down Denies dizziness   breaghing is OK      Current Meds  Medication Sig   aspirin 81 MG tablet Take 81 mg by mouth daily.   atorvastatin (LIPITOR) 80 MG tablet Take 1 tablet (80 mg total) by mouth daily.   clobetasol (TEMOVATE) 0.05 % external solution Apply 1 application  topically daily as needed (psoriasis).   clonazePAM (KLONOPIN) 1 MG tablet Take 1 mg by mouth at bedtime as needed for anxiety.   clopidogrel (PLAVIX) 75 MG tablet Take 1 tablet (75 mg total) by mouth daily with breakfast.   diazepam (VALIUM) 10 MG tablet Take 10 mg by mouth every 8 (eight) hours as needed for anxiety.   ezetimibe (ZETIA) 10 MG tablet Take 1 tablet (10 mg total) by mouth daily.   hydrochlorothiazide (MICROZIDE) 12.5 MG capsule Take 1 capsule (12.5 mg total) by mouth daily.   lisinopril (ZESTRIL) 40 MG tablet Take 1 tablet (40 mg total) by mouth daily.   Multiple Vitamins-Minerals (MULTIVITAMIN MEN 50+ PO) Take 1 tablet by mouth daily.   nitroGLYCERIN (NITROSTAT) 0.4 MG SL tablet Place 1 tablet (0.4 mg total) under the tongue every 5 (five) minutes as needed for chest pain.   OVER THE COUNTER MEDICATION Take 1 tablet by mouth daily. Dynamic Brain Support   OVER THE COUNTER MEDICATION Take  1 tablet by mouth daily. Immune Support Multivitamin   pantoprazole (PROTONIX) 40 MG tablet Take 1 tablet (40 mg total) by mouth daily.   zolpidem (AMBIEN) 10 MG tablet Take 10 mg by mouth at bedtime as needed for sleep.      Allergies:   Patient has no known allergies.   Past Medical History:  Diagnosis Date   CAD (coronary artery disease)    02/12/22: LHC DES 2.50x16 to midLAD, residual dz tx medically   Cataract    RIGHT eye only   GERD (gastroesophageal reflux disease)    hx of- with certain foods- on meds   Hypercholesteremia    on meds   Hypertension    on meds   Psoriasis     Past Surgical History:  Procedure Laterality Date   CATARACT EXTRACTION W/ INTRAOCULAR LENS IMPLANT Right 2013   COLONOSCOPY  2004   patient cannot remember where/MD   CORONARY STENT INTERVENTION N/A 02/12/2022   Procedure: CORONARY STENT INTERVENTION;  Surgeon: Corky Crafts, MD;  Location: [facility] INVASIVE CV LAB;  Service: Cardiovascular;  Laterality: N/A;   LEFT HEART CATH AND CORONARY  ANGIOGRAPHY N/A 02/12/2022   Procedure: LEFT HEART CATH AND CORONARY ANGIOGRAPHY;  Surgeon: Corky Crafts, MD;  Location: [facility] INVASIVE CV LAB;  Service: Cardiovascular;  Laterality: N/A;   RETINAL DETACHMENT SURGERY Right 2012   WISDOM TOOTH EXTRACTION       Social History:  The patient  reports that he quit smoking about 42 years ago. His smoking use included cigarettes. He has quit using smokeless tobacco. He reports current alcohol use of about 2.0 standard drinks of alcohol per week. He reports that he does not use drugs.   Family History:  The patient's family history includes Breast cancer in his mother; Diabetes in his mother; Heart disease in his brother, brother, and father; Lung cancer in his mother.    ROS:  Please see the history of present illness. All other systems are reviewed and  Negative to the above problem except as noted.    PHYSICAL EXAM: VS:  BP 126/72   Pulse (!) 54   Ht 5'  11" (1.803 m)   Wt 210 lb 3.2 oz (95.3 kg)   SpO2 98%   BMI 29.32 kg/m   GEN: Overweight 67 yo  in no acute distress  HEENT: normal  Neck: JVP normal;  Cardiac: RRR; Gr II/Vi syst murmur  No LE  edema  Respiratory:  clear to auscultation  GI: soft, nontender, nondistended   No hepatomegaly    EKG:  EKG not done   Echo  Jan 2024  1. Left ventricular ejection fraction, by estimation, is 65 to 70%. The  left ventricle has normal function. The left ventricle has no regional  wall motion abnormalities. There is moderate asymmetric left ventricular  hypertrophy of the basal-septal  segment. Left ventricular diastolic parameters are indeterminate. Elevated  left atrial pressure. The average left ventricular global longitudinal  strain is -19.9 %. The global longitudinal strain is normal.   2. Right ventricular systolic function is normal. The right ventricular  size is normal.   3. The mitral valve is normal in structure. No evidence of mitral valve  regurgitation. No evidence of mitral stenosis.   4. The aortic valve is tricuspid. There is mild calcification of the  aortic valve. There is mild thickening of the aortic valve. Aortic valve  regurgitation is not visualized. No aortic stenosis is present.   5. The inferior vena cava is normal in size with greater than 50%  respiratory variability, suggesting right atrial pressure of 3 mmHg.    LHC   Dec 2024    Prox RCA lesion is 25% stenosed.   RPAV lesion is 50% stenosed.   Mid LAD-1 lesion is 25% stenosed.   Mid LAD-2 lesion is 40% stenosed.   Mid LAD-3 lesion is 75% stenosed.  A drug-eluting stent was successfully placed using a SYNERGY XD 2.50X16, postdilated to greater than 2.75 mm.   Post intervention, there is a 0% residual stenosis.   The left ventricular systolic function is normal.   LV end diastolic pressure is normal.  LVEDP 8 mmHg.   The left ventricular ejection fraction is 55-65% by visual estimate.   There is no  aortic valve stenosis.   There was tortuosity in the right subclavian making catheter manipulation somewhat difficult.  There was right radial spasm.   Successful PCI of the mid LAD.  Mild to moderate disease elsewhere in the coronary tree.  Continue aggressive preventive therapy along with dual antiplatelet therapy for at least 6 months.  After 6 months, would  consider clopidogrel monotherapy going forward given his diffuse atherosclerosis and calcification.  CT Dec 2024   Coronary calcium score is 847, which places the patient in the 85th percentile for age and sex matched control.   Coronary arteries: Normal coronary origins.  Right dominance.   Right Coronary Artery: Severe mixed atherosclerotic plaque in the proximal RCA, 70-99% stenosis. Mild mixed atherosclerotic plaque in the mid RCA with low attenuation plaque, 25-49% stenosis. Mild scattered plaque in the distal RCA, PDA and PL, 25-49% stenosis.   Left Main Coronary Artery: Minimal distal LM mixed atherosclerotic plaque, <25% stenosis.   Left Anterior Descending Coronary Artery: Mild mixed atherosclerotic plaque in the proximal LAD, 25-49% stenosis. Moderate mixed atherosclerotic plaque in the mid LAD at the level of the D1 bifurcation, 50-69% stenosis. Minimal ostial plaque in D1, <25% stenosis. Severe mixed atherosclerotic plaque in the mid LAD just distal to the D1 takeoff, 70-99% stenosis, and a serial severe stenosis 70-99% stenosis in the mid LAD. Distal vessel has mild scattered disease.   Ramus intermedius: Mild mixed atherosclerotic plaque in the mid RI, 25-49%.   Left Circumflex Artery: Mild mixed atherosclerotic plaque in the proximal LCx, 25-49%.   Aorta: Mild dilation, 42 x 40 x 39 mm at sinus of Valsalva, 40 mm at the mid ascending aorta (level of the PA bifurcation) measured double oblique.   Aortic Valve: No calcifications.   Other findings:   Normal pulmonary vein drainage into the left atrium.    Normal left atrial appendage without thrombus.   Mild dilation of main and branch pulmonary arteries   Severe basal septal hypertrophy, 16-17 mm.   [facility]-card[facility] findings.   IMPRESSION: 1. Severe CAD in the proximal RCA and mid LAD, 70-99% stenosis, CADRADS 4. CT FFR will be performed and reported separately.   2. Coronary calcium score is 847, which places the patient in the 85th percentile for age and sex matched control.   3. Normal coronary origins with right dominance.   4. Severe basal septal hypertrophy, 16-17 mm.   5. Mild dilation of main and branch pulmonary arteries, which may indicate increased pulmonary pressures.   6. Mild dilation of ascending aorta, 42 mm at sinus of Valsalva, 40 mm at mid ascending aorta.   Lipid Panel    Component Value Date/Time   CHOL 107 05/12/2022 0707   TRIG 363 (H) 05/12/2022 0707   HDL 28 (L) 05/12/2022 0707   CHOLHDL 3.8 05/12/2022 0707   VLDL 37 (H) 09/29/2016 0737   LDLCALC 41 05/12/2022 0707      Wt Readings from Last 3 Encounters:  05/25/22 210 lb 3.2 oz (95.3 kg)  05/24/22 206 lb 5.6 oz (93.6 kg)  05/10/22 207 lb 0.2 oz (93.9 kg)      ASSESSMENT AND PLAN:  1  CAD  Ca score 847  Pt s/p cath with intervention to LAD   Doing well clniically   Going through cardiac rehab    With HR increases with exercise (and BP a little high) will add Toprol XL   25 mg   Start at 1/2   INcrease to whole tablet   Follow   Send response in My Chart    2  HTN  Pt is on lisinopril 40  No HCTZ added    Add Toprol    Follow BP    4  Aortic dilation  Follow on CT   42 mm in Dec  4   HL   Recent lipids   LDL 41  HDL 28  Trig 363   Will check Lpa , Apo B and A1C     Follow up later this summer       Current medicines are reviewed at length with the patient today.  The patient does not have concerns regarding medicines.  Signed, Dietrich Pates, MD  05/25/2022 11:23 AM    [facility] [address] [place], [place], [place]  [address] Phone: [phone]; Fax: [phone]

## 2022-05-26 ENCOUNTER — Other Ambulatory Visit: Payer: Self-pay | Admitting: Internal Medicine

## 2022-05-26 ENCOUNTER — Encounter (HOSPITAL_COMMUNITY)
Admission: RE | Admit: 2022-05-26 | Discharge: 2022-05-26 | Disposition: A | Discharge status: Home / Self Care | Payer: Medicare HMO | Source: Ambulatory Visit | Attending: Internal Medicine | Admitting: Internal Medicine

## 2022-05-26 DIAGNOSIS — Z955 Presence of coronary angioplasty implant and graft: Secondary | ICD-10-CM

## 2022-05-26 LAB — APOLIPOPROTEIN B: Apolipoprotein B: 54 mg/dL (ref <90)

## 2022-05-26 LAB — LIPOPROTEIN A (LPA): Lipoprotein (a): 23.2 nmol/L (ref <75.0)

## 2022-05-26 NOTE — Progress Notes (Signed)
Daily Session Note  Patient Details  Name: Roger Powers MRN: [id number] Date of Birth: 04-01-1955 Referring Provider:   Flowsheet Row CARDIAC REHAB PHASE II ORIENTATION from 03/18/2022 in [facility] CARDIAC REHABILITATION  Referring Provider Dr. Eldridge Dace       Encounter Date: 05/26/2022  Check In:  Session Check In - 05/26/22 0815       Check-In   Supervising physician immediately available to respond to emergencies [facility] MD immediately available    Physician(s) Dr. Wyline Mood    Location [facility]    Staff Present Ross Ludwig, BS, Exercise Physiologist;Amilia Vandenbrink Laural Benes, RN, Pleas Koch, RN, BSN    Virtual Visit No    Medication changes reported     No    Fall or balance concerns reported    No    Tobacco Cessation No Change    Warm-up and Cool-down Performed as group-led instruction    Resistance Training Performed Yes    VAD Patient? No    PAD/SET Patient? No      Pain Assessment   Currently in Pain? No/denies    Pain Score 0-No pain    Multiple Pain Sites No             Capillary Blood Glucose: Results for orders placed or performed in visit on 05/25/22 (from the past 24 hour(s))  Apolipoprotein B     Status: None   Collection Time: 05/25/22 11:53 AM  Result Value Ref Range   Apolipoprotein B 54 <90 mg/dL   Narrative   Performed at:  [address] [facility] [address] [place], [place]  [id number] Lab Director: Jolene Schimke MD, Phone:  [phone]  Lipoprotein A (LPA)     Status: None   Collection Time: 05/25/22 11:53 AM  Result Value Ref Range   Lipoprotein (a) 23.2 <75.0 nmol/L   Narrative   Performed at:  [address] [place], [place]  [id number] Lab Director: Jolene Schimke MD, Phone:  [phone]      Social History   Tobacco Use  Smoking Status Former   Types: Cigarettes   Quit date: 11/18/1979   Years since quitting: 42.5  Smokeless Tobacco Former    Goals Met:  Independence with exercise  equipment Exercise tolerated well No report of concerns or symptoms today Strength training completed today  Goals Unmet:  Not Applicable  Comments: Check out 0915   Dr. Dina Rich is Medical Director for [facility] Cardiac Rehab

## 2022-05-28 ENCOUNTER — Encounter (HOSPITAL_COMMUNITY)
Admission: RE | Admit: 2022-05-28 | Discharge: 2022-05-28 | Disposition: A | Discharge status: Home / Self Care | Payer: Medicare HMO | Source: Ambulatory Visit | Attending: Internal Medicine | Admitting: Internal Medicine

## 2022-05-28 DIAGNOSIS — Z955 Presence of coronary angioplasty implant and graft: Secondary | ICD-10-CM

## 2022-05-28 NOTE — Progress Notes (Signed)
Daily Session Note  Patient Details  Name: GREGOREY FOSSE MRN: [id number] Date of Birth: 1955/08/22 Referring Provider:   Flowsheet Row CARDIAC REHAB PHASE II ORIENTATION from 03/18/2022 in [facility] CARDIAC REHABILITATION  Referring Provider Dr. Eldridge Dace       Encounter Date: 05/28/2022  Check In:  Session Check In - 05/28/22 0815       Check-In   Supervising physician immediately available to respond to emergencies [facility] MD immediately available    Physician(s) Dr. Wyline Mood    Location [facility]    Staff Present Rodena Medin, RN, Pleas Koch, RN, BSN;Heather Fredric Mare, BS, Exercise Physiologist    Virtual Visit No    Medication changes reported     No    Fall or balance concerns reported    No    Tobacco Cessation No Change    Warm-up and Cool-down Performed as group-led instruction    Resistance Training Performed Yes    VAD Patient? No    PAD/SET Patient? No      Pain Assessment   Currently in Pain? No/denies    Pain Score 0-No pain    Multiple Pain Sites No             Capillary Blood Glucose: No results found for this or any previous visit (from the past 24 hour(s)).    Social History   Tobacco Use  Smoking Status Former   Types: Cigarettes   Quit date: 11/18/1979   Years since quitting: 42.5  Smokeless Tobacco Former    Goals Met:  Independence with exercise equipment Exercise tolerated well No report of concerns or symptoms today Strength training completed today  Goals Unmet:  Not Applicable  Comments: Check out 915.   Dr. Dina Rich is Medical Director for [facility] Cardiac Rehab

## 2022-05-31 ENCOUNTER — Encounter (HOSPITAL_COMMUNITY)
Admission: RE | Admit: 2022-05-31 | Discharge: 2022-05-31 | Disposition: A | Discharge status: Home / Self Care | Payer: Medicare HMO | Source: Ambulatory Visit | Attending: Internal Medicine | Admitting: Internal Medicine

## 2022-05-31 DIAGNOSIS — Z955 Presence of coronary angioplasty implant and graft: Secondary | ICD-10-CM | POA: Diagnosis not present

## 2022-05-31 NOTE — Progress Notes (Signed)
Daily Session Note  Patient Details  Name: Roger Powers MRN: [id number] Date of Birth: 1955/10/31 Referring Provider:   Flowsheet Row CARDIAC REHAB PHASE II ORIENTATION from 03/18/2022 in [facility] CARDIAC REHABILITATION  Referring Provider Dr. Eldridge Dace       Encounter Date: 05/31/2022  Check In:  Session Check In - 05/31/22 0815       Check-In   Supervising physician immediately available to respond to emergencies [facility] MD immediately available    Physician(s) Dr. Diona Browner    Location [facility]    Staff Present Rodena Medin, RN, Pleas Koch, RN, BSN;Heather Fredric Mare, BS, Exercise Physiologist;Tyreque Finken, RN    Virtual Visit No    Medication changes reported     No    Fall or balance concerns reported    No    Tobacco Cessation No Change    Warm-up and Cool-down Performed as group-led instruction    Resistance Training Performed Yes    VAD Patient? No    PAD/SET Patient? No      Pain Assessment   Currently in Pain? No/denies    Pain Score 0-No pain    Multiple Pain Sites No             Capillary Blood Glucose: No results found for this or any previous visit (from the past 24 hour(s)).    Social History   Tobacco Use  Smoking Status Former   Types: Cigarettes   Quit date: 11/18/1979   Years since quitting: 42.5  Smokeless Tobacco Former    Goals Met:  Independence with exercise equipment Exercise tolerated well No report of concerns or symptoms today Strength training completed today  Goals Unmet:  Not Applicable  Comments: check out @ 9:15am   Dr. Dina Rich is Medical Director for [facility] Cardiac Rehab

## 2022-05-31 NOTE — Progress Notes (Signed)
I have reviewed a Home Exercise Prescription with Roger Powers . Roger Powers is currently exercising at home by walking .The patient was advised to walk 2-5 days a week for 30-45 minutes.  [name] and I discussed how to progress their exercise prescription.  The patient stated that their goals were to maintain a healthy lifestyle and to keep exercising. The patient stated that they understand the exercise prescription.  We reviewed exercise guidelines, target heart rate during exercise, RPE Scale, weather conditions, NTG use, endpoints for exercise, warmup and cool down.  Patient is encouraged to come to me with any questions. I will continue to follow up with the patient to assist them with progression and safety.

## 2022-06-02 ENCOUNTER — Encounter (HOSPITAL_COMMUNITY)
Admission: RE | Admit: 2022-06-02 | Discharge: 2022-06-02 | Disposition: A | Discharge status: Home / Self Care | Payer: Medicare HMO | Source: Ambulatory Visit | Attending: Internal Medicine | Admitting: Internal Medicine

## 2022-06-02 DIAGNOSIS — Z955 Presence of coronary angioplasty implant and graft: Secondary | ICD-10-CM | POA: Diagnosis not present

## 2022-06-02 NOTE — Progress Notes (Signed)
Daily Session Note  Patient Details  Name: Roger Powers MRN: [id number] Date of Birth: 04-Sep-1955 Referring Provider:   Flowsheet Row CARDIAC REHAB PHASE II ORIENTATION from 03/18/2022 in [facility] CARDIAC REHABILITATION  Referring Provider Dr. Eldridge Dace       Encounter Date: 06/02/2022  Check In:  Session Check In - 06/02/22 0815       Check-In   Supervising physician immediately available to respond to emergencies [facility] MD immediately available    Physician(s) Dr. Diona Browner    Location [facility]    Staff Present Ross Ludwig, BS, Exercise Physiologist;Hillary Troutman BSN, RN;Draco Malczewski Laural Benes, RN, BSN    Virtual Visit No    Medication changes reported     No    Fall or balance concerns reported    No    Tobacco Cessation No Change    Warm-up and Cool-down Performed as group-led instruction    Resistance Training Performed Yes    VAD Patient? No    PAD/SET Patient? No      Pain Assessment   Currently in Pain? No/denies    Pain Score 0-No pain    Multiple Pain Sites No             Capillary Blood Glucose: No results found for this or any previous visit (from the past 24 hour(s)).    Social History   Tobacco Use  Smoking Status Former   Types: Cigarettes   Quit date: 11/18/1979   Years since quitting: 42.5  Smokeless Tobacco Former    Goals Met:  Independence with exercise equipment Exercise tolerated well No report of concerns or symptoms today Strength training completed today  Goals Unmet:  Not Applicable  Comments: Check out 915.   Dr. Dina Rich is Medical Director for [facility] Cardiac Rehab

## 2022-06-04 ENCOUNTER — Encounter (HOSPITAL_COMMUNITY)
Admission: RE | Admit: 2022-06-04 | Discharge: 2022-06-04 | Disposition: A | Discharge status: Home / Self Care | Payer: Medicare HMO | Source: Ambulatory Visit | Attending: Internal Medicine | Admitting: Internal Medicine

## 2022-06-04 DIAGNOSIS — Z955 Presence of coronary angioplasty implant and graft: Secondary | ICD-10-CM | POA: Diagnosis not present

## 2022-06-04 NOTE — Progress Notes (Signed)
Daily Session Note  Patient Details  Name: Roger Powers MRN: [id number] Date of Birth: 09-15-1955 Referring Provider:   Flowsheet Row CARDIAC REHAB PHASE II ORIENTATION from 03/18/2022 in [facility] CARDIAC REHABILITATION  Referring Provider Dr. Eldridge Dace       Encounter Date: 06/04/2022  Check In:  Session Check In - 06/04/22 0815       Check-In   Supervising physician immediately available to respond to emergencies [facility] MD immediately available    Physician(s) Dr. Diona Browner    Location [facility]    Staff Present Ross Ludwig, BS, Exercise Physiologist;Other;Debra Laural Benes, RN, BSN    Virtual Visit No    Medication changes reported     No    Fall or balance concerns reported    No    Tobacco Cessation No Change    Warm-up and Cool-down Performed as group-led instruction    Resistance Training Performed Yes    VAD Patient? No    PAD/SET Patient? No      Pain Assessment   Currently in Pain? No/denies    Pain Score 0-No pain    Multiple Pain Sites No             Capillary Blood Glucose: No results found for this or any previous visit (from the past 24 hour(s)).    Social History   Tobacco Use  Smoking Status Former   Types: Cigarettes   Quit date: 11/18/1979   Years since quitting: 42.5  Smokeless Tobacco Former    Goals Met:  Independence with exercise equipment Exercise tolerated well No report of concerns or symptoms today Strength training completed today  Goals Unmet:  Not Applicable  Comments: check out 0915   Dr. Dina Rich is Medical Director for [facility] Cardiac Rehab

## 2022-06-07 ENCOUNTER — Encounter (HOSPITAL_COMMUNITY)
Admission: RE | Admit: 2022-06-07 | Discharge: 2022-06-07 | Disposition: A | Discharge status: Home / Self Care | Payer: Medicare HMO | Source: Ambulatory Visit | Attending: Internal Medicine | Admitting: Internal Medicine

## 2022-06-07 DIAGNOSIS — Z955 Presence of coronary angioplasty implant and graft: Secondary | ICD-10-CM

## 2022-06-07 NOTE — Progress Notes (Signed)
Daily Session Note  Patient Details  Name: Roger Powers MRN: [id number] Date of Birth: October 30, 1955 Referring [name]  Flowsheet Row CARDIAC REHAB PHASE II ORIENTATION from 03/18/2022 in [facility] CARDIAC REHABILITATION  Referring Provider Dr. Eldridge Dace       Encounter Date: 06/07/2022  Check In:  Session Check In - 06/07/22 0815       Check-In   Supervising physician immediately available to respond to emergencies [facility] MD immediately available    Physician(s) Dr. Jenene Slicker    Location [facility]    Staff Present Ross Ludwig, BS, Exercise Physiologist;Other    Virtual Visit No    Medication changes reported     No    Fall or balance concerns reported    No    Tobacco Cessation No Change    Warm-up and Cool-down Performed as group-led instruction    Resistance Training Performed Yes    VAD Patient? No    PAD/SET Patient? No      Pain Assessment   Currently in Pain? No/denies    Pain Score 0-No pain    Multiple Pain Sites No             Capillary Blood Glucose: No results found for this or any previous visit (from the past 24 hour(s)).    Social History   Tobacco Use  Smoking Status Former   Types: Cigarettes   Quit date: 11/18/1979   Years since quitting: 42.5  Smokeless Tobacco Former    Goals Met:  Independence with exercise equipment Exercise tolerated well No report of concerns or symptoms today Strength training completed today  Goals Unmet:  Not Applicable  Comments: check out 0915   Dr. Dina Rich is Medical Director for [facility] Cardiac Rehab

## 2022-06-09 ENCOUNTER — Encounter (HOSPITAL_COMMUNITY)
Admission: RE | Admit: 2022-06-09 | Discharge: 2022-06-09 | Disposition: A | Discharge status: Home / Self Care | Payer: Medicare HMO | Source: Ambulatory Visit | Attending: Internal Medicine | Admitting: Internal Medicine

## 2022-06-09 VITALS — Ht 71.0 in | Wt 207.5 lb

## 2022-06-09 DIAGNOSIS — Z955 Presence of coronary angioplasty implant and graft: Secondary | ICD-10-CM

## 2022-06-09 NOTE — Progress Notes (Signed)
Daily Session Note  Patient Details  Name: ORVIE CARADINE MRN: [id number] Date of Birth: [birth year] Referring Provider:   Flowsheet Row CARDIAC REHAB PHASE II ORIENTATION from 03/18/2022 in [facility] CARDIAC REHABILITATION  Referring Provider Dr. Eldridge Dace       Encounter Date: 06/09/2022  Check In:  Session Check In - 06/09/22 0815       Check-In   Supervising physician immediately available to respond to emergencies [facility] MD immediately available    Physician(s) Dr. Jenene Slicker    Location [facility]    Staff Present Ross Ludwig, BS, Exercise Physiologist;Debra Laural Benes, RN, BSN;Christy Edwards, RN, BSN    Virtual Visit No    Medication changes reported     No    Fall or balance concerns reported    No    Tobacco Cessation No Change    Warm-up and Cool-down Performed as group-led Writer Performed Yes    VAD Patient? No    PAD/SET Patient? No      Pain Assessment   Currently in Pain? No/denies    Pain Score 0-No pain    Multiple Pain Sites No             Capillary Blood Glucose: No results found for this or any previous visit (from the past 24 hour(s)).    Social History   Tobacco Use  Smoking Status Former   Types: Cigarettes   Quit date: 11/18/1979   Years since quitting: 42.5  Smokeless Tobacco Former    Goals Met:  Independence with exercise equipment Exercise tolerated well No report of concerns or symptoms today Strength training completed today  Goals Unmet:  Not Applicable  Comments: check out 0915   Dr. Dina Rich is Medical Director for [facility] Cardiac Rehab

## 2022-06-11 ENCOUNTER — Encounter (HOSPITAL_COMMUNITY): Payer: Medicare HMO

## 2022-06-14 ENCOUNTER — Encounter (HOSPITAL_COMMUNITY): Payer: Medicare HMO

## 2022-06-16 ENCOUNTER — Encounter (HOSPITAL_COMMUNITY): Payer: Medicare HMO

## 2022-06-17 ENCOUNTER — Encounter: Payer: Self-pay | Admitting: Psychiatry

## 2022-06-17 NOTE — Progress Notes (Signed)
Discharge Progress Report  Patient Details  Name: Roger Powers MRN: [id number] Date of Birth: Nov 09, 1955 Referring Provider:   Flowsheet Row CARDIAC REHAB PHASE II ORIENTATION from 03/18/2022 in [facility] CARDIAC REHABILITATION  Referring Provider Dr. Eldridge Dace        Number of Visits: 33  Reason for Discharge:  Patient reached a stable level of exercise. Patient independent in their exercise. Patient has met program and personal goals.  Smoking History:  Social History   Tobacco Use  Smoking Status Former   Types: Cigarettes   Quit date: 11/18/1979   Years since quitting: 42.6  Smokeless Tobacco Former    Diagnosis:  S/P drug eluting coronary stent placement  ADL [facility]:   Initial Exercise Prescription:  Initial Exercise Prescription - 03/18/22 1400       Date of Initial Exercise RX and Referring Provider   Date 03/18/22    Referring Provider Dr. Eldridge Dace    Expected Discharge Date 06/18/22      Treadmill   MPH 2    Grade 0    Minutes 17      Recumbant Elliptical   Level 1    RPM 60    Minutes 22      Prescription Details   Frequency (times per week) 3    Duration Progress to 30 minutes of continuous aerobic without signs/symptoms of physical distress      Intensity   THRR 40-80% of Max Heartrate 61-122    Ratings of Perceived Exertion 11-13      Resistance Training   Training Prescription Yes    Weight 4    Reps 10-15             Discharge Exercise Prescription (Final Exercise Prescription Changes):  Exercise Prescription Changes - 05/31/22 1100       Home Exercise Plan   Plans to continue exercise at Home (comment)    Frequency Add 2 additional days to program exercise sessions.    Initial Home Exercises Provided 05/31/22             Functional Capacity:  6 Minute Walk     Row Name 03/18/22 1401 06/09/22 1419       6 Minute Walk   Phase Initial Discharge    Distance 1550 feet 2000 feet    Distance % Change -- 29 %    Walk  Time 6 minutes 6 minutes    # of Rest Breaks 0 0    MPH 2.93 3.78    METS 3.22 4.37    RPE 12 12    VO2 Peak 11.27 15.29    Symptoms No No    Resting HR 72 bpm 67 bpm    Resting BP 112/60 150/66    Resting Oxygen Saturation  96 % 97 %    Exercise Oxygen Saturation  during 6 min walk 94 % 97 %    Max Ex. HR 84 bpm 95 bpm    Max Ex. BP 130/60 162/68    2 Minute Post BP 112/60 142/62             Psychological, QOL, Others - Outcomes: PHQ 2/9:    06/17/2022   10:46 AM 03/18/2022    1:42 PM  Depression screen PHQ 2/9  Decreased Interest 0 0  Down, Depressed, Hopeless 0 0  PHQ - 2 Score 0 0  Altered sleeping 0 2  Tired, decreased energy 1 3  Change in appetite 0 1  Feeling bad or  failure about yourself  0 0  Trouble concentrating 0 0  Moving slowly or fidgety/restless 0 0  Suicidal thoughts 0 0  PHQ-9 Score 1 6  Difficult doing work/chores Not difficult at all Somewhat difficult    Quality of Life:  Quality of Life - 06/09/22 1420       Quality of Life   Select Quality of Life      Quality of Life Scores   Health/Function Pre 11.97 %    Health/Function Post 21 %    Health/Function % Change 75.44 %    Socioeconomic Pre 17.5 %    Socioeconomic Post 21.21 %    Socioeconomic % Change  21.2 %    Psych/Spiritual Pre 16.93 %    Psych/Spiritual Post 18.79 %    Psych/Spiritual % Change 10.99 %    Family Pre 13.8 %    Family Post 17.9 %    Family % Change 29.71 %    GLOBAL Pre 14.4 %    GLOBAL Post 20.13 %    GLOBAL % Change 39.79 %             Personal Goals: Goals established at orientation with interventions provided to work toward goal.  Personal Goals and Risk Factors at Admission - 03/18/22 1357       Core Components/Risk Factors/Patient Goals on Admission    Weight Management Weight Maintenance    Improve shortness of breath with ADL's Yes    Intervention Provide education, individualized exercise plan and daily activity instruction to help decrease  symptoms of SOB with activities of daily living.    Expected Outcomes Short Term: Improve cardiorespiratory fitness to achieve a reduction of symptoms when performing ADLs;Long Term: Be able to perform more ADLs without symptoms or delay the onset of symptoms    Hypertension Yes    Intervention Provide education on lifestyle modifcations including regular physical activity/exercise, weight management, moderate sodium restriction and increased consumption of fresh fruit, vegetables, and low fat dairy, alcohol moderation, and smoking cessation.;Monitor prescription use compliance.    Expected Outcomes Short Term: Continued assessment and intervention until BP is < 140/102mm HG in hypertensive participants. < 130/18mm HG in hypertensive participants with diabetes, heart failure or chronic kidney disease.;Long Term: Maintenance of blood pressure at goal levels.    Lipids Yes    Intervention Provide education and support for participant on nutrition & aerobic/resistive exercise along with prescribed medications to achieve LDL 70mg , HDL >40mg .    Expected Outcomes Short Term: Participant states understanding of desired cholesterol values and is compliant with medications prescribed. Participant is following exercise prescription and nutrition guidelines.    Personal Goal Other Yes    Personal Goal Patient wants to get healthier; learn how to exercise; to be able to continue to do the activities he is doing; and improve his SOB.    Intervention Patient will attend CR 3 days/week with exercise and education.    Expected Outcomes Patient will complete the program meeting both personal and program goals.              Personal Goals Discharge:  Goals and Risk Factor Review     Row Name 04/05/22 1131 05/03/22 1056 06/17/22 1055         Core Components/Risk Factors/Patient Goals Review   Personal Goals Review Weight Management/Obesity;Hypertension;Lipids;Other Weight  Management/Obesity;Hypertension;Lipids;Other Weight Management/Obesity;Hypertension;Lipids;Other     Review Patient was referred to CR with DES. He has multiple risk factors for CAD and is participating in  the program for risk modification. He has completed 7 sessions. He is doing well in the program. His blood pressure has been labile. We will continue to monitor. His personal goals for the program are to get healthier; learn how to exercise; to be able to continue to do the activities he is doing and improve his SOB. We will continue to monitor his progress as he works towards meeting these goals. Patient has completed 16 sessions. His current weight is 209.6 lbs. He continues to do well in the program with consistent attendance and progressions. His blood pressure has improved with some elvated readings. We will continue to monitor. His personal goals for the program are to get healthier; learn how to exercise; to be able to continue to do the activities he is doing and improve his SOB. We will continue to monitor his progress as he works towards meeting these goals. Patient graduated from the program with 33 sessions. His discharge weight was 207.5 lbs losing 3.9 lbs overall. His exit walk test improved by 29%. His blood pressure was not at goal at discharge but Dr. Tenny Craw has been notified and they are working on [employer] adjustments. Patient says the program helped him meet his goals and he does feel better than when he started and feels like he is working toward making lifestyle changes for a healthier life. He plans to continue exercising by walking and using his exercise bike.     Expected Outcomes Patient will complete the program meeting both personal and program goals. Patient will complete the program meeting both personal and program goals. Patient will continue to exercise by walking and using his exercise bike.              Exercise Goals and Review:  Exercise Goals     Row Name 03/18/22  1404 04/12/22 0953 05/10/22 1242         Exercise Goals   Increase Physical Activity Yes Yes Yes     Intervention Provide advice, education, support and counseling about physical activity/exercise needs.;Develop an individualized exercise prescription for aerobic and resistive training based on initial evaluation findings, risk stratification, comorbidities and participant's personal goals. Provide advice, education, support and counseling about physical activity/exercise needs.;Develop an individualized exercise prescription for aerobic and resistive training based on initial evaluation findings, risk stratification, comorbidities and participant's personal goals. Provide advice, education, support and counseling about physical activity/exercise needs.;Develop an individualized exercise prescription for aerobic and resistive training based on initial evaluation findings, risk stratification, comorbidities and participant's personal goals.     Expected Outcomes Short Term: Attend rehab on a regular basis to increase amount of physical activity.;Long Term: Add in home exercise to make exercise part of routine and to increase amount of physical activity.;Long Term: Exercising regularly at least 3-5 days a week. Short Term: Attend rehab on a regular basis to increase amount of physical activity.;Long Term: Add in home exercise to make exercise part of routine and to increase amount of physical activity.;Long Term: Exercising regularly at least 3-5 days a week. Short Term: Attend rehab on a regular basis to increase amount of physical activity.;Long Term: Add in home exercise to make exercise part of routine and to increase amount of physical activity.;Long Term: Exercising regularly at least 3-5 days a week.     Increase Strength and Stamina Yes Yes Yes     Intervention Provide advice, education, support and counseling about physical activity/exercise needs.;Develop an individualized exercise prescription for  aerobic and resistive training based  on initial evaluation findings, risk stratification, comorbidities and participant's personal goals. Provide advice, education, support and counseling about physical activity/exercise needs.;Develop an individualized exercise prescription for aerobic and resistive training based on initial evaluation findings, risk stratification, comorbidities and participant's personal goals. Provide advice, education, support and counseling about physical activity/exercise needs.;Develop an individualized exercise prescription for aerobic and resistive training based on initial evaluation findings, risk stratification, comorbidities and participant's personal goals.     Expected Outcomes Short Term: Increase workloads from initial exercise prescription for resistance, speed, and METs.;Short Term: Perform resistance training exercises routinely during rehab and add in resistance training at home;Long Term: Improve cardiorespiratory fitness, muscular endurance and strength as measured by increased METs and functional capacity ( ) Short Term: Increase workloads from initial exercise prescription for resistance, speed, and METs.;Short Term: Perform resistance training exercises routinely during rehab and add in resistance training at home;Long Term: Improve cardiorespiratory fitness, muscular endurance and strength as measured by increased METs and functional capacity ( ) Short Term: Increase workloads from initial exercise prescription for resistance, speed, and METs.;Short Term: Perform resistance training exercises routinely during rehab and add in resistance training at home;Long Term: Improve cardiorespiratory fitness, muscular endurance and strength as measured by increased METs and functional capacity ( )     Able to understand and use rate of perceived exertion (RPE) scale Yes -- Yes     Intervention Provide education and explanation on how to use RPE scale Provide education  and explanation on how to use RPE scale Provide education and explanation on how to use RPE scale     Expected Outcomes Short Term: Able to use RPE daily in rehab to express subjective intensity level;Long Term:  Able to use RPE to guide intensity level when exercising independently Short Term: Able to use RPE daily in rehab to express subjective intensity level;Long Term:  Able to use RPE to guide intensity level when exercising independently Short Term: Able to use RPE daily in rehab to express subjective intensity level;Long Term:  Able to use RPE to guide intensity level when exercising independently     Knowledge and understanding of Target Heart Rate Range (THRR) Yes Yes Yes     Intervention Provide education and explanation of THRR including how the numbers were predicted and where they are located for reference Provide education and explanation of THRR including how the numbers were predicted and where they are located for reference Provide education and explanation of THRR including how the numbers were predicted and where they are located for reference     Expected Outcomes Short Term: Able to state/look up THRR;Short Term: Able to use daily as guideline for intensity in rehab;Long Term: Able to use THRR to govern intensity when exercising independently Short Term: Able to state/look up THRR;Short Term: Able to use daily as guideline for intensity in rehab;Long Term: Able to use THRR to govern intensity when exercising independently Short Term: Able to state/look up THRR;Short Term: Able to use daily as guideline for intensity in rehab;Long Term: Able to use THRR to govern intensity when exercising independently     Able to check pulse independently Yes Yes Yes     Intervention Provide education and demonstration on how to check pulse in carotid and radial arteries.;Review the importance of being able to check your own pulse for safety during independent exercise Provide education and demonstration  on how to check pulse in carotid and radial arteries.;Review the importance of being able to check your own pulse for safety during  independent exercise Provide education and demonstration on how to check pulse in carotid and radial arteries.;Review the importance of being able to check your own pulse for safety during independent exercise     Expected Outcomes Short Term: Able to explain why pulse checking is important during independent exercise;Long Term: Able to check pulse independently and accurately Short Term: Able to explain why pulse checking is important during independent exercise;Long Term: Able to check pulse independently and accurately Short Term: Able to explain why pulse checking is important during independent exercise;Long Term: Able to check pulse independently and accurately     Understanding of Exercise Prescription Yes Yes Yes     Intervention Provide education, explanation, and written materials on patient's individual exercise prescription Provide education, explanation, and written materials on patient's individual exercise prescription Provide education, explanation, and written materials on patient's individual exercise prescription     Expected Outcomes Short Term: Able to explain program exercise prescription;Long Term: Able to explain home exercise prescription to exercise independently Short Term: Able to explain program exercise prescription;Long Term: Able to explain home exercise prescription to exercise independently Short Term: Able to explain program exercise prescription;Long Term: Able to explain home exercise prescription to exercise independently              Exercise Goals Re-Evaluation:  Exercise Goals Re-Evaluation     Row Name 04/12/22 0954 05/10/22 1243           Exercise Goal Re-Evaluation   Exercise Goals Review Increase Physical Activity;Increase Strength and Stamina;Able to understand and use rate of perceived exertion (RPE) scale;Knowledge and  understanding of Target Heart Rate Range (THRR);Able to check pulse independently;Understanding of Exercise Prescription Increase Physical Activity;Increase Strength and Stamina;Able to understand and use rate of perceived exertion (RPE) scale;Knowledge and understanding of Target Heart Rate Range (THRR);Able to check pulse independently;Understanding of Exercise Prescription      Comments Pt has completed 11 session of cardiac rehab. He is very motivated during class and is pushing himself each session. He is currently exercising at 5.749 METs on the treadmill. He did push himself too hard one day last week, staff lowered his workload during session due to HR, causing his HR to take longer to come down then normal. Will continue to monitor and progress as able. Pt has completed 20 sessions of cardiac rehab. He continues to be motivated during class. He is pushing himself each session and increasing his workload. He is curretnly exercising at 5.8 METs on the ellp. Will continue to monitor and progress as able.      Expected Outcomes Through exercise at rehab and home, the patient will meet thier stated goals Through exercise at rehab and home, the patient will meet thier stated goals               Nutrition & Weight - Outcomes:  Pre Biometrics - 03/18/22 1404       Pre Biometrics   Height 5\' 11"  (1.803 m)    Weight 95.8 kg    Waist Circumference 41 inches    Hip Circumference 39.5 inches    Waist to Hip Ratio 1.04 %    BMI (Calculated) 29.47    Triceps Skinfold 10 mm    % Body Fat 26.7 %    Grip Strength 44.4 kg    Flexibility 6.5 in    Single Leg Stand 40.48 seconds             Post Biometrics - 06/09/22 1420  Post  Biometrics   Height 5\' 11"  (1.803 m)    Weight 94.1 kg    Waist Circumference 39 inches    Hip Circumference 41 inches    Waist to Hip Ratio 0.95 %    BMI (Calculated) 28.95    Triceps Skinfold 9 mm    % Body Fat 25.1 %    Grip Strength 50.9 kg     Flexibility 8 in    Single Leg Stand 60 seconds             Nutrition:  Nutrition Therapy & Goals - 03/18/22 1354       Personal Nutrition Goals   Comments Patient scored 15 on his diet assessment. He says he cooks all of his meals and follows a low sodium diet. Handout explained and provided regarding healthier choices. We offer 2 educational sessions on heart healthy nutiriton with handouts and assistance with RD referral if patient is interested.      Intervention Plan   Intervention Nutrition handout(s) given to patient.             Nutrition Discharge:  Nutrition Assessments - 06/17/22 1047       MEDFICTS Scores   Post Score 7             Education Questionnaire Score:  Knowledge Questionnaire Score - 06/17/22 1047       Knowledge Questionnaire Score   Post Score 23/24            Patient graduated from the program with 33 sessions. His discharge weight was 207.5 lbs losing 3.9 lbs overall. His exit walk test improved by 29%. His blood pressure was not at goal at discharge but Dr. Tenny Craw has been notified and they are working on [employer] adjustments. Patient says the program helped him meet his goals and he does feel better than when he started and feels like he is working toward making lifestyle changes for a healthier life. He plans to continue exercising by walking and using his exercise bike.  Goals reviewed with patient; copy given to patient.

## 2022-06-18 ENCOUNTER — Encounter (HOSPITAL_COMMUNITY): Payer: Medicare HMO

## 2022-08-11 ENCOUNTER — Ambulatory Visit: Payer: Medicare HMO | Admitting: Internal Medicine

## 2022-09-08 DIAGNOSIS — I421 Obstructive hypertrophic cardiomyopathy: Secondary | ICD-10-CM | POA: Diagnosis not present

## 2022-09-08 DIAGNOSIS — R7303 Prediabetes: Secondary | ICD-10-CM | POA: Diagnosis not present

## 2022-09-08 DIAGNOSIS — Z955 Presence of coronary angioplasty implant and graft: Secondary | ICD-10-CM | POA: Diagnosis not present

## 2022-09-08 DIAGNOSIS — I2510 Atherosclerotic heart disease of native coronary artery without angina pectoris: Secondary | ICD-10-CM | POA: Diagnosis not present

## 2022-09-29 ENCOUNTER — Ambulatory Visit: Payer: Medicare HMO | Admitting: Internal Medicine

## 2022-10-04 NOTE — Progress Notes (Unsigned)
Cardiology Office Note   Date:  10/05/2022   ID:  Roger Powers 1955-07-16, MRN [id number]  PCP:  Gareth Morgan, MD  Cardiologist:   Dietrich Pates, MD   Pt returns for f/u of CP and CAD      History of Present Illness: Roger Powers is a 67 y.o. male with a history of CAD   Ca score in 2018 was 339   Aorta 43 mm    Stress myoview  2021 without ischemia In Dec 2023  Coronary CTA showed signif stensosis in LAD   REcomm LHC which showed 75% LAD stenosis in mid LAD   Underwent PCI/DES to mid LAD    Plan for ASA / Plavix for at least 6 months    I saw the pt in APril 2024  Since seen he has done well   Breathing OK  No CP   Walks regularly    He is working on diet   GOod, then [employer] when relatives/company come to visit     Current Meds  Medication Sig   aspirin 81 MG tablet Take 81 mg by mouth daily.   atorvastatin (LIPITOR) 80 MG tablet Take 1 tablet (80 mg total) by mouth daily.   clobetasol (TEMOVATE) 0.05 % external solution Apply 1 application  topically daily as needed (psoriasis).   clopidogrel (PLAVIX) 75 MG tablet Take 1 tablet (75 mg total) by mouth daily with breakfast.   diazepam (VALIUM) 10 MG tablet Take 10 mg by mouth every 8 (eight) hours as needed for anxiety.   ezetimibe (ZETIA) 10 MG tablet Take 1 tablet (10 mg total) by mouth daily.   hydrochlorothiazide (MICROZIDE) 12.5 MG capsule Take 1 capsule (12.5 mg total) by mouth daily.   lisinopril (ZESTRIL) 40 MG tablet TAKE ONE TABLET (40MG  TOTAL) BY MOUTH DAILY   metoprolol succinate (TOPROL XL) 25 MG 24 hr tablet Take 1 tablet (25 mg total) by mouth daily.   Multiple Vitamins-Minerals (MULTIVITAMIN MEN 50+ PO) Take 1 tablet by mouth daily.   nitroGLYCERIN (NITROSTAT) 0.4 MG SL tablet Place 1 tablet (0.4 mg total) under the tongue every 5 (five) minutes as needed for chest pain.   OVER THE COUNTER MEDICATION Take 1 tablet by mouth daily. Dynamic Brain Support   OVER THE COUNTER MEDICATION Take 1 tablet by mouth daily.  Immune Support Multivitamin   pantoprazole (PROTONIX) 40 MG tablet Take 1 tablet (40 mg total) by mouth daily.   zolpidem (AMBIEN) 10 MG tablet Take 10 mg by mouth at bedtime as needed for sleep.      Allergies:   Patient has no known allergies.   Past Medical History:  Diagnosis Date   CAD (coronary artery disease)    02/12/22: LHC DES 2.50x16 to midLAD, residual dz tx medically   Cataract    RIGHT eye only   GERD (gastroesophageal reflux disease)    hx of- with certain foods- on meds   Hypercholesteremia    on meds   Hypertension    on meds   Psoriasis     Past Surgical History:  Procedure Laterality Date   CATARACT EXTRACTION W/ INTRAOCULAR LENS IMPLANT Right 2013   COLONOSCOPY  2004   patient cannot remember where/MD   CORONARY STENT INTERVENTION N/A 02/12/2022   Procedure: CORONARY STENT INTERVENTION;  Surgeon: Corky Crafts, MD;  Location: [facility] INVASIVE CV LAB;  Service: Cardiovascular;  Laterality: N/A;   LEFT HEART CATH AND CORONARY ANGIOGRAPHY N/A 02/12/2022   Procedure:  LEFT HEART CATH AND CORONARY ANGIOGRAPHY;  Surgeon: Corky Crafts, MD;  [facility]: [facility] INVASIVE CV LAB;  Service: Cardiovascular;  Laterality: N/A;   RETINAL DETACHMENT SURGERY Right 2012   WISDOM TOOTH EXTRACTION       Social History:  The patient  reports that he quit smoking about 42 years ago. His smoking use included cigarettes. He has quit using smokeless tobacco. He reports current alcohol use of about 2.0 standard drinks of alcohol per week. He reports that he does not use drugs.   Family History:  The patient's family history includes Breast cancer in his mother; Diabetes in his mother; Heart disease in his brother, brother, and father; Lung cancer in his mother.    ROS:  Please see the history of present illness. All other systems are reviewed and  Negative to the above problem except as noted.    PHYSICAL EXAM: VS:  BP 122/88   Pulse 64   Ht 5\' 11"  (1.803 m)   Wt 224 lb  3.2 oz (101.7 kg)   SpO2 96%   BMI 31.27 kg/m   GEN: Overweight 67 yo  in no acute distress  HEENT: normal  Neck: JVP normal;  Cardiac: RRR; No murmurs   No LE  edema  Respiratory:  clear to auscultation  GI: soft, nontender, nondistended   No hepatomegaly    EKG:  EKG not done   Echo  Jan 2024  1. Left ventricular ejection fraction, by estimation, is 65 to 70%. The  left ventricle has normal function. The left ventricle has no regional  wall motion abnormalities. There is moderate asymmetric left ventricular  hypertrophy of the basal-septal  segment. Left ventricular diastolic parameters are indeterminate. Elevated  left atrial pressure. The average left ventricular global longitudinal  strain is -19.9 %. The global longitudinal strain is normal.   2. Right ventricular systolic function is normal. The right ventricular  size is normal.   3. The mitral valve is normal in structure. No evidence of mitral valve  regurgitation. No evidence of mitral stenosis.   4. The aortic valve is tricuspid. There is mild calcification of the  aortic valve. There is mild thickening of the aortic valve. Aortic valve  regurgitation is not visualized. No aortic stenosis is present.   5. The inferior vena cava is normal in size with greater than 50%  respiratory variability, suggesting right atrial pressure of 3 mmHg.    LHC   Dec 2024    Prox RCA lesion is 25% stenosed.   RPAV lesion is 50% stenosed.   Mid LAD-1 lesion is 25% stenosed.   Mid LAD-2 lesion is 40% stenosed.   Mid LAD-3 lesion is 75% stenosed.  A drug-eluting stent was successfully placed using a SYNERGY XD 2.50X16, postdilated to greater than 2.75 mm.   Post intervention, there is a 0% residual stenosis.   The left ventricular systolic function is normal.   LV end diastolic pressure is normal.  LVEDP 8 mmHg.   The left ventricular ejection fraction is 55-65% by visual estimate.   There is no aortic valve stenosis.   There was  tortuosity in the right subclavian making catheter manipulation somewhat difficult.  There was right radial spasm.   Successful PCI of the mid LAD.  Mild to moderate disease elsewhere in the coronary tree.  Continue aggressive preventive therapy along with dual antiplatelet therapy for at least 6 months.  After 6 months, would consider clopidogrel monotherapy going forward given his diffuse  atherosclerosis and calcification.  CT Dec 2024   Coronary calcium score is 847, which places the patient in the 85th percentile for age and sex matched control.   Coronary arteries: Normal coronary origins.  Right dominance.   Right Coronary Artery: Severe mixed atherosclerotic plaque in the proximal RCA, 70-99% stenosis. Mild mixed atherosclerotic plaque in the mid RCA with low attenuation plaque, 25-49% stenosis. Mild scattered plaque in the distal RCA, PDA and PL, 25-49% stenosis.   Left Main Coronary Artery: Minimal distal LM mixed atherosclerotic plaque, <25% stenosis.   Left Anterior Descending Coronary Artery: Mild mixed atherosclerotic plaque in the proximal LAD, 25-49% stenosis. Moderate mixed atherosclerotic plaque in the mid LAD at the level of the D1 bifurcation, 50-69% stenosis. Minimal ostial plaque in D1, <25% stenosis. Severe mixed atherosclerotic plaque in the mid LAD just distal to the D1 takeoff, 70-99% stenosis, and a serial severe stenosis 70-99% stenosis in the mid LAD. Distal vessel has mild scattered disease.   Ramus intermedius: Mild mixed atherosclerotic plaque in the mid RI, 25-49%.   Left Circumflex Artery: Mild mixed atherosclerotic plaque in the proximal LCx, 25-49%.   Aorta: Mild dilation, 42 x 40 x 39 mm at sinus of Valsalva, 40 mm at the mid ascending aorta (level of the PA bifurcation) measured double oblique.   Aortic Valve: No calcifications.   Other findings:   Normal pulmonary vein drainage into the left atrium.   Normal left atrial appendage  without thrombus.   Mild dilation of main and branch pulmonary arteries   Severe basal septal hypertrophy, 16-17 mm.   Please see separate report from [facility] for non-cardiac findings.   IMPRESSION: 1. Severe CAD in the proximal RCA and mid LAD, 70-99% stenosis, CADRADS 4. CT FFR will be performed and reported separately.   2. Coronary calcium score is 847, which places the patient in the 85th percentile for age and sex matched control.   3. Normal coronary origins with right dominance.   4. Severe basal septal hypertrophy, 16-17 mm.   5. Mild dilation of main and branch pulmonary arteries, which may indicate increased pulmonary pressures.   6. Mild dilation of ascending aorta, 42 mm at sinus of Valsalva, 40 mm at mid ascending aorta.   Lipid Panel    Component Value Date/Time   CHOL 107 05/12/2022 0707   TRIG 363 (H) 05/12/2022 0707   HDL 28 (L) 05/12/2022 0707   CHOLHDL 3.8 05/12/2022 0707   VLDL 37 (H) 09/29/2016 0737   LDLCALC 41 05/12/2022 0707      Wt Readings from Last 3 Encounters:  10/05/22 224 lb 3.2 oz (101.7 kg)  06/09/22 207 lb 7.3 oz (94.1 kg)  05/25/22 210 lb 3.2 oz (95.3 kg)      ASSESSMENT AND PLAN:  1  CAD  Ca score 847  Pt s/p cath with intervention to LAD   Doing well clniically  Doing well on current regimen   Check las toda   2  HTN  Pt is on lisinopril 40  and tprol  BP better   120s  /   Follow    4  Aortic dilation  42 mm on CT in Dec 2023  Follow periodically      4   HL   Recent lipids   LDL 41  HDL 28  Trig 363    Lpa low     Keep on statin  5  Metabolics   WIll check A1C and Vit D  Follow up6 months        Current medicines are reviewed at length with the patient today.  The patient does not have concerns regarding medicines.  Signed, Dietrich Pates, MD  10/05/2022 10:20 AM    [facility] [address] [place], [place], [place]  [address] Phone: [phone]; Fax: [phone]

## 2022-10-05 ENCOUNTER — Ambulatory Visit: Payer: Medicare HMO | Attending: Internal Medicine | Admitting: Internal Medicine

## 2022-10-05 ENCOUNTER — Encounter: Payer: Self-pay | Admitting: Internal Medicine

## 2022-10-05 VITALS — BP 122/88 | HR 64 | Ht 71.0 in | Wt 224.2 lb

## 2022-10-05 DIAGNOSIS — I2510 Atherosclerotic heart disease of native coronary artery without angina pectoris: Secondary | ICD-10-CM | POA: Diagnosis not present

## 2022-10-05 DIAGNOSIS — E782 Mixed hyperlipidemia: Secondary | ICD-10-CM | POA: Diagnosis not present

## 2022-10-05 DIAGNOSIS — Z79899 Other long term (current) drug therapy: Secondary | ICD-10-CM

## 2022-10-05 DIAGNOSIS — I10 Essential (primary) hypertension: Secondary | ICD-10-CM

## 2022-10-05 DIAGNOSIS — E785 Hyperlipidemia, unspecified: Secondary | ICD-10-CM | POA: Diagnosis not present

## 2022-10-05 NOTE — Patient Instructions (Addendum)
Medication Instructions:   *If you need a refill on your cardiac medications before your next appointment, please call your pharmacy*   Lab Work: CBC, BMET, HGBA1C, VIT D TODAY  If you have labs (blood work) drawn today and your tests are completely normal, you will receive your results only by: MyChart Message (if you have MyChart) OR A paper copy in the mail If you have any lab test that is abnormal or we need to change your treatment, we will call you to review the results.   Testing/Procedures:    Follow-Up: At [facility], you and your health needs are our priority.  As part of our continuing mission to provide you with exceptional heart care, we have created designated Provider Care Teams.  These Care Teams include your primary Cardiologist (physician) and Advanced Practice Providers (APPs -  Physician Assistants and Nurse Practitioners) who all work together to provide you with the care you need, when you need it.  We recommend signing up for the patient portal called "MyChart".  Sign up information is provided on this After Visit Summary.  MyChart is used to connect with patients for Virtual Visits (Telemedicine).  Patients are able to view lab/test results, encounter notes, upcoming appointments.   Provider:   Dietrich Pates, MD  in 6 months  Other Instructions

## 2022-10-06 LAB — BASIC METABOLIC PANEL
BUN/Creatinine Ratio: 16 (ref 10–24)
BUN: 15 mg/dL (ref 8–27)
CO2: 23 mmol/L (ref 20–29)
Calcium: 9.5 mg/dL (ref 8.6–10.2)
Chloride: 105 mmol/L (ref 96–106)
Creatinine, Ser: 0.94 mg/dL (ref 0.76–1.27)
Glucose: 98 mg/dL (ref 70–99)
Potassium: 4.9 mmol/L (ref 3.5–5.2)
Sodium: 138 mmol/L (ref 134–144)
eGFR: 88 mL/min/{1.73_m2} (ref >59)

## 2022-10-06 LAB — HEMOGLOBIN A1C
Est. average glucose Bld gHb Est-mCnc: 123 mg/dL
Hgb A1c MFr Bld: 5.9 % — ABNORMAL HIGH (ref 4.8–5.6)

## 2022-10-06 LAB — CBC
Hematocrit: 43.9 % (ref 37.5–51.0)
Hemoglobin: 14.6 g/dL (ref 13.0–17.7)
MCH: 31.6 pg (ref 26.6–33.0)
MCHC: 33.3 g/dL (ref 31.5–35.7)
MCV: 95 fL (ref 79–97)
Platelets: 266 10*3/uL (ref 150–450)
RBC: 4.62 x10E6/uL (ref 4.14–5.80)
RDW: 13 % (ref 11.6–15.4)
WBC: 7.5 10*3/uL (ref 3.4–10.8)

## 2022-10-06 LAB — VITAMIN D 25 HYDROXY (VIT D DEFICIENCY, FRACTURES): Vit D, 25-Hydroxy: 33.8 ng/mL (ref 30.0–100.0)

## 2022-10-08 ENCOUNTER — Telehealth: Payer: Self-pay | Admitting: Internal Medicine

## 2022-10-08 MED ORDER — SM VITAMIN D3 100 MCG (4000 UT) PO CAPS: 4000.0000 [IU] | ORAL_CAPSULE | Freq: Every day | ORAL | 11 refills | Status: DC

## 2022-10-08 MED ORDER — CHOLECALCIFEROL 100 MCG (4000 UT) PO CAPS: 4000.0000 [IU] | ORAL_CAPSULE | Freq: Every day | ORAL | 6 refills | Status: AC

## 2022-10-08 NOTE — Telephone Encounter (Signed)
Patient called to follow-up on test results. 

## 2022-10-08 NOTE — Telephone Encounter (Signed)
Pricilla Riffle, MD 10/07/2022  2:00 PM EDT     Hgb A1C 5.9   Watch/limit sugars/processed carbohydrats Electrolytes and kidney functoin are normal   CBC is normal Vit D is a little low 33.8 I would take 4000 U Vit D per day      The patient has been notified of the result and verbalized understanding.  All questions (if any) were answered.  Pt aware to start taking Vit D3 4000 units per day. Confirmed the pharmacy of choice  with the pt.   Pt verbalized understanding and agrees with this plan.

## 2022-10-13 DIAGNOSIS — H52223 Regular astigmatism, bilateral: Secondary | ICD-10-CM | POA: Diagnosis not present

## 2022-11-16 ENCOUNTER — Emergency Department (HOSPITAL_COMMUNITY): Payer: Medicare HMO

## 2022-11-16 ENCOUNTER — Other Ambulatory Visit: Payer: Self-pay

## 2022-11-16 ENCOUNTER — Encounter (HOSPITAL_COMMUNITY): Payer: Self-pay

## 2022-11-16 ENCOUNTER — Emergency Department (HOSPITAL_COMMUNITY)
Admission: EM | Admit: 2022-11-16 | Discharge: 2022-11-16 | Disposition: A | Discharge status: Home / Self Care | Payer: Medicare HMO | Attending: Emergency Medicine | Admitting: Emergency Medicine

## 2022-11-16 DIAGNOSIS — Z7902 Long term (current) use of antithrombotics/antiplatelets: Secondary | ICD-10-CM | POA: Diagnosis not present

## 2022-11-16 DIAGNOSIS — R0789 Other chest pain: Secondary | ICD-10-CM | POA: Diagnosis not present

## 2022-11-16 DIAGNOSIS — Z7982 Long term (current) use of aspirin: Secondary | ICD-10-CM | POA: Insufficient documentation

## 2022-11-16 DIAGNOSIS — Z79899 Other long term (current) drug therapy: Secondary | ICD-10-CM | POA: Diagnosis not present

## 2022-11-16 DIAGNOSIS — R079 Chest pain, unspecified: Secondary | ICD-10-CM | POA: Insufficient documentation

## 2022-11-16 DIAGNOSIS — I2510 Atherosclerotic heart disease of native coronary artery without angina pectoris: Secondary | ICD-10-CM | POA: Insufficient documentation

## 2022-11-16 DIAGNOSIS — R4182 Altered mental status, unspecified: Secondary | ICD-10-CM | POA: Diagnosis not present

## 2022-11-16 LAB — CBC
HCT: 45.7 % (ref 39.0–52.0)
Hemoglobin: 15.4 g/dL (ref 13.0–17.0)
MCH: 32.1 pg (ref 26.0–34.0)
MCHC: 33.7 g/dL (ref 30.0–36.0)
MCV: 95.2 fL (ref 80.0–100.0)
Platelets: 285 10*3/uL (ref 150–400)
RBC: 4.8 MIL/uL (ref 4.22–5.81)
RDW: 13 % (ref 11.5–15.5)
WBC: 9 10*3/uL (ref 4.0–10.5)
nRBC: 0 % (ref 0.0–0.2)

## 2022-11-16 LAB — TROPONIN I (HIGH SENSITIVITY)
Troponin I (High Sensitivity): 8 ng/L
Troponin I (High Sensitivity): 5 ng/L (ref <18)

## 2022-11-16 LAB — COMPREHENSIVE METABOLIC PANEL
ALT: 39 U/L (ref 0–44)
AST: 27 U/L (ref 15–41)
Albumin: 4.2 g/dL (ref 3.5–5.0)
Alkaline Phosphatase: 62 U/L (ref 38–126)
Anion gap: 13 (ref 5–15)
BUN: 16 mg/dL (ref 8–23)
CO2: 22 mmol/L (ref 22–32)
Calcium: 9.3 mg/dL (ref 8.9–10.3)
Chloride: 104 mmol/L (ref 98–111)
Creatinine, Ser: 0.97 mg/dL (ref 0.61–1.24)
GFR, Estimated: >60 mL/min (ref >60)
Glucose, Bld: 145 mg/dL — ABNORMAL HIGH (ref 70–99)
Potassium: 3.9 mmol/L (ref 3.5–5.1)
Sodium: 139 mmol/L (ref 135–145)
Total Bilirubin: 1 mg/dL (ref 0.3–1.2)
Total Protein: 7 g/dL (ref 6.5–8.1)

## 2022-11-16 NOTE — Telephone Encounter (Signed)
Spoke with pt who gives verbal permission to speak with Raford Pitcher.  [name] reports pt was found unresponsive Saturday evening.  She proceeded to give the pt nitroglycerin tablets x 4 and the pt eventually regained consciousness and was able to verbalize his "heart hurt."  EMS was not called and pt was not taken to the ED.  Pt reports to [name] he is "fine now." Bernerd Limbo that pt should be further evaluated in the ED and not wait any longer.  [name] verbalizes understanding and agrees with RN's recommendation.

## 2022-11-16 NOTE — Discharge Instructions (Addendum)
Evaluation today was overall reassuring.  I do recommend you follow-up with your cardiologist as soon as possible.  If your chest pain returns, you have worsening shortness of breath, altered mental status, or any other concern please return emergency department further evaluation.

## 2022-11-16 NOTE — ED Provider Triage Note (Signed)
Emergency Medicine Provider Triage Evaluation Note  Roger Powers , a 67 y.o. male  was evaluated in triage.  Pt complains of chest pain, shortness of breath.  On Saturday around dinnertime, patient began to have left-sided chest pain as well as shortness of breath.  States that he went to go to that shower when he sat down in his living room.  Wife came around and found him cool, pale and clenching his left chest.  Wife then gave him four nitroglycerin which improved symptoms.  No episodes reported since Saturday.  Reports having residual shortness of breath since incident..  Review of Systems  Positive: See above Negative:   Physical Exam  BP (!) 163/76 (BP Location: Right Arm)   Pulse 80   Temp 98.8 F (37.1 C) (Oral)   Resp 16   Ht 5\' 11"  (1.803 m)   Wt 95.3 kg   SpO2 95%   BMI 29.29 kg/m  Gen:   Awake, no distress   Resp:  Normal effort  MSK:   Moves extremities without difficulty  Other:    Medical Decision Making  Medically screening exam initiated at 1:45 PM.  Appropriate orders placed.  [name] was informed that the remainder of the evaluation will be completed by another provider, this initial triage assessment does not replace that evaluation, and the importance of remaining in the ED until their evaluation is complete.     Peter Garter, [place] 11/16/22 1351

## 2022-11-16 NOTE — ED Triage Notes (Signed)
Pt came in via POV d/t CP he had 3 nights ago. Wife reports she walked in & found him very pale, not speaking to her & she was able to get him to lay on the bed & she gave him 2 Nitroglycerin tabs in about 10 minutes time & he was able to start acting more able to talk but was holding his chest from pain. Pt endorses he was coughing as well, denied any pain radiation & that the pain stayed on the lt side. Wife gave him 2 more nitroglycerin tabs within 45 minutes & pt then was starting to feel normal again. He did not come in when this happened & has arrived today for eval. A/Ox4, denies pain while in triage but does endorse so lingering exertional SOB.

## 2022-11-16 NOTE — ED Provider Notes (Signed)
[place] EMERGENCY DEPARTMENT AT [facility] Provider Note   CSN: [id number] Arrival date & time: 11/16/22  1304     History  Chief Complaint  Patient presents with   Chest Pain   Shortness of Breath   HPI Roger Powers is a 67 y.o. male with CAD, status post heart cath and coronary stent placement last year presenting for chest pain.  Occurred this past Sunday.  Was in the evening and patient started to feel chest pain in the left side of his chest but without radiation.  Endorse associated shortness of breath.  Wife also mentioned that he was very pale at that time not speaking to her.  She was able to get him to lie down in the bed and he took 2 electrocution tabs and then 10 minutes later took 2 more and started to return to his baseline.  Chest pain resolved shortly after the fourth nitro tab.  He has had some lingering shortness of breath that is worse with exertion.  Denies cough and fever.  States today he reached out to his cardiology clinic and was advised to come here for further evaluation.  States that Dr. Dietrich Pates is his cardiologist. Patient also mention that he takes aspirin and Plavix daily as prescribed.   Chest Pain Associated symptoms: shortness of breath   Shortness of Breath Associated symptoms: chest pain        Home Medications Prior to Admission medications   Medication Sig Start Date End Date Taking? Authorizing Provider  aspirin 81 MG tablet Take 81 mg by mouth daily.    [provider]  atorvastatin (LIPITOR) 80 MG tablet Take 1 tablet (80 mg total) by mouth daily. 05/25/22   Pricilla Riffle, MD  Cholecalciferol 100 MCG (4000 UT) CAPS Take 1 capsule (4,000 Units total) by mouth daily. 10/08/22   Pricilla Riffle, MD  clobetasol (TEMOVATE) 0.05 % external solution Apply 1 application  topically daily as needed (psoriasis). 01/30/19   [provider]  clonazePAM (KLONOPIN) 1 MG tablet Take 1 mg by mouth at bedtime as needed for  anxiety. Patient not taking: Reported on 10/05/2022 12/24/21   [provider]  clopidogrel (PLAVIX) 75 MG tablet Take 1 tablet (75 mg total) by mouth daily with breakfast. 05/10/22   Pricilla Riffle, MD  diazepam (VALIUM) 10 MG tablet Take 10 mg by mouth every 8 (eight) hours as needed for anxiety.    [provider]  ezetimibe (ZETIA) 10 MG tablet Take 1 tablet (10 mg total) by mouth daily. 02/11/22   Pricilla Riffle, MD  hydrochlorothiazide (MICROZIDE) 12.5 MG capsule Take 1 capsule (12.5 mg total) by mouth daily. 01/19/22   Pricilla Riffle, MD  lisinopril (ZESTRIL) 40 MG tablet TAKE ONE TABLET (40MG  TOTAL) BY MOUTH DAILY 05/26/22   Pricilla Riffle, MD  metoprolol succinate (TOPROL XL) 25 MG 24 hr tablet Take 1 tablet (25 mg total) by mouth daily. 05/25/22   Pricilla Riffle, MD  Multiple Vitamins-Minerals (MULTIVITAMIN MEN 50+ PO) Take 1 tablet by mouth daily.    [provider]  nitroGLYCERIN (NITROSTAT) 0.4 MG SL tablet Place 1 tablet (0.4 mg total) under the tongue every 5 (five) minutes as needed for chest pain. 05/25/22 05/25/23  Pricilla Riffle, MD  OVER THE COUNTER MEDICATION Take 1 tablet by mouth daily. Dynamic Brain Support    [provider]  OVER THE COUNTER MEDICATION Take 1 tablet by mouth daily. Immune  Support Multivitamin    [provider]  pantoprazole (PROTONIX) 40 MG tablet Take 1 tablet (40 mg total) by mouth daily. 08/30/16   Pricilla Riffle, MD  zolpidem (AMBIEN) 10 MG tablet Take 10 mg by mouth at bedtime as needed for sleep.  04/27/11   [provider]      Allergies    Patient has no known allergies.    Review of Systems   Review of Systems  Respiratory:  Positive for shortness of breath.   Cardiovascular:  Positive for chest pain.    Physical Exam   Vitals:   11/16/22 1308 11/16/22 1641  BP: (!) 163/76 (!) 147/81  Pulse: 80 62  Resp: 16 16  Temp: 98.8 F (37.1 C) 97.9 F (36.6 C)  SpO2: 95% 99%    CONSTITUTIONAL:   well-appearing, NAD NEURO:  Alert and oriented x 3, CN 3-12 grossly intact EYES:  eyes equal and reactive ENT/NECK:  Supple, no stridor  CARDIO:  Regular rate and rhythm, appears well-perfused  PULM:  No respiratory distress, CTAB GI/GU:  non-distended, soft MSK/SPINE:  No gross deformities, no edema, moves all extremities  SKIN:  no rash, atraumatic  *Additional and/or pertinent findings included in MDM below   ED Results / Procedures / Treatments   Labs (all labs ordered are listed, but only abnormal results are displayed) Labs Reviewed  COMPREHENSIVE METABOLIC PANEL - Abnormal; Notable for the following components:      Result Value   Glucose, Bld 145 (*)    All other components within normal limits  CBC  TROPONIN I (HIGH SENSITIVITY)  TROPONIN I (HIGH SENSITIVITY)    EKG EKG Interpretation Date/Time:  Tuesday November 16 2022 12:53:34 EDT Ventricular Rate:  76 PR Interval:  160 QRS Duration:  156 QT Interval:  416 QTC Calculation: 468 R Axis:   6  Text Interpretation: Normal sinus rhythm Right bundle branch block ST-t wave abnormality Abnormal ECG Confirmed by Gerhard Munch [phone]) on 11/16/2022 5:07:21 PM  Radiology CT Head Wo Contrast  Result Date: 11/16/2022 CLINICAL DATA:  Altered mental status. EXAM: CT HEAD WITHOUT CONTRAST TECHNIQUE: Contiguous axial images were obtained from the base of the skull through the vertex without intravenous contrast. RADIATION DOSE REDUCTION: This exam was performed according to the departmental dose-optimization program which includes automated exposure control, adjustment of the mA and/or kV according to patient size and/or use of iterative reconstruction technique. COMPARISON:  Head CT dated 01/30/2020. FINDINGS: Brain: The ventricles and sulci are appropriate size for the patient's age. The gray-white matter discrimination is preserved. There is no acute intracranial hemorrhage. No mass effect or midline shift. No extra-axial fluid  collection. Vascular: No hyperdense vessel or unexpected calcification. Skull: Normal. Negative for fracture or focal lesion. Sinuses/Orbits: No acute finding. Other: None IMPRESSION: No acute intracranial pathology. Electronically Signed   By: Elgie Collard M.D.   On: 11/16/2022 19:39   DG Chest 2 View  Result Date: 11/16/2022 CLINICAL DATA:  Chest pain. EXAM: CHEST - 2 VIEW COMPARISON:  01/30/2020. FINDINGS: Bilateral lung fields are clear. Bilateral costophrenic angles are clear. Stable cardio-mediastinal silhouette. No acute osseous abnormalities. The soft tissues are within normal limits. IMPRESSION: No acute cardiopulmonary process. Electronically Signed   By: Jules Schick M.D.   On: 11/16/2022 15:23    Procedures Procedures    Medications Ordered in ED Medications - No data to display  ED Course/ Medical Decision Making/ A&P  HEART Score: 5                    Medical Decision Making Amount and/or Complexity of Data Reviewed Radiology: ordered.   Initial Impression and Ddx 67 year old well-appearing male presenting for chest pain and altered mental status.  Symptoms occurred this past Sunday and resolved after nitro.  DDx includes ACS, PE, stroke, electrolyte derangement, arrhythmia, syncope or other. Patient PMH that increases complexity of ED encounter: CAD, status post heart cath and coronary stent placement last year   Interpretation of Diagnostics - I independent reviewed and interpreted the labs as followed: No acute derangements  - I independently visualized the following imaging with scope of interpretation limited to determining acute life threatening conditions related to emergency care: CXR and CT head, which revealed no acute findings  -I personally reviewed and interpreted EKG which revealed normal sinus rhythm with RBBB  Patient Reassessment and Ultimate Disposition/Management Workup overall reassuring.  On serial reassessments, patient remained  asymptomatic.  Discussed patient with Dr. Mayford Knife of cardiology who advised that if patient remains chest pain-free would be appropriate for him to follow-up with his cardiologist in outpatient setting.  Patient management required discussion with the following services or consulting groups:  Cardiology  Complexity of Problems Addressed Acute complicated illness or Injury  Additional Data Reviewed and Analyzed Further history obtained from: Past medical history and medications listed in the EMR, Prior ED visit notes, and Recent discharge summary  Patient Encounter Risk Assessment None         Final Clinical Impression(s) / ED Diagnoses Final diagnoses:  Chest pain, unspecified type  Altered mental status, unspecified altered mental status type    Rx / DC Orders ED Discharge Orders     None         Gareth Eagle, PA-C 11/16/22 1945    Gerhard Munch, MD 11/16/22 2248

## 2022-11-21 NOTE — Telephone Encounter (Signed)
Pt seen in ER for CP   Not felt to be cardiac   Sent home  Will need follow up with me

## 2022-11-23 NOTE — Telephone Encounter (Signed)
Pt has OV with Leda Gauze PA on 11/30/22.

## 2022-11-26 NOTE — Progress Notes (Signed)
Cardiology Office Note:  .   Date:  11/30/2022  ID:  Roger Powers, DOB 07-Nov-1955, MRN [id number] PCP: Gareth Morgan, MD  [facility] Providers Cardiologist:  Dietrich Pates, MD    History of Present Illness: .   Roger Powers is a 67 y.o. male with history of CAD, HtN, HLD.  Went to ED 11/16/22 with chest pain shortness of breath and altered mental status relieved with 4 NTG.work up unremarkable.  Patient comes in with his partner Roger Powers. On a Saturday he was helping her cook and then went into turn the shower on. She found him slumped over and he couldn't communicate or talk. She says he was holding his chest, coughing and she gave him 4 NTG. She called here on Tues and advised to go to ED because he was still feeling some heaviness. He can't remember any of it. No chest pain since. 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   ROS:    Studies Reviewed: Marland Kitchen         Prior CV Studies:    Echo  Jan 2024   1. Left ventricular ejection fraction, by estimation, is 65 to 70%. The  left ventricle has normal function. The left ventricle has no regional  wall motion abnormalities. There is moderate asymmetric left ventricular  hypertrophy of the basal-septal  segment. Left ventricular diastolic parameters are indeterminate. Elevated  left atrial pressure. The average left ventricular global longitudinal  strain is -19.9 %. The global longitudinal strain is normal.   2. Right ventricular systolic function is normal. The right ventricular  size is normal.   3. The mitral valve is normal in structure. No evidence of mitral valve  regurgitation. No evidence of mitral stenosis.   4. The aortic valve is tricuspid. There is mild calcification of the  aortic valve. There is mild thickening of the aortic valve. Aortic valve  regurgitation is not visualized. No aortic stenosis is present.   5. The inferior vena cava is normal in size with greater than 50%   respiratory variability, suggesting right atrial pressure of 3 mmHg.      LHC   Dec 2023     Prox RCA lesion is 25% stenosed.   RPAV lesion is 50% stenosed.   Mid LAD-1 lesion is 25% stenosed.   Mid LAD-2 lesion is 40% stenosed.   Mid LAD-3 lesion is 75% stenosed.  A drug-eluting stent was successfully placed using a SYNERGY XD 2.50X16, postdilated to greater than 2.75 mm.   Post intervention, there is a 0% residual stenosis.   The left ventricular systolic function is normal.   LV end diastolic pressure is normal.  LVEDP 8 mmHg.   The left ventricular ejection fraction is 55-65% by visual estimate.   There is no aortic valve stenosis.   There was tortuosity in the right subclavian making catheter manipulation somewhat difficult.  There was right radial spasm.   Successful PCI of the mid LAD.  Mild to moderate disease elsewhere in the coronary tree.  Continue aggressive preventive therapy along with dual antiplatelet therapy for at least 6 months.  After 6 months, would consider clopidogrel monotherapy going forward given his diffuse atherosclerosis and calcification.   CT Dec 2023    Coronary calcium score is 847, which places the patient in the 85th percentile for age and sex matched control.   Coronary arteries: Normal coronary origins.  Right dominance.   Right Coronary Artery: Severe  mixed atherosclerotic plaque in the proximal RCA, 70-99% stenosis. Mild mixed atherosclerotic plaque in the mid RCA with low attenuation plaque, 25-49% stenosis. Mild scattered plaque in the distal RCA, PDA and PL, 25-49% stenosis.   Left Main Coronary Artery: Minimal distal LM mixed atherosclerotic plaque, <25% stenosis.   Left Anterior Descending Coronary Artery: Mild mixed atherosclerotic plaque in the proximal LAD, 25-49% stenosis. Moderate mixed atherosclerotic plaque in the mid LAD at the level of the D1 bifurcation, 50-69% stenosis. Minimal ostial plaque in D1, <25% stenosis. Severe  mixed atherosclerotic plaque in the mid LAD just distal to the D1 takeoff, 70-99% stenosis, and a serial severe stenosis 70-99% stenosis in the mid LAD. Distal vessel has mild scattered disease.   Ramus intermedius: Mild mixed atherosclerotic plaque in the mid RI, 25-49%.   Left Circumflex Artery: Mild mixed atherosclerotic plaque in the proximal LCx, 25-49%.   Aorta: Mild dilation, 42 x 40 x 39 mm at sinus of Valsalva, 40 mm at the mid ascending aorta (level of the PA bifurcation) measured double oblique.   Aortic Valve: No calcifications.   Other findings:   Normal pulmonary vein drainage into the left atrium.   Normal left atrial appendage without thrombus.   Mild dilation of main and branch pulmonary arteries   Severe basal septal hypertrophy, 16-17 mm.   Please see separate report from [facility] for non-cardiac findings.   IMPRESSION: 1. Severe CAD in the proximal RCA and mid LAD, 70-99% stenosis, CADRADS 4. CT FFR will be performed and reported separately.   2. Coronary calcium score is 847, which places the patient in the 85th percentile for age and sex matched control.   3. Normal coronary origins with right dominance.   4. Severe basal septal hypertrophy, 16-17 mm.   5. Mild dilation of main and branch pulmonary arteries, which may indicate increased pulmonary pressures.   6. Mild dilation of ascending aorta, 42 mm at sinus of Valsalva, 40 mm at mid ascending aorta.    Risk Assessment/Calculations:             Physical Exam:   VS:  BP 120/70   Pulse 67   Ht 5\' 11"  (1.803 m)   Wt 221 lb 9.6 oz (100.5 kg)   SpO2 96%   BMI 30.91 kg/m    Wt Readings from Last 3 Encounters:  11/30/22 221 lb 9.6 oz (100.5 kg)  11/16/22 210 lb (95.3 kg)  10/05/22 224 lb 3.2 oz (101.7 kg)    GEN: Well nourished, well developed in no acute distress NECK: No JVD; No carotid bruits CARDIAC:  RRR, no murmurs, rubs, gallops RESPIRATORY:  Clear to auscultation  without rales, wheezing or rhonchi  ABDOMEN: Soft, non-tender, non-distended EXTREMITIES:  No edema; No deformity   ASSESSMENT AND PLAN: .    Unresponsive episode CT head unremarkable, EKG unchanged, labs unremarkable except glucose 145, A1C was 5.9 in Aug. Will place 2 week Zio and order lexiscan myoview in [place]. Carotids ok in Jan. Will refer to neurologist  Chest pain with unresponsive episode -no chest pain since. ? Possible coronary spasm? Awake results of stress tes.   CAD DES mLAD 01/2022 residual disease treated medically-see above, consider amlodipine in future   HTN-BP controlled  HLD-LDL 41 04/2022  Dilated Aorta 42 mm on CT 01/2022     Informed Consent   Shared Decision Making/Informed Consent The risks [chest pain, shortness of breath, cardiac arrhythmias, dizziness, blood pressure fluctuations, myocardial infarction, stroke/transient ischemic attack, nausea, vomiting,  allergic reaction, radiation exposure, metallic taste sensation and life-threatening complications (estimated to be 1 in 10,000)], benefits (risk stratification, diagnosing coronary artery disease, treatment guidance) and alternatives of a nuclear stress test were discussed in detail with Mr. Henton and he agrees to proceed.     Dispo: f/u with Dr. Tenny Craw in [place] in 3-4 months or sooner pending testing.  Signed, Jacolyn Reedy, PA-C

## 2022-11-30 ENCOUNTER — Other Ambulatory Visit: Payer: Self-pay | Admitting: Physician Assistant

## 2022-11-30 ENCOUNTER — Encounter: Payer: Self-pay | Admitting: Physician Assistant

## 2022-11-30 ENCOUNTER — Ambulatory Visit: Payer: Medicare HMO | Attending: Physician Assistant | Admitting: Physician Assistant

## 2022-11-30 ENCOUNTER — Ambulatory Visit (INDEPENDENT_AMBULATORY_CARE_PROVIDER_SITE_OTHER): Payer: Medicare HMO

## 2022-11-30 VITALS — BP 120/70 | HR 67 | Ht 71.0 in | Wt 221.6 lb

## 2022-11-30 DIAGNOSIS — R404 Transient alteration of awareness: Secondary | ICD-10-CM

## 2022-11-30 DIAGNOSIS — I7789 Other specified disorders of arteries and arterioles: Secondary | ICD-10-CM | POA: Diagnosis not present

## 2022-11-30 DIAGNOSIS — I259 Chronic ischemic heart disease, unspecified: Secondary | ICD-10-CM | POA: Diagnosis not present

## 2022-11-30 DIAGNOSIS — I4510 Unspecified right bundle-branch block: Secondary | ICD-10-CM

## 2022-11-30 DIAGNOSIS — I10 Essential (primary) hypertension: Secondary | ICD-10-CM | POA: Diagnosis not present

## 2022-11-30 DIAGNOSIS — I2510 Atherosclerotic heart disease of native coronary artery without angina pectoris: Secondary | ICD-10-CM

## 2022-11-30 DIAGNOSIS — E785 Hyperlipidemia, unspecified: Secondary | ICD-10-CM | POA: Diagnosis not present

## 2022-11-30 DIAGNOSIS — R0602 Shortness of breath: Secondary | ICD-10-CM

## 2022-11-30 MED ORDER — EZETIMIBE 10 MG PO TABS: 10.0000 mg | ORAL_TABLET | Freq: Every day | ORAL | 3 refills | Status: DC

## 2022-11-30 MED ORDER — HYDROCHLOROTHIAZIDE 12.5 MG PO CAPS: 12.5000 mg | ORAL_CAPSULE | Freq: Every day | ORAL | 3 refills | Status: DC

## 2022-11-30 NOTE — Patient Instructions (Addendum)
Medication Instructions:  Your physician recommends that you continue on your current medications as directed. Please refer to the Current Medication list given to you today.  *If you need a refill on your cardiac medications before your next appointment, please call your pharmacy*   Lab Work: None ordered   If you have labs (blood work) drawn today and your tests are completely normal, you will receive your results only by: MyChart Message (if you have MyChart) OR A paper copy in the mail If you have any lab test that is abnormal or we need to change your treatment, we will call you to review the results.   Testing/Procedures: Your physician has requested that you have a lexiscan myoview in [place]. For further information please visit [email or web address] Please follow instruction sheet, as given.  Your physician has referred you to [facility]. Someone will contact you with an appointment   A zio monitor was ordered today. It will remain on for 14 days. You will then return monitor and event diary in provided box. It takes 1-2 weeks for report to be downloaded and returned to [place]. We will call you with the results. If monitor falls off or has orange flashing light, please call Zio for further instructions.    Follow-Up: At [facility], you and your health needs are our priority.  As part of our continuing mission to provide you with exceptional heart care, we have created designated Provider Care Teams.  These Care Teams include your primary Cardiologist (physician) and Advanced Practice Providers (APPs -  Physician Assistants and Nurse Practitioners) who all work together to provide you with the care you need, when you need it.  We recommend signing up for the patient portal called "MyChart".  Sign up information is provided on this After Visit Summary.  MyChart is used to connect with patients for Virtual Visits (Telemedicine).  Patients are able to view lab/test  results, encounter notes, upcoming appointments, etc.  Non-urgent messages can be sent to your provider as well.   To learn more about what you can do with MyChart, go to [email or web address].    Your next appointment:   3-4 month(s)  Provider:   You may see Dietrich Pates, MD or one of the following Advanced Practice Providers on your designated Care Team:   Randall An, PA-C  Jacolyn Reedy, PA-C     Other Instructions Christena Deem- Long Term Monitor Instructions  Your physician has requested you wear a ZIO patch monitor for 14 days.  This is a single patch monitor. [employer] supplies one patch monitor per enrollment. Additional stickers are not available. Please do not apply patch if you will be having a Nuclear Stress Test,  Echocardiogram, Cardiac CT, MRI, or Chest Xray during the period you would be wearing the  monitor. The patch cannot be worn during these tests. You cannot remove and re-apply the  ZIO XT patch monitor.  Your ZIO patch monitor will be mailed 3 day USPS to your address on file. It may take 3-5 days  to receive your monitor after you have been enrolled.  Once you have received your monitor, please review the enclosed instructions. Your monitor  has already been registered assigning a specific monitor serial # to you.  Billing and Patient Assistance Program Information  We have supplied [employer] with any of your insurance information on file for billing purposes. [employer] offers a sliding scale Patient Assistance Program for patients that do not have  insurance, or whose insurance  does not completely cover the cost of the ZIO monitor.  You must apply for the Patient Assistance Program to qualify for this discounted rate.  To apply, please call [employer] at [phone], select option 4, select option 2, ask to apply for  Patient Assistance Program. Meredeth Ide will ask your household income, and how many people  are in your household. They will quote your out-of-pocket cost  based on that information.  [employer] will also be able to set up a 61-month, interest-free payment plan if needed.  Applying the monitor   Shave hair from upper left chest.  Hold abrader disc by orange tab. Rub abrader in 40 strokes over the upper left chest as  indicated in your monitor instructions.  Clean area with 4 enclosed alcohol pads. Let dry.  Apply patch as indicated in monitor instructions. Patch will be placed under collarbone on left  side of chest with arrow pointing upward.  Rub patch adhesive wings for 2 minutes. Remove white label marked "1". Remove the white  label marked "2". Rub patch adhesive wings for 2 additional minutes.  While looking in a mirror, press and release button in center of patch. A small green light will  flash 3-4 times. This will be your only indicator that the monitor has been turned on.  Do not shower for the first 24 hours. You may shower after the first 24 hours.  Press the button if you feel a symptom. You will hear a small click. Record Date, Time and  Symptom in the Patient Logbook.  When you are ready to remove the patch, follow instructions on the last 2 pages of Patient  Logbook. Stick patch monitor onto the last page of Patient Logbook.  Place Patient Logbook in the blue and white box. Use locking tab on box and tape box closed  securely. The blue and white box has prepaid postage on it. Please place it in the mailbox as  soon as possible. Your physician should have your test results approximately 7 days after the  monitor has been mailed back to [facility].  Call [facility] Customer Care at [phone] if you have questions regarding  your ZIO XT patch monitor. Call them immediately if you see an orange light blinking on your  monitor.  If your monitor falls off in less than 4 days, contact our Monitor department at [phone].  If your monitor becomes loose or falls off after 4 days call [employer] at [phone] for   suggestions on securing your monitor

## 2022-11-30 NOTE — Progress Notes (Unsigned)
Enrolled for [employer] to mail a ZIO XT long term holter monitor to the patients address on file.   Dr. Tenny Craw to read.

## 2022-12-07 DIAGNOSIS — R0602 Shortness of breath: Secondary | ICD-10-CM

## 2022-12-07 DIAGNOSIS — I4510 Unspecified right bundle-branch block: Secondary | ICD-10-CM | POA: Diagnosis not present

## 2022-12-07 DIAGNOSIS — R404 Transient alteration of awareness: Secondary | ICD-10-CM | POA: Diagnosis not present

## 2022-12-07 DIAGNOSIS — I259 Chronic ischemic heart disease, unspecified: Secondary | ICD-10-CM

## 2022-12-23 DIAGNOSIS — R404 Transient alteration of awareness: Secondary | ICD-10-CM | POA: Diagnosis not present

## 2022-12-29 ENCOUNTER — Encounter (HOSPITAL_COMMUNITY): Payer: Self-pay

## 2022-12-29 ENCOUNTER — Ambulatory Visit (HOSPITAL_COMMUNITY)
Admission: RE | Admit: 2022-12-29 | Discharge: 2022-12-29 | Disposition: A | Discharge status: Home / Self Care | Payer: Medicare HMO | Source: Ambulatory Visit | Attending: Physician Assistant | Admitting: Physician Assistant

## 2022-12-29 ENCOUNTER — Encounter (HOSPITAL_BASED_OUTPATIENT_CLINIC_OR_DEPARTMENT_OTHER)
Admission: RE | Admit: 2022-12-29 | Discharge: 2022-12-29 | Disposition: A | Discharge status: Home / Self Care | Payer: Medicare HMO | Source: Ambulatory Visit | Attending: Physician Assistant | Admitting: Physician Assistant

## 2022-12-29 DIAGNOSIS — I259 Chronic ischemic heart disease, unspecified: Secondary | ICD-10-CM | POA: Insufficient documentation

## 2022-12-29 LAB — NM MYOCAR MULTI W/SPECT W/WALL MOTION / EF
LV dias vol: 104 mL (ref 62–150)
LV sys vol: 43 mL
Nuc Stress EF: 58 %
Peak HR: 82 {beats}/min
RATE: 0.3
Rest HR: 59 {beats}/min
Rest Nuclear Isotope Dose: 10.8 mCi
SDS: 2
SRS: 1
SSS: 3
ST Depression (mm): 0 mm
Stress Nuclear Isotope Dose: 31 mCi
TID: 1.13

## 2022-12-29 MED ORDER — SODIUM CHLORIDE FLUSH 0.9 % IV SOLN
INTRAVENOUS | Status: AC
  Administered 2022-12-29: 10 mL via INTRAVENOUS
  Filled 2022-12-29: qty 10

## 2022-12-29 MED ORDER — REGADENOSON 0.4 MG/5ML IV SOLN
INTRAVENOUS | Status: AC
  Administered 2022-12-29: 0.4 mg via INTRAVENOUS
  Filled 2022-12-29: qty 5

## 2022-12-29 MED ORDER — TECHNETIUM TC 99M TETROFOSMIN IV KIT
30.0000 | PACK | Freq: Once | INTRAVENOUS | Status: AC | PRN
  Administered 2022-12-29: 31 via INTRAVENOUS

## 2022-12-29 MED ORDER — TECHNETIUM TC 99M TETROFOSMIN IV KIT
10.0000 | PACK | Freq: Once | INTRAVENOUS | Status: AC | PRN
  Administered 2022-12-29: 10.8 via INTRAVENOUS

## 2023-01-04 ENCOUNTER — Ambulatory Visit (INDEPENDENT_AMBULATORY_CARE_PROVIDER_SITE_OTHER): Payer: Medicare HMO | Admitting: Internal Medicine

## 2023-01-04 ENCOUNTER — Telehealth: Payer: Self-pay | Admitting: Internal Medicine

## 2023-01-04 ENCOUNTER — Encounter: Payer: Self-pay | Admitting: Internal Medicine

## 2023-01-04 ENCOUNTER — Other Ambulatory Visit: Payer: Self-pay | Admitting: Internal Medicine

## 2023-01-04 VITALS — BP 148/76 | HR 64 | Resp 16 | Ht 71.0 in | Wt 227.6 lb

## 2023-01-04 DIAGNOSIS — F5104 Psychophysiologic insomnia: Secondary | ICD-10-CM | POA: Diagnosis not present

## 2023-01-04 DIAGNOSIS — Z79899 Other long term (current) drug therapy: Secondary | ICD-10-CM

## 2023-01-04 DIAGNOSIS — N529 Male erectile dysfunction, unspecified: Secondary | ICD-10-CM | POA: Insufficient documentation

## 2023-01-04 DIAGNOSIS — I10 Essential (primary) hypertension: Secondary | ICD-10-CM | POA: Diagnosis not present

## 2023-01-04 DIAGNOSIS — I2510 Atherosclerotic heart disease of native coronary artery without angina pectoris: Secondary | ICD-10-CM | POA: Diagnosis not present

## 2023-01-04 DIAGNOSIS — E785 Hyperlipidemia, unspecified: Secondary | ICD-10-CM

## 2023-01-04 DIAGNOSIS — F411 Generalized anxiety disorder: Secondary | ICD-10-CM | POA: Insufficient documentation

## 2023-01-04 DIAGNOSIS — N528 Other male erectile dysfunction: Secondary | ICD-10-CM

## 2023-01-04 DIAGNOSIS — G4700 Insomnia, unspecified: Secondary | ICD-10-CM | POA: Insufficient documentation

## 2023-01-04 DIAGNOSIS — K219 Gastro-esophageal reflux disease without esophagitis: Secondary | ICD-10-CM | POA: Insufficient documentation

## 2023-01-04 DIAGNOSIS — R7303 Prediabetes: Secondary | ICD-10-CM | POA: Insufficient documentation

## 2023-01-04 MED ORDER — CLOBETASOL PROPIONATE 0.05 % EX SOLN: 1.0000 | Freq: Every day | CUTANEOUS | 1 refills | Status: AC | PRN

## 2023-01-04 NOTE — Patient Instructions (Signed)
It was a pleasure to see you today.  Thank you for giving [place] the opportunity to be involved in your care.  Below is a brief recap of your visit and next steps.  We will plan to see you again in 6 months.  Summary You have established care today No medication changes were made No labs ordered Follow up in 6 months

## 2023-01-04 NOTE — Assessment & Plan Note (Signed)
History of CAD s/p PCI with DES to LAD in December 2023.  Currently prescribed ASA 81 mg daily, atorvastatin 80 mg daily, and Toprol-XL 25 mg daily.  Denies recent chest pain.  Cardiology follow-up is scheduled for January 2025.

## 2023-01-04 NOTE — Assessment & Plan Note (Signed)
He is prescribed Ambien 10 mg nightly as needed for insomnia.  Reports not needing to take every night.  PDMP reviewed and is appropriate. -Controlled substance agreement and UDS are pending

## 2023-01-04 NOTE — Assessment & Plan Note (Signed)
Adequately controlled with atorvastatin 80 mg daily and Zetia 10 mg daily.  No medication changes are indicated today.

## 2023-01-04 NOTE — Assessment & Plan Note (Signed)
A1c 5.9 on labs from August.  Last on modifications aimed at weight loss and improving his blood sugar were reviewed today.

## 2023-01-04 NOTE — Progress Notes (Signed)
New Patient Office Visit  Subjective    Patient ID: Roger Powers, male    DOB: Aug 01, 1955  Age: 67 y.o. MRN: [id number]  CC:  Chief Complaint  Patient presents with   Establish Care   HPI Roger Powers presents to establish care.  He is a 67 year old male who endorses a past medical history significant for CAD, HTN, HLD, psoriasis, anxiety, insomnia, GERD, and prediabetes.  Previously followed by Dr. Sudie Bailey.  Roger Powers reports feeling well today.  He is asymptomatic and has no acute concerns to discuss aside from desiring to establish care.  He currently works in Research officer, political party.  He endorses social alcohol consumption but denies tobacco and illicit drug use.  His feeling medical history is significant for prostate cancer and CAD.  Chronic medical conditions and outstanding preventative care items discussed today are individually addressed A/P below.  Outpatient Encounter Medications as of 01/04/2023  Medication Sig   aspirin 81 MG tablet Take 81 mg by mouth daily.   atorvastatin (LIPITOR) 80 MG tablet Take 1 tablet (80 mg total) by mouth daily.   Cholecalciferol 100 MCG (4000 UT) CAPS Take 1 capsule (4,000 Units total) by mouth daily.   clobetasol (TEMOVATE) 0.05 % external solution Apply 1 application  topically daily as needed (psoriasis).   clonazePAM (KLONOPIN) 1 MG tablet Take 1 mg by mouth at bedtime as needed for anxiety.   clopidogrel (PLAVIX) 75 MG tablet Take 1 tablet (75 mg total) by mouth daily with breakfast.   diazepam (VALIUM) 10 MG tablet Take 10 mg by mouth every 8 (eight) hours as needed for anxiety.   ezetimibe (ZETIA) 10 MG tablet Take 1 tablet (10 mg total) by mouth daily.   hydrochlorothiazide (MICROZIDE) 12.5 MG capsule Take 1 capsule (12.5 mg total) by mouth daily.   lisinopril (ZESTRIL) 40 MG tablet TAKE ONE TABLET (40MG  TOTAL) BY MOUTH DAILY   metoprolol succinate (TOPROL XL) 25 MG 24 hr tablet Take 1 tablet (25 mg total) by mouth daily.   Multiple Vitamins-Minerals  (MULTIVITAMIN MEN 50+ PO) Take 1 tablet by mouth daily.   nitroGLYCERIN (NITROSTAT) 0.4 MG SL tablet Place 1 tablet (0.4 mg total) under the tongue every 5 (five) minutes as needed for chest pain.   OVER THE COUNTER MEDICATION Take 1 tablet by mouth daily. Dynamic Brain Support   OVER THE COUNTER MEDICATION Take 1 tablet by mouth daily. Immune Support Multivitamin   pantoprazole (PROTONIX) 40 MG tablet Take 1 tablet (40 mg total) by mouth daily.   tadalafil (CIALIS) 20 MG tablet Take 20 mg by mouth daily as needed for erectile dysfunction.   zolpidem (AMBIEN) 10 MG tablet Take 10 mg by mouth at bedtime as needed for sleep.    No facility-administered encounter medications on file as of 01/04/2023.    Past Medical History:  Diagnosis Date   CAD (coronary artery disease)    02/12/22: LHC DES 2.50x16 to midLAD, residual dz tx medically   Cataract    RIGHT eye only   GERD (gastroesophageal reflux disease)    hx of- with certain foods- on meds   Hypercholesteremia    on meds   Hypertension    on meds   Psoriasis     Past Surgical History:  Procedure Laterality Date   CATARACT EXTRACTION W/ INTRAOCULAR LENS IMPLANT Right 2013   COLONOSCOPY  2004   patient cannot remember where/MD   CORONARY STENT INTERVENTION N/A 02/12/2022   Procedure: CORONARY STENT INTERVENTION;  Surgeon:  Corky Crafts, MD;  [facility] INVASIVE CV LAB;  Service: Cardiovascular;  Laterality: N/A;   LEFT HEART CATH AND CORONARY ANGIOGRAPHY N/A 02/12/2022   Procedure: LEFT HEART CATH AND CORONARY ANGIOGRAPHY;  Surgeon: Corky Crafts, MD;  Location: [facility] INVASIVE CV LAB;  Service: Cardiovascular;  Laterality: N/A;   RETINAL DETACHMENT SURGERY Right 2012   WISDOM TOOTH EXTRACTION      Family History  Problem Relation Age of Onset   Breast cancer Mother    Diabetes Mother    Lung cancer Mother    Heart disease Father    Heart disease Brother    Heart disease Brother    Colon polyps Neg Hx    Colon  cancer Neg Hx    Esophageal cancer Neg Hx    Stomach cancer Neg Hx    Rectal cancer Neg Hx     Social History   Socioeconomic History   Marital status: Divorced    Spouse name: Not on file   Number of children: Not on file   Years of education: Not on file   Highest education level: Not on file  Occupational History   Not on file  Tobacco Use   Smoking status: Former    Current packs/day: 0.00    Types: Cigarettes    Quit date: 11/18/1979    Years since quitting: 43.1   Smokeless tobacco: Former  Building services engineer status: Never Used  Substance and Sexual Activity   Alcohol use: Yes    Alcohol/week: 2.0 standard drinks of alcohol    Types: 2 Standard drinks or equivalent per week    Comment: rare   Drug use: No   Sexual activity: Not on file  Other Topics Concern   Not on file  Social History Narrative   Not on file   Social Determinants of Health   Financial Resource Strain: Not on file  Food Insecurity: Not on file  Transportation Needs: Not on file  Physical Activity: Not on file  Stress: Not on file  Social Connections: Not on file  Intimate Partner Violence: Not on file   Review of Systems  Constitutional:  Negative for chills and fever.  HENT:  Negative for sore throat.   Respiratory:  Negative for cough and shortness of breath.   Cardiovascular:  Negative for chest pain, palpitations and leg swelling.  Gastrointestinal:  Negative for abdominal pain, blood in stool, constipation, diarrhea, nausea and vomiting.  Genitourinary:  Negative for dysuria and hematuria.  Musculoskeletal:  Negative for myalgias.  Skin:  Negative for itching and rash.  Neurological:  Negative for dizziness and headaches.  Psychiatric/Behavioral:  Negative for depression and suicidal ideas.    Objective    BP (!) 148/76   Pulse 64   Resp 16   Ht 5\' 11"  (1.803 m)   Wt 227 lb 9.6 oz (103.2 kg)   SpO2 92%   BMI 31.74 kg/m   Physical Exam Vitals reviewed.   Constitutional:      General: He is not in acute distress.    Appearance: Normal appearance. He is obese. He is not ill-appearing.  HENT:     Head: Normocephalic and atraumatic.     Right Ear: External ear normal.     Left Ear: External ear normal.     Nose: Nose normal. No congestion or rhinorrhea.     Mouth/Throat:     Mouth: Mucous membranes are moist.     Pharynx: Oropharynx is clear.  Eyes:     General: No scleral icterus.    Extraocular Movements: Extraocular movements intact.     Conjunctiva/sclera: Conjunctivae normal.     Pupils: Pupils are equal, round, and reactive to light.  Cardiovascular:     Rate and Rhythm: Normal rate and regular rhythm.     Pulses: Normal pulses.     Heart sounds: Normal heart sounds. No murmur heard. Pulmonary:     Effort: Pulmonary effort is normal.     Breath sounds: Normal breath sounds. No wheezing, rhonchi or rales.  Abdominal:     General: Abdomen is flat. Bowel sounds are normal. There is no distension.     Palpations: Abdomen is soft.     Tenderness: There is no abdominal tenderness.  Musculoskeletal:        General: No swelling or deformity. Normal range of motion.     Cervical back: Normal range of motion.  Skin:    General: Skin is warm and dry.     Capillary Refill: Capillary refill takes less than 2 seconds.  Neurological:     General: No focal deficit present.     Mental Status: He is alert and oriented to person, place, and time.     Motor: No weakness.  Psychiatric:        Mood and Affect: Mood normal.        Behavior: Behavior normal.        Thought Content: Thought content normal.    Assessment & Plan:   Problem List Items Addressed This Visit       Hypertension    BP is mildly elevated today.  He is currently prescribed lisinopril 40 mg daily and Toprol-XL 25 mg daily.  Previously prescribed HCTZ 12.5 mg daily but this was recently discontinued as he believed it was causing hypotension. -No medication changes  were made today.  Patient prefers to follow-up with cardiology in early January for reassessment.      CAD (coronary artery disease)    History of CAD s/p PCI with DES to LAD in December 2023.  Currently prescribed ASA 81 mg daily, atorvastatin 80 mg daily, and Toprol-XL 25 mg daily.  Denies recent chest pain.  Cardiology follow-up is scheduled for January 2025.      GERD (gastroesophageal reflux disease)    Symptoms are adequately controlled with Protonix 40 mg daily.      Hyperlipidemia with target LDL less than 70    Adequately controlled with atorvastatin 80 mg daily and Zetia 10 mg daily.  No medication changes are indicated today.      Prediabetes    A1c 5.9 on labs from August.  Last on modifications aimed at weight loss and improving his blood sugar were reviewed today.      Erectile dysfunction    Symptoms are adequately controlled with as needed use of tadalafil.      Generalized anxiety disorder    Currently prescribed clonazepam 1 mg as needed for anxiety relief.  This was managed by his previous PCP.  PDMP reviewed and is appropriate. -No medication changes were made today.  Controlled substance agreement and UDS are pending.      Insomnia    He is prescribed Ambien 10 mg nightly as needed for insomnia.  Reports not needing to take every night.  PDMP reviewed and is appropriate. -Controlled substance agreement and UDS are pending      Return in about 6 months (around 07/04/2023).   Billie Lade, MD

## 2023-01-04 NOTE — Assessment & Plan Note (Signed)
Symptoms are adequately controlled with as needed use of tadalafil.

## 2023-01-04 NOTE — Telephone Encounter (Signed)
Patient came by the office needs a refill was seen this morning, no longer get from old pcp retired.  Clobetasol Proponate 0.05% topical solution  Pharmcay: [facility] [employer]

## 2023-01-04 NOTE — Assessment & Plan Note (Signed)
BP is mildly elevated today.  He is currently prescribed lisinopril 40 mg daily and Toprol-XL 25 mg daily.  Previously prescribed HCTZ 12.5 mg daily but this was recently discontinued as he believed it was causing hypotension. -No medication changes were made today.  Patient prefers to follow-up with cardiology in early January for reassessment.

## 2023-01-04 NOTE — Assessment & Plan Note (Signed)
Symptoms are adequately controlled with Protonix 40 mg daily.

## 2023-01-04 NOTE — Assessment & Plan Note (Signed)
Currently prescribed clonazepam 1 mg as needed for anxiety relief.  This was managed by his previous PCP.  PDMP reviewed and is appropriate. -No medication changes were made today.  Controlled substance agreement and UDS are pending.

## 2023-01-06 ENCOUNTER — Ambulatory Visit: Payer: Medicare HMO | Admitting: Internal Medicine

## 2023-01-07 LAB — TOXASSURE SELECT 13 (MW), URINE

## 2023-01-25 DIAGNOSIS — R051 Acute cough: Secondary | ICD-10-CM | POA: Diagnosis not present

## 2023-01-25 DIAGNOSIS — J019 Acute sinusitis, unspecified: Secondary | ICD-10-CM | POA: Diagnosis not present

## 2023-01-25 DIAGNOSIS — J22 Unspecified acute lower respiratory infection: Secondary | ICD-10-CM | POA: Diagnosis not present

## 2023-01-31 DIAGNOSIS — J209 Acute bronchitis, unspecified: Secondary | ICD-10-CM | POA: Diagnosis not present

## 2023-01-31 DIAGNOSIS — J101 Influenza due to other identified influenza virus with other respiratory manifestations: Secondary | ICD-10-CM | POA: Diagnosis not present

## 2023-01-31 DIAGNOSIS — J189 Pneumonia, unspecified organism: Secondary | ICD-10-CM | POA: Diagnosis not present

## 2023-01-31 DIAGNOSIS — J9801 Acute bronchospasm: Secondary | ICD-10-CM | POA: Diagnosis not present

## 2023-01-31 DIAGNOSIS — U071 COVID-19: Secondary | ICD-10-CM | POA: Diagnosis not present

## 2023-03-02 ENCOUNTER — Encounter: Payer: Self-pay | Admitting: Student

## 2023-03-02 ENCOUNTER — Ambulatory Visit: Payer: Medicare HMO | Attending: Student | Admitting: Student

## 2023-03-02 VITALS — BP 130/78 | HR 70 | Ht 71.0 in | Wt 226.4 lb

## 2023-03-02 DIAGNOSIS — E785 Hyperlipidemia, unspecified: Secondary | ICD-10-CM

## 2023-03-02 DIAGNOSIS — I2510 Atherosclerotic heart disease of native coronary artery without angina pectoris: Secondary | ICD-10-CM

## 2023-03-02 DIAGNOSIS — I7121 Aneurysm of the ascending aorta, without rupture: Secondary | ICD-10-CM

## 2023-03-02 DIAGNOSIS — I10 Essential (primary) hypertension: Secondary | ICD-10-CM | POA: Diagnosis not present

## 2023-03-02 NOTE — Progress Notes (Signed)
 Cardiology Office Note    Date:  03/02/2023  ID:  Roger Powers, DOB 12-Jan-1956, MRN [id number] Cardiologist: Ola Berger, MD    History of Present Illness:    Roger Powers is a 68 y.o. male with past medical history of CAD (s/p DES to mid LAD in 01/2022), aortic dilatation, HTN, and HLD who presents to the office today for follow-up from his recent stress test.  He was examined by Theotis Flake, PA in 11/2022 and reported an episode of chest pain and unresponsiveness with recent CT Head showing no acute intracranial abnormalities. A 2-week ZIO monitor and Lexiscan  Myoview  were recommended for further assessment. His monitor showed predominantly normal sinus rhythm with rare PAC's and PVC's with 2 brief episodes of SVT with the longest being for 7 beats. Stress test showed a small, mild intensity, mid to basal inferior lateral defect which was partially reversible but also noted to have diaphragmatic attenuation but was a low-risk study. Dr. Avanell Bob reviewed the images and she was not convinced this was consistent with ischemia and medical therapy was recommended.  In talking with the patient today, he reports overall doing well since his last office visit. He denies any recurrent episodes of unresponsiveness. No recent chest pain or progressive dyspnea on exertion. No specific orthopnea, PND, pitting edema or palpitations. He has started back exercising at home and is hopeful to gradually increase his activity. Does report some shortness of breath with this which he feels is due to weight gain over the past several months and he also recently had bronchitis in 01/2023.  Studies Reviewed:   EKG: EKG is not ordered today.  Cardiac Catheterization: 01/2022   Prox RCA lesion is 25% stenosed.   RPAV lesion is 50% stenosed.   Mid LAD-1 lesion is 25% stenosed.   Mid LAD-2 lesion is 40% stenosed.   Mid LAD-3 lesion is 75% stenosed.  A drug-eluting stent was successfully placed using a SYNERGY XD 2.50X16,  postdilated to greater than 2.75 mm.   Post intervention, there is a 0% residual stenosis.   The left ventricular systolic function is normal.   LV end diastolic pressure is normal.  LVEDP 8 mmHg.   The left ventricular ejection fraction is 55-65% by visual estimate.   There is no aortic valve stenosis.   There was tortuosity in the right subclavian making catheter manipulation somewhat difficult.  There was right radial spasm.   Successful PCI of the mid LAD.  Mild to moderate disease elsewhere in the coronary tree.  Continue aggressive preventive therapy along with dual antiplatelet therapy for at least 6 months.  After 6 months, would consider clopidogrel  monotherapy going forward given his diffuse atherosclerosis and calcification.    Echocardiogram: 03/2022 IMPRESSIONS     1. Left ventricular ejection fraction, by estimation, is 65 to 70%. The  left ventricle has normal function. The left ventricle has no regional  wall motion abnormalities. There is moderate asymmetric left ventricular  hypertrophy of the basal-septal  segment. Left ventricular diastolic parameters are indeterminate. Elevated  left atrial pressure. The average left ventricular global longitudinal  strain is -19.9 %. The global longitudinal strain is normal.   2. Right ventricular systolic function is normal. The right ventricular  size is normal.   3. The mitral valve is normal in structure. No evidence of mitral valve  regurgitation. No evidence of mitral stenosis.   4. The aortic valve is tricuspid. There is mild calcification of the  aortic valve. There  is mild thickening of the aortic valve. Aortic valve  regurgitation is not visualized. No aortic stenosis is present.   5. The inferior vena cava is normal in size with greater than 50%  respiratory variability, suggesting right atrial pressure of 3 mmHg.   Comparison(s): Echocardiogram done 06/04/19 showed an EF of 60-65%.    Event Monitor: 11/2022 Patch  Wear Time:  9 days and 20 hours (2024-10-22T22:09:02-398 to 2024-11-01T18:27:19-0400)   Rhythm:   Sinus rhythm   49 to 129 bpm   Average HR 73 bpm    Rare PAC, rare PVC 2 episodes of SVT, fastest 5 beats (135 bpm), longest 7 beats at 126 bpm   Triggered events correlated with SR, SR with PAC  NST: 12/2022   Findings are consistent with ischemia. The study is low risk.   No ST deviation was noted. The ECG was negative for ischemia.   LV perfusion is abnormal.  Small, mild intensity, mid to basal inferolateral defect that exhibits partial reversibility mainly at the base.  Although diaphragmatic attenuation is present and may also be contributing, this is suggestive of mild ischemia.   Left ventricular function is normal. Nuclear stress EF: 58%.   Overall low risk study with mild ischemic territory in the mid to basal inferolateral distribution, noted in the setting of diaphragmatic attenuation reducing the specificity of this finding.  LVEF normal at 58%.  Physical Exam:   VS:  BP 130/78   Pulse 70   Ht 5\' 11"  (1.803 m)   Wt 226 lb 6.4 oz (102.7 kg)   SpO2 95%   BMI 31.58 kg/m    Wt Readings from Last 3 Encounters:  03/02/23 226 lb 6.4 oz (102.7 kg)  01/04/23 227 lb 9.6 oz (103.2 kg)  11/30/22 221 lb 9.6 oz (100.5 kg)     GEN: Well nourished, well developed male appearing in no acute distress NECK: No JVD; No carotid bruits CARDIAC: RRR, no murmurs, rubs, gallops RESPIRATORY:  Clear to auscultation without rales, wheezing or rhonchi  ABDOMEN: Appears non-distended. No obvious abdominal masses. EXTREMITIES: No clubbing or cyanosis. No pitting edema.  Distal pedal pulses are 2+ bilaterally.   Assessment and Plan:   1. CAD - He did undergo DES placement to the mid LAD in 01/2022 and recent NST in 12/2022 showed a small defect which could be due to an area of mild ischemia or diaphragmatic attenuation. Dr. Avanell Bob did review the images and was not convinced of significant ischemia  with medical management recommended at that time. - He has overall been doing well and denies any recurrent episodes of chest pain. Continue current medical therapy with ASA 81 mg daily, Plavix  75 mg daily, Atorvastatin  80 mg daily and Toprol -XL 25 mg daily. His catheterization report from 01/2022 mentioned continuing DAPT for at least 6 months and after 6 months, would consider Plavix  monotherapy going forward given his diffuse atherosclerosis and calcification. Will review with Dr. Avanell Bob in regards to stopping ASA and continuing Plavix  alone since he is over a year out from intervention.  2. HTN - Blood pressure is well-controlled at 130/78 during today's visit. Continue current medical therapy with HCTZ 12.5 mg daily, Lisinopril  40 mg daily and Toprol -XL 25 mg daily.  3. HLD - LDL was at 41 in 04/2022. Continue current medical therapy with Atorvastatin  80 mg daily and Zetia  10 mg daily..  4. Ascending Aortic Aneurysm - CT in 01/2022 showed stable aneurysmal dilatation of the proximal ascending aorta measuring up to 4  cm with annual imaging recommended. We reviewed obtaining a follow-up CT scan and he wishes to have this scheduled following his next 41-month appointment.  Signed, Dorma Gash, PA-C

## 2023-03-02 NOTE — Patient Instructions (Signed)
 Medication Instructions:  Your physician recommends that you continue on your current medications as directed. Please refer to the Current Medication list given to you today.  *If you need a refill on your cardiac medications before your next appointment, please call your pharmacy*   Lab Work: NONE   If you have labs (blood work) drawn today and your tests are completely normal, you will receive your results only by: MyChart Message (if you have MyChart) OR A paper copy in the mail If you have any lab test that is abnormal or we need to change your treatment, we will call you to review the results.   Testing/Procedures: NONE    Follow-Up: At [facility], you and your health needs are our priority.  As part of our continuing mission to provide you with exceptional heart care, we have created designated Provider Care Teams.  These Care Teams include your primary Cardiologist (physician) and Advanced Practice Providers (APPs -  Physician Assistants and Nurse Practitioners) who all work together to provide you with the care you need, when you need it.  We recommend signing up for the patient portal called "MyChart".  Sign up information is provided on this After Visit Summary.  MyChart is used to connect with patients for Virtual Visits (Telemedicine).  Patients are able to view lab/test results, encounter notes, upcoming appointments, etc.  Non-urgent messages can be sent to your provider as well.   To learn more about what you can do with MyChart, go to [email or web address].    Your next appointment:   6 month(s)  Provider:   Ola Berger, MD or Woodfin Hays, PA-C    Other Instructions Thank you for choosing [facility]!

## 2023-03-07 ENCOUNTER — Telehealth: Payer: Self-pay | Admitting: Student

## 2023-03-07 NOTE — Telephone Encounter (Signed)
    Please let the patient know I did review with Dr. Tenny Craw and he can stop ASA since more than 1 year out from his stent. Continue Plavix 75mg  daily.   Signed, Ellsworth Lennox, PA-C 03/07/2023, 6:44 PM Pager: [phone]

## 2023-03-08 NOTE — Addendum Note (Signed)
Addended by: Marlyn Corporal A on: 03/08/2023 08:17 AM   Modules accepted: Orders

## 2023-03-08 NOTE — Telephone Encounter (Signed)
Patient notified, he will stop ASA

## 2023-03-08 NOTE — Telephone Encounter (Signed)
Patient is returning call.  °

## 2023-03-08 NOTE — Telephone Encounter (Signed)
Left message to return call 

## 2023-03-29 ENCOUNTER — Encounter: Payer: Self-pay | Admitting: Neurology

## 2023-03-29 ENCOUNTER — Ambulatory Visit: Payer: Medicare HMO | Admitting: Neurology

## 2023-03-29 VITALS — BP 148/78 | HR 71 | Ht 71.0 in | Wt 236.0 lb

## 2023-03-29 DIAGNOSIS — R4182 Altered mental status, unspecified: Secondary | ICD-10-CM | POA: Diagnosis not present

## 2023-03-29 DIAGNOSIS — R55 Syncope and collapse: Secondary | ICD-10-CM

## 2023-03-29 DIAGNOSIS — R4020 Unspecified coma: Secondary | ICD-10-CM | POA: Diagnosis not present

## 2023-03-29 DIAGNOSIS — R569 Unspecified convulsions: Secondary | ICD-10-CM | POA: Diagnosis not present

## 2023-03-29 NOTE — Progress Notes (Signed)
 [facility]    Provider:  Dr Lucia Gaskins Requesting Provider: Dyann Kief, PA-C Primary Care Provider:  Billie Lade, MD  CC:  Episode of unawareness, like cardiac  HPI:  Roger Powers is a 68 y.o. male here as requested by Dyann Kief, PA-C for episode of unresponsiveness in the setting oc chest pain. has Aortic root enlargement (HCC); Abnormal echocardiogram; Hypertension; Trigger finger, left ring finger; CAD (coronary artery disease); Hyperlipidemia with target LDL less than 70; Prediabetes; Erectile dysfunction; GERD (gastroesophageal reflux disease); Generalized anxiety disorder; and Insomnia on their problem list.   He doesn't remember anything. Was talking about fixing dinner and then was in the bed. First and only time. No inciting events. No prior illnesses. Found him slumped over and could not cummunicate or talk. He doesn't even remember prepping supper or going up to turn onm the shower, the last thing he remembers is sitting in recliner chairs that evening and the next time he remembers he wasin the bed, he had taken his clothes off. His wife reported she found him slumped over in a chair, hadn't gotten into the shower, couldn;t communictae or talk, holding his cghest coughing she gavce him 4 NTG. He remembers waking up in bed. He felt a weight on his chest he remembers and went to the ED. He was speaking to her and then improved even before he went to the ED but he was advised to go to the ED and went, CT Head was negative.4  Per wife she found him slumped in the chair unresponsive, he could hear her, couldn't speak, he got up out of the chair and they stubled together to the bed. In the bed, his eyes were glazed, like he could hear her and he may have been trying to saw something his mouth was moving but no output. She asked what is wrong, he put his hand on his chest, after 4 NTG 5 minutes apart; after the second NTG he was starting to respond but continued  to hold chest and shoulder and said "don;t call 911" after 3rd was communicating and he said he felt like he'd been hit in the chest and after the 4th NTG he seemed improved but still having chest pain and then a few minutes later he said he was ok and just needed to rest. No urination or defecation, no abnormal movements like a seizure, just was white as a sheet and eyes glassy. Patient remembers after 4th NTG his partner talking to him. She says he was presparing dinner and he was normal and answering and conscious per partner but he does not remember this.   Reviewed notes, labs and imaging from outside physicians, which showed:  CLINICAL DATA:  Altered mental status.   EXAM: CT HEAD WITHOUT CONTRAST   TECHNIQUE: Contiguous axial images were obtained from the base of the skull through the vertex without intravenous contrast.   RADIATION DOSE REDUCTION: This exam was performed according to the departmental dose-optimization program which includes automated exposure control, adjustment of the mA and/or kV according to patient size and/or use of iterative reconstruction technique.   COMPARISON:  Head CT dated 01/30/2020.   FINDINGS: Brain: The ventricles and sulci are appropriate size for the patient's age. The gray-white matter discrimination is preserved. There is no acute intracranial hemorrhage. No mass effect or midline shift. No extra-axial fluid collection.   Vascular: No hyperdense vessel or unexpected calcification.   Skull: Normal. Negative for fracture or focal lesion.  Sinuses/Orbits: No acute finding.   Other: None   IMPRESSION: No acute intracranial pathology.     Electronically Signed   By: Elgie Collard M.D.   On: 11/16/2022 19:39     Latest Ref Rng & Units 11/16/2022    1:47 PM 10/05/2022   10:24 AM 03/16/2022    1:36 PM  CBC  WBC 4.0 - 10.5 K/uL 9.0  7.5  8.4   Hemoglobin 13.0 - 17.0 g/dL 54.0  98.1  19.1   Hematocrit 39.0 - 52.0 % 45.7  43.9  39.2    Platelets 150 - 400 K/uL 285  266  286       Latest Ref Rng & Units 11/16/2022    1:47 PM 10/05/2022   10:24 AM 05/12/2022    7:07 AM  CMP  Glucose 70 - 99 mg/dL 478  98    BUN 8 - 23 mg/dL 16  15    Creatinine 2.95 - 1.24 mg/dL 6.21  3.08    Sodium 657 - 145 mmol/L 139  138    Potassium 3.5 - 5.1 mmol/L 3.9  4.9    Chloride 98 - 111 mmol/L 104  105    CO2 22 - 32 mmol/L 22  23    Calcium 8.9 - 10.3 mg/dL 9.3  9.5    Total Protein 6.5 - 8.1 g/dL 7.0   6.7   Total Bilirubin 0.3 - 1.2 mg/dL 1.0   0.2   Alkaline Phos 38 - 126 U/L 62     AST 15 - 41 U/L 27   27   ALT 0 - 44 U/L 39   33      Review of Systems: Patient complains of symptoms per HPI as well as the following symptoms none. Pertinent negatives and positives per HPI. All others negative.   Social History   Socioeconomic History   Marital status: Divorced    Spouse name: Not on file   Number of children: Not on file   Years of education: Not on file   Highest education level: Not on file  Occupational History   Not on file  Tobacco Use   Smoking status: Former    Current packs/day: 0.00    Types: Cigarettes    Quit date: 11/18/1979    Years since quitting: 43.4   Smokeless tobacco: Former  Building services engineer status: Never Used  Substance and Sexual Activity   Alcohol use: Yes    Alcohol/week: 2.0 standard drinks of alcohol    Types: 2 Standard drinks or equivalent per week    Comment: rare   Drug use: No   Sexual activity: Not on file  Other Topics Concern   Not on file  Social History Narrative   Not on file   Social Drivers of Health   Financial Resource Strain: Not on file  Food Insecurity: Not on file  Transportation Needs: Not on file  Physical Activity: Not on file  Stress: Not on file  Social Connections: Not on file  Intimate Partner Violence: Not on file    Family History  Problem Relation Age of Onset   Breast cancer Mother    Diabetes Mother    Lung cancer Mother    Heart  disease Father    Heart disease Brother    Heart disease Brother    Colon polyps Neg Hx    Colon cancer Neg Hx    Esophageal cancer Neg Hx    Stomach cancer Neg  Hx    Rectal cancer Neg Hx     Past Medical History:  Diagnosis Date   CAD (coronary artery disease)    02/12/22: LHC DES 2.50x16 to midLAD, residual dz tx medically   Cataract    RIGHT eye only   GERD (gastroesophageal reflux disease)    hx of- with certain foods- on meds   Hypercholesteremia    on meds   Hypertension    on meds   Psoriasis     Patient Active Problem List   Diagnosis Date Noted   Prediabetes 01/04/2023   Erectile dysfunction 01/04/2023   GERD (gastroesophageal reflux disease) 01/04/2023   Generalized anxiety disorder 01/04/2023   Insomnia 01/04/2023   CAD (coronary artery disease) 02/12/2022   Hyperlipidemia with target LDL less than 70 02/12/2022   Trigger finger, left ring finger 05/16/2019   Aortic root enlargement (HCC) 05/23/2011   Abnormal echocardiogram 05/23/2011   Hypertension 05/23/2011    Past Surgical History:  Procedure Laterality Date   CATARACT EXTRACTION W/ INTRAOCULAR LENS IMPLANT Right 2013   COLONOSCOPY  2004   patient cannot remember where/MD   CORONARY STENT INTERVENTION N/A 02/12/2022   Procedure: CORONARY STENT INTERVENTION;  Surgeon: Corky Crafts, MD;  Location: [facility] INVASIVE CV LAB;  Service: Cardiovascular;  Laterality: N/A;   LEFT HEART CATH AND CORONARY ANGIOGRAPHY N/A 02/12/2022   Procedure: LEFT HEART CATH AND CORONARY ANGIOGRAPHY;  Surgeon: Corky Crafts, MD;  Location: [facility] INVASIVE CV LAB;  Service: Cardiovascular;  Laterality: N/A;   RETINAL DETACHMENT SURGERY Right 2012   WISDOM TOOTH EXTRACTION      Current Outpatient Medications  Medication Sig Dispense Refill   atorvastatin (LIPITOR) 80 MG tablet Take 1 tablet (80 mg total) by mouth daily. 90 tablet 3   Cholecalciferol 100 MCG (4000 UT) CAPS Take 1 capsule (4,000 Units total) by mouth  daily. 30 capsule 6   clobetasol (TEMOVATE) 0.05 % external solution Apply 1 Application topically daily as needed (psoriasis). 50 mL 1   clonazePAM (KLONOPIN) 1 MG tablet Take 1 mg by mouth at bedtime as needed for anxiety.     clopidogrel (PLAVIX) 75 MG tablet Take 1 tablet (75 mg total) by mouth daily with breakfast. 90 tablet 3   diazepam (VALIUM) 10 MG tablet Take 10 mg by mouth every 8 (eight) hours as needed for anxiety.     ezetimibe (ZETIA) 10 MG tablet Take 1 tablet (10 mg total) by mouth daily. 90 tablet 3   hydrochlorothiazide (MICROZIDE) 12.5 MG capsule Take 1 capsule (12.5 mg total) by mouth daily. 90 capsule 3   lisinopril (ZESTRIL) 40 MG tablet TAKE ONE TABLET (40MG  TOTAL) BY MOUTH DAILY 90 tablet 3   metoprolol succinate (TOPROL XL) 25 MG 24 hr tablet Take 1 tablet (25 mg total) by mouth daily. 90 tablet 3   Multiple Vitamins-Minerals (MULTIVITAMIN MEN 50+ PO) Take 1 tablet by mouth daily.     nitroGLYCERIN (NITROSTAT) 0.4 MG SL tablet Place 1 tablet (0.4 mg total) under the tongue every 5 (five) minutes as needed for chest pain. 25 tablet 3   pantoprazole (PROTONIX) 40 MG tablet Take 1 tablet (40 mg total) by mouth daily. 90 tablet 3   tadalafil (CIALIS) 20 MG tablet Take 20 mg by mouth daily as needed for erectile dysfunction.     zolpidem (AMBIEN) 10 MG tablet Take 10 mg by mouth at bedtime as needed for sleep.      No current facility-administered medications for this visit.  Allergies as of 03/29/2023   (No Known Allergies)    Vitals: BP (!) 148/78 (BP Location: Right Arm, Patient Position: Sitting, Cuff Size: Normal)   Pulse 71   Ht 5\' 11"  (1.803 m)   Wt 236 lb (107 kg)   BMI 32.92 kg/m  Last Weight:  Wt Readings from Last 1 Encounters:  03/29/23 236 lb (107 kg)   Last Height:   Ht Readings from Last 1 Encounters:  03/29/23 5\' 11"  (1.803 m)     Physical exam: Exam: Gen: NAD, conversant, well nourised, obese, well groomed                     CV: RRR,  no MRG. No Carotid Bruits. No peripheral edema, warm, nontender Eyes: Conjunctivae clear without exudates or hemorrhage  Neuro: Detailed Neurologic Exam  Speech:    Speech is normal; fluent and spontaneous with normal comprehension.  Cognition:    The patient is oriented to person, place, and time;     recent and remote memory intact;     language fluent;     normal attention, concentration,     fund of knowledge Cranial Nerves:    The pupils are equal, round, and reactive to light. The fundi are normal and spontaneous venous pulsations are present. Visual fields are full to finger confrontation. Extraocular movements are intact. Trigeminal sensation is intact and the muscles of mastication are normal. The face is symmetric. The palate elevates in the midline. Hearing intact. Voice is normal. Shoulder shrug is normal. The tongue has normal motion without fasciculations.   Coordination: nml  Gait:    Heel-toe and tandem gait are normal.   Motor Observation: nml Tone:    Normal muscle tone.    Posture:    Posture is normal. normal erect    Strength:    Strength is V/V in the upper and lower limbs.      Sensation: intact to LT     Reflex Exam:  DTR's:    Deep tendon reflexes in the upper and lower extremities are normal bilaterally.   Toes:    The toes are downgoing bilaterally.   Clonus:    Clonus is absent.    Assessment/Plan:  Patient with an episode of altered mental status in the setting of check pain, suspect cardiac etiology but cannot rule out seizure event or other such as stroke  MRI of the brain w/wo contrast and MRA head - [place] [facility] here in the office and then a prolonged (after the routine EEG they can decide about a 3-day eeg) Per [facility] statutes, patients with unknown reason for loss of consciousness are not allowed to drive until they have been evaluated of episode free for six months.   Per [facility] statutes, patients with  seizures are not allowed to drive until they have been seizure-free for six months.    Use caution when using heavy equipment or power tools. Avoid working on ladders or at heights. Take showers instead of baths. Ensure the water temperature is not too high on the home water heater. Do not go swimming alone. Do not lock yourself in a room alone (i.e. bathroom). When caring for infants or small children, sit down when holding, feeding, or changing them to minimize risk of injury to the child in the event you have a seizure. Maintain good sleep hygiene. Avoid alcohol.    If patient has another seizure, call 911 and bring them back to  the ED if: A.  The seizure lasts longer than 5 minutes.      B.  The patient doesn't wake shortly after the seizure or has new problems such as difficulty seeing, speaking or moving following the seizure C.  The patient was injured during the seizure D.  The patient has a temperature over 102 F (39C) E.  The patient vomited during the seizure and now is having trouble breathing  Per [facility] statutes, patients with seizures are not allowed to drive until they have been seizure-free for six months.  Other recommendations include using caution when using heavy equipment or power tools. Avoid working on ladders or at heights. Take showers instead of baths.  Do not swim alone.  Ensure the water temperature is not too high on the home water heater. Do not go swimming alone. Do not lock yourself in a room alone (i.e. bathroom). When caring for infants or small children, sit down when holding, feeding, or changing them to minimize risk of injury to the child in the event you have a seizure. Maintain good sleep hygiene. Avoid alcohol.  Also recommend adequate sleep, hydration, good diet and minimize stress.  During the Seizure  - First, ensure adequate ventilation and place patients on the floor on their left side  Loosen clothing around the neck and ensure the airway is  patent. If the patient is clenching the teeth, do not force the mouth open with any object as this can cause severe damage - Remove all items from the surrounding that can be hazardous. The patient may be oblivious to what's happening and may not even know what he or she is doing. If the patient is confused and wandering, either gently guide him/her away and block access to outside areas - Reassure the individual and be comforting - Call 911. In most cases, the seizure ends before EMS arrives. However, there are cases when seizures may last over 3 to 5 minutes. Or the individual may have developed breathing difficulties or severe injuries. If a pregnant patient or a person with diabetes develops a seizure, it is prudent to call an ambulance. - Finally, if the patient does not regain full consciousness, then call EMS. Most patients will remain confused for about 45 to 90 minutes after a seizure, so you must use judgment in calling for help. - Avoid restraints but make sure the patient is in a bed with padded side rails - Place the individual in a lateral position with the neck slightly flexed; this will help the saliva drain from the mouth and prevent the tongue from falling backward - Remove all nearby furniture and other hazards from the area - Provide verbal assurance as the individual is regaining consciousness - Provide the patient with privacy if possible - Call for help and start treatment as ordered by the caregiver   fter the Seizure (Postictal Stage)  After a seizure, most patients experience confusion, fatigue, muscle pain and/or a headache. Thus, one should permit the individual to sleep. For the next few days, reassurance is essential. Being calm and helping reorient the person is also of importance.  Most seizures are painless and end spontaneously. Seizures are not harmful to others but can lead to complications such as stress on the lungs, brain and the heart. Individuals with prior  lung problems may develop labored breathing and respiratory distress.    Orders Placed This Encounter  Procedures   MR BRAIN W WO CONTRAST   MR ANGIO  HEAD WO CONTRAST   EEG adult   No orders of the defined types were placed in this encounter.   Cc: Collier Bullock,  Billie Lade, MD  Naomie Dean, MD  [facility] [address] [place], [place] [address]  Phone [phone] Fax [phone]

## 2023-03-29 NOTE — Patient Instructions (Addendum)
MRI of the brain w/wo contrast and MRA head - [place] [facility] here in the office and then a prolonged (after the routine EEG they can decide about a 3-day eeg) Per [facility] statutes, patients with unknown reason for loss of consciousness are not allowed to drive until they have been evaluated of episode free for six months.

## 2023-04-03 ENCOUNTER — Encounter: Payer: Self-pay | Admitting: Neurology

## 2023-04-04 ENCOUNTER — Telehealth: Payer: Self-pay | Admitting: Neurology

## 2023-04-04 NOTE — Telephone Encounter (Signed)
 MRI brain [employer] auth: [id number] exp. 04/04/23-10/01/23  MRA head [employer] medicare auth: [id number] exp. 04/04/23-10/01/23 sent to [facility] [phone]

## 2023-04-12 ENCOUNTER — Ambulatory Visit (INDEPENDENT_AMBULATORY_CARE_PROVIDER_SITE_OTHER): Payer: Medicare HMO

## 2023-04-12 VITALS — BP 140/80 | Ht 71.0 in | Wt 220.0 lb

## 2023-04-12 DIAGNOSIS — Z0000 Encounter for general adult medical examination without abnormal findings: Secondary | ICD-10-CM

## 2023-04-12 DIAGNOSIS — Z1159 Encounter for screening for other viral diseases: Secondary | ICD-10-CM

## 2023-04-12 NOTE — Patient Instructions (Signed)
 [name] , Thank you for taking time to come for your Medicare Wellness Visit. I appreciate your ongoing commitment to your health goals. Please review the following plan we discussed and let me know if I can assist you in the future.   Referrals/Orders/Follow-Ups/Clinician Recommendations:  Next Medicare Annual Wellness Visit:   April 16, 2024 at 11:20 am video visit.  A Hepatitis C Screening has been ordered for you today. You do not have to fast to have this lab drawn.   GET WELL SOON!!!  This is a list of the screening recommended for you and due dates:  Health Maintenance  Topic Date Due   Pneumonia Vaccine (1 of 2 - PCV) Never done   Hepatitis C Screening  Never done   DTaP/Tdap/Td vaccine (1 - Tdap) Never done   Zoster (Shingles) Vaccine (1 of 2) Never done   COVID-19 Vaccine (2 - Janssen risk series) 01/17/2020   Flu Shot  05/16/2023*   Medicare Annual Wellness Visit  04/11/2024   Colon Cancer Screening  01/27/2028   HPV Vaccine  Aged Out  *Topic was postponed. The date shown is not the original due date.    Advanced directives: (Declined) Advance directive discussed with you today. Even though you declined this today, please call our office should you change your mind, and we can give you the proper paperwork for you to fill out.  Next Medicare Annual Wellness Visit scheduled for next year: yes  Understanding Your Risk for Falls Millions of people have serious injuries from falls each year. It is important to understand your risk of falling. Talk with your health care provider about your risk and what you can do to lower it. If you do have a serious fall, make sure to tell your provider. Falling once raises your risk of falling again. How can falls affect me? Serious injuries from falls are common. These include: Broken bones, such as hip fractures. Head injuries, such as traumatic brain injuries (TBI) or concussions. A fear of falling can cause you to avoid activities and  stay at home. This can make your muscles weaker and raise your risk for a fall. What can increase my risk? There are a number of risk factors that increase your risk for falling. The more risk factors you have, the higher your risk of falling. Serious injuries from a fall happen most often to people who are older than 70 years old. Teenagers and young adults ages 40-29 are also at higher risk. Common risk factors include: Weakness in the lower body. Being generally weak or confused due to long-term (chronic) illness. Dizziness or balance problems. Poor vision. Medicines that cause dizziness or drowsiness. These may include: Medicines for your blood pressure, heart, anxiety, insomnia, or swelling (edema). Pain medicines. Muscle relaxants. Other risk factors include: Drinking alcohol. Having had a fall in the past. Having foot pain or wearing improper footwear. Working at a dangerous job. Having any of the following in your home: Tripping hazards, such as floor clutter or loose rugs. Poor lighting. Pets. Having dementia or memory loss. What actions can I take to lower my risk of falling?     Physical activity Stay physically fit. Do strength and balance exercises. Consider taking a regular class to build strength and balance. Yoga and tai chi are good options. Vision Have your eyes checked every year and your prescription for glasses or contacts updated as needed. Shoes and walking aids Wear non-skid shoes. Wear shoes that have rubber soles  and low heels. Do not wear high heels. Do not walk around the house in socks or slippers. Use a cane or walker as told by your provider. Home safety Attach secure railings on both sides of your stairs. Install grab bars for your bathtub, shower, and toilet. Use a non-skid mat in your bathtub or shower. Attach bath mats securely with double-sided, non-slip rug tape. Use good lighting in all rooms. Keep a flashlight near your bed. Make sure  there is a clear path from your bed to the bathroom. Use night-lights. Do not use throw rugs. Make sure all carpeting is taped or tacked down securely. Remove all clutter from walkways and stairways, including extension cords. Repair uneven or broken steps and floors. Avoid walking on icy or slippery surfaces. Walk on the grass instead of on icy or slick sidewalks. Use ice melter to get rid of ice on walkways in the winter. Use a cordless phone. Questions to ask your health care provider Can you help me check my risk for a fall? Do any of my medicines make me more likely to fall? Should I take a vitamin D supplement? What exercises can I do to improve my strength and balance? Should I make an appointment to have my vision checked? Do I need a bone density test to check for weak bones (osteoporosis)? Would it help to use a cane or a walker? Where to find more information Centers for Disease Control and Prevention, [employer]AD[employer]: TonerPromos.no Community-Based Fall Prevention Programs: TonerPromos.no [employer] on Aging: BaseRingTones.pl Contact a health care provider if: You fall at home. You are afraid of falling at home. You feel weak, drowsy, or dizzy. This information is not intended to replace advice given to you by your health care provider. Make sure you discuss any questions you have with your health care provider. Document Revised: 10/05/2021 Document Reviewed: 10/05/2021 Elsevier Patient Education  2024 [employer].

## 2023-04-12 NOTE — Progress Notes (Signed)
 Because this visit was a virtual/telehealth visit,  certain criteria was not obtained, such a blood pressure, CBG if applicable, and timed get up and go. Any medications not marked as "taking" were not mentioned during the medication reconciliation part of the visit. Any vitals not documented were not able to be obtained due to this being a telehealth visit or patient was unable to self-report a recent blood pressure reading due to a lack of equipment at home via telehealth. Vitals that have been documented are verbally provided by the patient.   Subjective:   Roger Powers is a 68 y.o. who presents for a Medicare Wellness preventive visit.  Visit Complete: Virtual I connected with  Roger Powers on 04/12/23 by a audio enabled telemedicine application and verified that I am speaking with the correct person using two identifiers.  Patient Location: Home  Provider Location: Home Office  I discussed the limitations of evaluation and management by telemedicine. The patient expressed understanding and agreed to proceed.  Vital Signs: Because this visit was a virtual/telehealth visit, some criteria may be missing or patient reported. Any vitals not documented were not able to be obtained and vitals that have been documented are patient reported.  VideoError- Librarian, academic were attempted between this provider and patient, however failed, due to patient having technical difficulties OR patient did not have access to video capability.  We continued and completed visit with audio only.   AWV Questionnaire: No: Patient Medicare AWV questionnaire was not completed prior to this visit.  Cardiac Risk Factors include: advanced age (>50men, >38 women);dyslipidemia;hypertension;male gender;obesity (BMI >30kg/m2)     Objective:    Today's Vitals   04/12/23 1122  BP: (!) 140/80  Weight: 220 lb (99.8 kg)  Height: 5\' 11"  (1.803 m)   Body mass index is 30.68 kg/m.      04/12/2023   11:25 AM 11/16/2022    1:19 PM 03/18/2022   12:59 PM 02/12/2022    6:10 AM 01/30/2020    8:29 AM  Advanced Directives  Does Patient Have a Medical Advance Directive? No Yes Yes Yes No  Type of Special educational needs teacher of [place] [place];Living will  Healthcare Power of [place];Living will   Does patient want to make changes to medical advance directive?   No - Patient declined    Copy of Healthcare Power of Attorney in Chart?  No - copy requested, Physician notified  No - copy requested   Would patient like information on creating a medical advance directive? No - Patient declined No - Patient declined       Current Medications (verified) Outpatient Encounter Medications as of 04/12/2023  Medication Sig   atorvastatin (LIPITOR) 80 MG tablet Take 1 tablet (80 mg total) by mouth daily.   Cholecalciferol 100 MCG (4000 UT) CAPS Take 1 capsule (4,000 Units total) by mouth daily.   clobetasol (TEMOVATE) 0.05 % external solution Apply 1 Application topically daily as needed (psoriasis).   clonazePAM (KLONOPIN) 1 MG tablet Take 1 mg by mouth at bedtime as needed for anxiety.   clopidogrel (PLAVIX) 75 MG tablet Take 1 tablet (75 mg total) by mouth daily with breakfast.   diazepam (VALIUM) 10 MG tablet Take 10 mg by mouth every 8 (eight) hours as needed for anxiety.   ezetimibe (ZETIA) 10 MG tablet Take 1 tablet (10 mg total) by mouth daily.   hydrochlorothiazide (MICROZIDE) 12.5 MG capsule Take 1 capsule (12.5 mg total) by mouth daily.  lisinopril (ZESTRIL) 40 MG tablet TAKE ONE TABLET (40MG  TOTAL) BY MOUTH DAILY   metoprolol succinate (TOPROL XL) 25 MG 24 hr tablet Take 1 tablet (25 mg total) by mouth daily.   Multiple Vitamins-Minerals (MULTIVITAMIN MEN 50+ PO) Take 1 tablet by mouth daily.   nitroGLYCERIN (NITROSTAT) 0.4 MG SL tablet Place 1 tablet (0.4 mg total) under the tongue every 5 (five) minutes as needed for chest pain.   pantoprazole (PROTONIX) 40 MG tablet Take 1 tablet  (40 mg total) by mouth daily.   tadalafil (CIALIS) 20 MG tablet Take 20 mg by mouth daily as needed for erectile dysfunction.   zolpidem (AMBIEN) 10 MG tablet Take 10 mg by mouth at bedtime as needed for sleep.    No facility-administered encounter medications on file as of 04/12/2023.    Allergies (verified) Patient has no known allergies.   History: Past Medical History:  Diagnosis Date   CAD (coronary artery disease)    02/12/22: LHC DES 2.50x16 to midLAD, residual dz tx medically   Cataract    RIGHT eye only   GERD (gastroesophageal reflux disease)    hx of- with certain foods- on meds   Hypercholesteremia    on meds   Hypertension    on meds   Psoriasis    Past Surgical History:  Procedure Laterality Date   CATARACT EXTRACTION W/ INTRAOCULAR LENS IMPLANT Right 2013   COLONOSCOPY  2004   patient cannot remember where/MD   CORONARY STENT INTERVENTION N/A 02/12/2022   Procedure: CORONARY STENT INTERVENTION;  Surgeon: Corky Crafts, MD;  Location: [facility] INVASIVE CV LAB;  Service: Cardiovascular;  Laterality: N/A;   LEFT HEART CATH AND CORONARY ANGIOGRAPHY N/A 02/12/2022   Procedure: LEFT HEART CATH AND CORONARY ANGIOGRAPHY;  Surgeon: Corky Crafts, MD;  Location: [facility] INVASIVE CV LAB;  Service: Cardiovascular;  Laterality: N/A;   RETINAL DETACHMENT SURGERY Right 2012   WISDOM TOOTH EXTRACTION     Family History  Problem Relation Age of Onset   Breast cancer Mother    Diabetes Mother    Lung cancer Mother    Heart disease Father    Heart disease Brother    Heart disease Brother    Colon polyps Neg Hx    Colon cancer Neg Hx    Esophageal cancer Neg Hx    Stomach cancer Neg Hx    Rectal cancer Neg Hx    Social History   Socioeconomic History   Marital status: Divorced    Spouse name: Not on file   Number of children: Not on file   Years of education: Not on file   Highest education level: Not on file  Occupational History   Not on file  Tobacco Use    Smoking status: Former    Current packs/day: 0.00    Types: Cigarettes    Quit date: 11/18/1979    Years since quitting: 43.4   Smokeless tobacco: Former  Building services engineer status: Never Used  Substance and Sexual Activity   Alcohol use: Yes    Alcohol/week: 2.0 standard drinks of alcohol    Types: 2 Standard drinks or equivalent per week    Comment: rare   Drug use: No   Sexual activity: Not on file  Other Topics Concern   Not on file  Social History Narrative   Not on file   Social Drivers of Health   Financial Resource Strain: Low Risk  (04/12/2023)   Overall Financial Resource Strain (CARDIA)  Difficulty of Paying Living Expenses: Not hard at all  Food Insecurity: No Food Insecurity (04/12/2023)   Hunger Vital Sign    Worried About Running Out of Food in the Last Year: Never true    Ran Out of Food in the Last Year: Never true  Transportation Needs: No Transportation Needs (04/12/2023)   PRAPARE - Administrator, Civil Service (Medical): No    Lack of Transportation (Non-Medical): No  Physical Activity: Sufficiently Active (04/12/2023)   Exercise Vital Sign    Days of Exercise per Week: 7 days    Minutes of Exercise per Session: 30 min  Stress: No Stress Concern Present (04/12/2023)   [employer] of Occupational Health - Occupational Stress Questionnaire    Feeling of Stress : Not at all  Social Connections: Moderately Integrated (04/12/2023)   Social Connection and Isolation Panel [NHANES]    Frequency of Communication with Friends and Family: More than three times a week    Frequency of Social Gatherings with Friends and Family: More than three times a week    Attends Religious Services: More than 4 times per year    Active Member of [employer] or Organizations: Yes    Attends Engineer, structural: More than 4 times per year    Marital Status: Divorced    Tobacco Counseling Counseling given: Yes    Clinical Intake:  Pre-visit  preparation completed: Yes  Pain : No/denies pain     BMI - recorded: 30.68 Nutritional Status: BMI > 30  Obese Nutritional Risks: None Diabetes: No  How often do you need to have someone help you when you read instructions, pamphlets, or other written materials from your doctor or pharmacy?: 1 - Never  Interpreter Needed?: No  Information entered by :: Maryjean Ka CMA   Activities of Daily Living     04/12/2023   11:24 AM  In your present state of health, do you have any difficulty performing the following activities:  Hearing? 0  Vision? 0  Difficulty concentrating or making decisions? 0  Walking or climbing stairs? 0  Dressing or bathing? 0  Doing errands, shopping? 0  Preparing Food and eating ? N  Using the Toilet? N  In the past six months, have you accidently leaked urine? N  Do you have problems with loss of bowel control? N  Managing your Medications? N  Managing your Finances? N  Housekeeping or managing your Housekeeping? N    Patient Care Team: Billie Lade, MD as PCP - General (Internal Medicine) Pricilla Riffle, MD as PCP - Cardiology (Cardiology) Daisy Lazar, DO (Optometry) Anson Fret, MD (Neurology) Cottle, Steva Ready., MD as Attending Physician (Psychiatry)  Indicate any recent Medical Services you may have received from other than [facility] providers in the past year (date may be approximate).     Assessment:   This is a routine wellness examination for [place].  Hearing/Vision screen Hearing Screening - Comments:: Patient denies any hearing difficulties.   Vision Screening - Comments:: Wears rx glasses - up to date with routine eye exams  Patient sees Dr. Daisy Lazar w/ [facility] [place] office.     Goals Addressed             This Visit's Progress    Patient Stated       Lose weight       Depression Screen     04/12/2023   11:26 AM 06/17/2022   10:46 AM 03/18/2022  1:42 PM  PHQ 2/9 Scores  PHQ - 2 Score 0 0 0  PHQ- 9  Score 0 1 6    Fall Risk     04/12/2023   11:25 AM 01/04/2023    9:44 AM 03/18/2022    1:42 PM  Fall Risk   Falls in the past year? 0 0 0  Number falls in past yr: 0 0 0  Injury with Fall? 0 0   Risk for fall due to : No Fall Risks  No Fall Risks  Follow up Falls prevention discussed;Falls evaluation completed  Falls evaluation completed    MEDICARE RISK AT HOME:  Medicare Risk at Home Any stairs in or around the home?: No If so, are there any without handrails?: No Home free of loose throw rugs in walkways, pet beds, electrical cords, etc?: Yes Adequate lighting in your home to reduce risk of falls?: Yes Life alert?: No Use of a cane, walker or w/c?: No Grab bars in the bathroom?: No Shower chair or bench in shower?: No Elevated toilet seat or a handicapped toilet?: Yes  TIMED UP AND GO:  Was the test performed?  No  Cognitive Function: 6CIT completed        04/12/2023   11:25 AM  6CIT Screen  What Year? 0 points  What month? 0 points  What time? 0 points  Count back from 20 0 points  Months in reverse 0 points  Repeat phrase 0 points  Total Score 0 points    Immunizations Immunization History  Administered Date(s) Administered   Janssen (J&J) SARS-COV-2 Vaccination 12/20/2019    Screening Tests Health Maintenance  Topic Date Due   Pneumonia Vaccine 26+ Years old (1 of 2 - PCV) Never done   Hepatitis C Screening  Never done   DTaP/Tdap/Td (1 - Tdap) Never done   Zoster Vaccines- Shingrix (1 of 2) Never done   COVID-19 Vaccine (2 - Janssen risk series) 01/17/2020   INFLUENZA VACCINE  05/16/2023 (Originally 09/16/2022)   Medicare Annual Wellness (AWV)  04/11/2024   Colonoscopy  01/27/2028   HPV VACCINES  Aged Out    Health Maintenance  Health Maintenance Due  Topic Date Due   Pneumonia Vaccine 4+ Years old (1 of 2 - PCV) Never done   Hepatitis C Screening  Never done   DTaP/Tdap/Td (1 - Tdap) Never done   Zoster Vaccines- Shingrix (1 of 2) Never  done   COVID-19 Vaccine (2 - Janssen risk series) 01/17/2020   Health Maintenance Items Addressed: Hepatitis C Screening  Additional Screening:  Vision Screening: Recommended annual ophthalmology exams for early detection of glaucoma and other disorders of the eye.  Dental Screening: Recommended annual dental exams for proper oral hygiene  Community Resource Referral / Chronic Care Management: CRR required this visit?  No   CCM required this visit?  No     Plan:     I have personally reviewed and noted the following in the patient's chart:   Medical and social history Use of alcohol, tobacco or illicit drugs  Current medications and supplements including opioid prescriptions. Patient is not currently taking opioid prescriptions. Functional ability and status Nutritional status Physical activity Advanced directives List of other physicians Hospitalizations, surgeries, and ER visits in previous 12 months Vitals Screenings to include cognitive, depression, and falls Referrals and appointments  In addition, I have reviewed and discussed with patient certain preventive protocols, quality metrics, and best practice recommendations. A written personalized care plan for preventive  services as well as general preventive health recommendations were provided to patient.     Jordan Hawks Tyona Nilsen, CMA   04/12/2023   After Visit Summary: (MyChart) Due to this being a telephonic visit, the after visit summary with patients personalized plan was offered to patient via MyChart   Notes: Nothing significant to report at this time.

## 2023-04-13 ENCOUNTER — Ambulatory Visit (HOSPITAL_COMMUNITY): Payer: Medicare HMO

## 2023-04-18 ENCOUNTER — Other Ambulatory Visit: Payer: Medicare HMO | Admitting: *Deleted

## 2023-04-19 ENCOUNTER — Ambulatory Visit: Payer: Medicare HMO | Admitting: Neurology

## 2023-04-19 DIAGNOSIS — R4020 Unspecified coma: Secondary | ICD-10-CM | POA: Diagnosis not present

## 2023-04-19 NOTE — Procedures (Signed)
   History:  68 year old man with an episode of loss of consciousness  EEG classification:  Awake and asleep  Duration: 26 minutes   Technical aspects: This EEG study was done with scalp electrodes positioned according to the 10-20 International system of electrode placement. Electrical activity was reviewed with band pass filter of 1-70Hz , sensitivity of 7 uV/mm, display speed of 54mm/sec with a 60Hz  notched filter applied as appropriate. EEG data were recorded continuously and digitally stored.   Description of the recording: The background rhythms of this recording consists of a fairly well modulated medium amplitude background activity of 9 Hz. As the record progresses, the patient initially is in the waking state, but appears to enter the early stage II sleep during the recording, with rudimentary sleep spindles and vertex sharp wave activity seen. During the wakeful state, photic stimulation was performed, and no abnormal responses were seen. Hyperventilation was also performed, no abnormal response seen. No epileptiform discharges seen during this recording. There was no focal slowing.   Abnormality: None   Impression: This is a normal awake and sleep EEG. No evidence of interictal epileptiform discharges. Normal EEGs, however, do not rule out epilepsy.    Windell Norfolk, MD [facility]

## 2023-04-20 ENCOUNTER — Telehealth: Payer: Self-pay | Admitting: *Deleted

## 2023-04-20 ENCOUNTER — Encounter: Payer: Self-pay | Admitting: Neurology

## 2023-04-20 NOTE — Telephone Encounter (Signed)
 I called pt and relayed the EEG results of normal study.  I asked he wanted to move forward with ambulatory 3 day EEG or to monitor.  He stated he has MRI next week, and is ok to monitor at this time.  Will call us back for questions/concerns.  He verbalized understanding.  Appreciated callback.

## 2023-04-20 NOTE — Telephone Encounter (Signed)
-----   Message from Anson Fret sent at 04/20/2023  8:14 AM EST ----- Routine EEG was normal. Please ask if they would like to ocntinue to the ambulatory 3 day eeg or if they would like to monitor. Thank you

## 2023-04-27 ENCOUNTER — Ambulatory Visit (HOSPITAL_COMMUNITY)
Admission: RE | Admit: 2023-04-27 | Discharge: 2023-04-27 | Disposition: A | Discharge status: Home / Self Care | Payer: Medicare HMO | Source: Ambulatory Visit | Attending: Neurology | Admitting: Neurology

## 2023-04-27 ENCOUNTER — Ambulatory Visit (HOSPITAL_COMMUNITY)
Admission: RE | Admit: 2023-04-27 | Discharge: 2023-04-27 | Disposition: A | Discharge status: Home / Self Care | Payer: Medicare HMO | Source: Ambulatory Visit | Attending: Neurology

## 2023-04-27 DIAGNOSIS — R4020 Unspecified coma: Secondary | ICD-10-CM | POA: Insufficient documentation

## 2023-04-27 DIAGNOSIS — R4182 Altered mental status, unspecified: Secondary | ICD-10-CM | POA: Insufficient documentation

## 2023-04-27 DIAGNOSIS — R569 Unspecified convulsions: Secondary | ICD-10-CM | POA: Insufficient documentation

## 2023-04-27 DIAGNOSIS — I6782 Cerebral ischemia: Secondary | ICD-10-CM | POA: Diagnosis not present

## 2023-04-27 DIAGNOSIS — I6521 Occlusion and stenosis of right carotid artery: Secondary | ICD-10-CM | POA: Diagnosis not present

## 2023-04-27 DIAGNOSIS — R55 Syncope and collapse: Secondary | ICD-10-CM | POA: Insufficient documentation

## 2023-04-27 MED ORDER — GADOBUTROL 1 MMOL/ML IV SOLN
10.0000 mL | Freq: Once | INTRAVENOUS | Status: AC | PRN
  Administered 2023-04-27: 10 mL via INTRAVENOUS

## 2023-05-11 ENCOUNTER — Other Ambulatory Visit: Payer: Self-pay | Admitting: Internal Medicine

## 2023-05-12 NOTE — Telephone Encounter (Signed)
 This is a [place] pt.

## 2023-05-16 ENCOUNTER — Encounter: Payer: Self-pay | Admitting: Neurology

## 2023-05-18 ENCOUNTER — Other Ambulatory Visit: Payer: Self-pay | Admitting: Internal Medicine

## 2023-05-24 ENCOUNTER — Other Ambulatory Visit: Payer: Self-pay | Admitting: Internal Medicine

## 2023-05-24 DIAGNOSIS — F411 Generalized anxiety disorder: Secondary | ICD-10-CM

## 2023-05-24 DIAGNOSIS — F10982 Alcohol use, unspecified with alcohol-induced sleep disorder: Secondary | ICD-10-CM

## 2023-05-31 ENCOUNTER — Other Ambulatory Visit: Payer: Self-pay | Admitting: Internal Medicine

## 2023-06-27 ENCOUNTER — Encounter (HOSPITAL_COMMUNITY): Payer: Self-pay

## 2023-07-04 ENCOUNTER — Ambulatory Visit (INDEPENDENT_AMBULATORY_CARE_PROVIDER_SITE_OTHER): Payer: Medicare HMO | Admitting: Internal Medicine

## 2023-07-04 ENCOUNTER — Encounter: Payer: Self-pay | Admitting: Internal Medicine

## 2023-07-04 VITALS — BP 136/76 | HR 65 | Ht 71.0 in | Wt 234.0 lb

## 2023-07-04 DIAGNOSIS — I10 Essential (primary) hypertension: Secondary | ICD-10-CM | POA: Diagnosis not present

## 2023-07-04 DIAGNOSIS — F411 Generalized anxiety disorder: Secondary | ICD-10-CM | POA: Diagnosis not present

## 2023-07-04 DIAGNOSIS — F5104 Psychophysiologic insomnia: Secondary | ICD-10-CM

## 2023-07-04 DIAGNOSIS — I2510 Atherosclerotic heart disease of native coronary artery without angina pectoris: Secondary | ICD-10-CM

## 2023-07-04 DIAGNOSIS — Z8042 Family history of malignant neoplasm of prostate: Secondary | ICD-10-CM | POA: Insufficient documentation

## 2023-07-04 DIAGNOSIS — L409 Psoriasis, unspecified: Secondary | ICD-10-CM

## 2023-07-04 DIAGNOSIS — N528 Other male erectile dysfunction: Secondary | ICD-10-CM

## 2023-07-04 DIAGNOSIS — E66811 Obesity, class 1: Secondary | ICD-10-CM

## 2023-07-04 DIAGNOSIS — R7303 Prediabetes: Secondary | ICD-10-CM

## 2023-07-04 DIAGNOSIS — Z1159 Encounter for screening for other viral diseases: Secondary | ICD-10-CM

## 2023-07-04 DIAGNOSIS — K219 Gastro-esophageal reflux disease without esophagitis: Secondary | ICD-10-CM | POA: Diagnosis not present

## 2023-07-04 DIAGNOSIS — Z1283 Encounter for screening for malignant neoplasm of skin: Secondary | ICD-10-CM

## 2023-07-04 DIAGNOSIS — E785 Hyperlipidemia, unspecified: Secondary | ICD-10-CM

## 2023-07-04 DIAGNOSIS — Z125 Encounter for screening for malignant neoplasm of prostate: Secondary | ICD-10-CM

## 2023-07-04 NOTE — Progress Notes (Signed)
 Established Patient Office Visit  Subjective   Patient ID: Roger Powers, male    DOB: 12/10/1955  Age: 68 y.o. MRN: [id number]  Chief Complaint  Patient presents with   Care Management    Six month follow up    Roger Powers returns to care today for routine follow-up.  He was last evaluated by me in November 2024 as a new patient presenting to establish care.  No medication changes were made at that time and 84-month follow-up was arranged.  In the interim he was seen by cardiology for follow-up in January and was evaluated by neurology in February in the setting of an episode of altered mental status with concern for possible seizure.  He underwent EEG on 3/4 that was normal.  MRI brain did not show any acute abnormalities to explain the episode.  MRA head showed atherosclerosis with nothing meeting criteria for intervention.  There have otherwise been no acute interval events. Roger Powers reports feeling well today.  He requests a referral to dermatology in the setting of psoriasis and wanting a general skin examination.  He has also requested a referral to urology in the setting of the ED and a family history significant for prostate cancer in multiple men.  Past Medical History:  Diagnosis Date   CAD (coronary artery disease)    02/12/22: LHC DES 2.50x16 to midLAD, residual dz tx medically   Cataract    RIGHT eye only   GERD (gastroesophageal reflux disease)    hx of- with certain foods- on meds   Hypercholesteremia    on meds   Hypertension    on meds   Psoriasis    Past Surgical History:  Procedure Laterality Date   CATARACT EXTRACTION W/ INTRAOCULAR LENS IMPLANT Right 2013   COLONOSCOPY  2004   patient cannot remember where/MD   CORONARY STENT INTERVENTION N/A 02/12/2022   Procedure: CORONARY STENT INTERVENTION;  Surgeon: Lucendia Rusk, MD;  Location: [facility] INVASIVE CV LAB;  Service: Cardiovascular;  Laterality: N/A;   LEFT HEART CATH AND CORONARY ANGIOGRAPHY N/A 02/12/2022    Procedure: LEFT HEART CATH AND CORONARY ANGIOGRAPHY;  Surgeon: Lucendia Rusk, MD;  [facility] INVASIVE CV LAB;  Service: Cardiovascular;  Laterality: N/A;   RETINAL DETACHMENT SURGERY Right 2012   WISDOM TOOTH EXTRACTION     Social History   Tobacco Use   Smoking status: Former    Current packs/day: 0.00    Types: Cigarettes    Quit date: 11/18/1979    Years since quitting: 43.6   Smokeless tobacco: Former  Building services engineer status: Never Used  Substance Use Topics   Alcohol use: Yes    Alcohol/week: 2.0 standard drinks of alcohol    Types: 2 Standard drinks or equivalent per week    Comment: rare   Drug use: No   Family History  Problem Relation Age of Onset   Breast cancer Mother    Diabetes Mother    Lung cancer Mother    Heart disease Father    Heart disease Brother    Heart disease Brother    Colon polyps Neg Hx    Colon cancer Neg Hx    Esophageal cancer Neg Hx    Stomach cancer Neg Hx    Rectal cancer Neg Hx    No Known Allergies  Review of Systems  Constitutional:  Negative for chills and fever.  HENT:  Negative for sore throat.   Respiratory:  Negative for cough  and shortness of breath.   Cardiovascular:  Negative for chest pain, palpitations and leg swelling.  Gastrointestinal:  Negative for abdominal pain, blood in stool, constipation, diarrhea, nausea and vomiting.  Genitourinary:  Negative for dysuria and hematuria.  Musculoskeletal:  Negative for myalgias.  Skin:  Negative for itching and rash.  Neurological:  Negative for dizziness and headaches.  Psychiatric/Behavioral:  Negative for depression and suicidal ideas.      Objective:     BP 136/76   Pulse 65   Ht 5\' 11"  (1.803 m)   Wt 234 lb (106.1 kg)   SpO2 93%   BMI 32.64 kg/m  BP Readings from Last 3 Encounters:  07/04/23 136/76  04/12/23 (!) 140/80  03/29/23 (!) 148/78   Physical Exam Vitals reviewed.  Constitutional:      General: He is not in acute distress.     Appearance: Normal appearance. He is obese. He is not ill-appearing.  HENT:     Head: Normocephalic and atraumatic.     Right Ear: External ear normal.     Left Ear: External ear normal.     Nose: Nose normal. No congestion or rhinorrhea.     Mouth/Throat:     Mouth: Mucous membranes are moist.     Pharynx: Oropharynx is clear.  Eyes:     General: No scleral icterus.    Extraocular Movements: Extraocular movements intact.     Conjunctiva/sclera: Conjunctivae normal.     Pupils: Pupils are equal, round, and reactive to light.  Cardiovascular:     Rate and Rhythm: Normal rate and regular rhythm.     Pulses: Normal pulses.     Heart sounds: Normal heart sounds. No murmur heard. Pulmonary:     Effort: Pulmonary effort is normal.     Breath sounds: Normal breath sounds. No wheezing, rhonchi or rales.  Abdominal:     General: Abdomen is flat. Bowel sounds are normal. There is no distension.     Palpations: Abdomen is soft.     Tenderness: There is no abdominal tenderness.  Musculoskeletal:        General: No swelling or deformity. Normal range of motion.     Cervical back: Normal range of motion.  Skin:    General: Skin is warm and dry.     Capillary Refill: Capillary refill takes less than 2 seconds.  Neurological:     General: No focal deficit present.     Mental Status: He is alert and oriented to person, place, and time.     Motor: No weakness.  Psychiatric:        Mood and Affect: Mood normal.        Behavior: Behavior normal.        Thought Content: Thought content normal.   Last CBC Lab Results  Component Value Date   WBC 9.0 11/16/2022   HGB 15.4 11/16/2022   HCT 45.7 11/16/2022   MCV 95.2 11/16/2022   MCH 32.1 11/16/2022   RDW 13.0 11/16/2022   PLT 285 11/16/2022   Last metabolic panel Lab Results  Component Value Date   GLUCOSE 145 (H) 11/16/2022   NA 139 11/16/2022   K 3.9 11/16/2022   CL 104 11/16/2022   CO2 22 11/16/2022   BUN 16 11/16/2022    CREATININE 0.97 11/16/2022   GFRNONAA >60 11/16/2022   CALCIUM  9.3 11/16/2022   PROT 7.0 11/16/2022   ALBUMIN 4.2 11/16/2022   BILITOT 1.0 11/16/2022   ALKPHOS 62 11/16/2022  AST 27 11/16/2022   ALT 39 11/16/2022   ANIONGAP 13 11/16/2022   Last lipids Lab Results  Component Value Date   CHOL 107 05/12/2022   HDL 28 (L) 05/12/2022   LDLCALC 41 05/12/2022   TRIG 363 (H) 05/12/2022   CHOLHDL 3.8 05/12/2022   Last hemoglobin A1c Lab Results  Component Value Date   HGBA1C 5.9 (H) 10/05/2022   Last vitamin D  Lab Results  Component Value Date   VD25OH 33.8 10/05/2022     Assessment & Plan:   Problem List Items Addressed This Visit       Hypertension   Adequately controlled on current antihypertensive regimen.  No medication changes are indicated today.      CAD (coronary artery disease)   Seen by cardiology for follow-up in January.  Denies recent chest pain.  He is currently prescribed Plavix , atorvastatin , and Toprol -XL.      GERD (gastroesophageal reflux disease)   Symptoms remain adequately controlled with pantoprazole .      Psoriasis - Primary   Dermatology referral placed today patient request      Erectile dysfunction   Symptoms are typically controlled with as needed use of tadalafil.  Urology referral placed today at patient request.      Generalized anxiety disorder   Remains adequately controlled with clonazepam  1 mg daily as needed for anxiety relief.  PDMP reviewed and is appropriate.      Insomnia   Remains adequately controlled with nightly use of Ambien .      Family history of prostate cancer   Screening PSA ordered today.  I have also placed a referral to urology at the patient's request.      Return in about 6 months (around 01/04/2024).   Tobi Fortes, MD

## 2023-07-04 NOTE — Assessment & Plan Note (Signed)
 Remains adequately controlled with clonazepam  1 mg daily as needed for anxiety relief.  PDMP reviewed and is appropriate.

## 2023-07-04 NOTE — Assessment & Plan Note (Signed)
 Seen by cardiology for follow-up in January.  Denies recent chest pain.  He is currently prescribed Plavix , atorvastatin , and Toprol -XL.

## 2023-07-04 NOTE — Assessment & Plan Note (Signed)
 Remains adequately controlled with nightly use of Ambien .

## 2023-07-04 NOTE — Assessment & Plan Note (Signed)
 Symptoms remain adequately controlled with pantoprazole

## 2023-07-04 NOTE — Assessment & Plan Note (Signed)
 Screening PSA ordered today.  I have also placed a referral to urology at the patient's request.

## 2023-07-04 NOTE — Assessment & Plan Note (Signed)
 Dermatology referral placed today patient request

## 2023-07-04 NOTE — Assessment & Plan Note (Signed)
 Symptoms are typically controlled with as needed use of tadalafil.  Urology referral placed today at patient request.

## 2023-07-04 NOTE — Patient Instructions (Signed)
 It was a pleasure to see you today.  Thank you for giving us  the opportunity to be involved in your care.  Below is a brief recap of your visit and next steps.  We will plan to see you again in 6 months.  Summary No medication changes today Repeat labs ordered Referrals placed to dermatology and urology Follow up in 6 months

## 2023-07-04 NOTE — Assessment & Plan Note (Signed)
 Adequately controlled on current antihypertensive regimen.  No medication changes are indicated today.

## 2023-07-05 ENCOUNTER — Other Ambulatory Visit: Payer: Self-pay | Admitting: Internal Medicine

## 2023-07-05 ENCOUNTER — Ambulatory Visit: Payer: Self-pay | Admitting: Internal Medicine

## 2023-07-05 DIAGNOSIS — E875 Hyperkalemia: Secondary | ICD-10-CM

## 2023-07-05 LAB — CBC WITH DIFFERENTIAL/PLATELET
Basophils Absolute: 0.1 10*3/uL (ref 0.0–0.2)
Basos: 1 %
EOS (ABSOLUTE): 0.4 10*3/uL (ref 0.0–0.4)
Eos: 5 %
Hematocrit: 47.1 % (ref 37.5–51.0)
Hemoglobin: 15.1 g/dL (ref 13.0–17.7)
Immature Grans (Abs): 0 10*3/uL (ref 0.0–0.1)
Immature Granulocytes: 0 %
Lymphocytes Absolute: 2.3 10*3/uL (ref 0.7–3.1)
Lymphs: 29 %
MCH: 31.1 pg (ref 26.6–33.0)
MCHC: 32.1 g/dL (ref 31.5–35.7)
MCV: 97 fL (ref 79–97)
Monocytes Absolute: 0.8 10*3/uL (ref 0.1–0.9)
Monocytes: 9 %
Neutrophils Absolute: 4.5 10*3/uL (ref 1.4–7.0)
Neutrophils: 56 %
Platelets: 291 10*3/uL (ref 150–450)
RBC: 4.85 x10E6/uL (ref 4.14–5.80)
RDW: 13.1 % (ref 11.6–15.4)
WBC: 8.1 10*3/uL (ref 3.4–10.8)

## 2023-07-05 LAB — B12 AND FOLATE PANEL
Folate: 10.2 ng/mL (ref >3.0)
Vitamin B-12: 662 pg/mL (ref 232–1245)

## 2023-07-05 LAB — CMP14+EGFR
ALT: 34 IU/L (ref 0–44)
AST: 25 IU/L (ref 0–40)
Albumin: 4.6 g/dL (ref 3.9–4.9)
Alkaline Phosphatase: 79 IU/L (ref 44–121)
BUN/Creatinine Ratio: 15 (ref 10–24)
BUN: 16 mg/dL (ref 8–27)
Bilirubin Total: 0.5 mg/dL (ref 0.0–1.2)
CO2: 22 mmol/L (ref 20–29)
Calcium: 9.6 mg/dL (ref 8.6–10.2)
Chloride: 103 mmol/L (ref 96–106)
Creatinine, Ser: 1.08 mg/dL (ref 0.76–1.27)
Globulin, Total: 2.2 g/dL (ref 1.5–4.5)
Glucose: 98 mg/dL (ref 70–99)
Potassium: 5.5 mmol/L — ABNORMAL HIGH (ref 3.5–5.2)
Sodium: 139 mmol/L (ref 134–144)
Total Protein: 6.8 g/dL (ref 6.0–8.5)
eGFR: 74 mL/min/{1.73_m2} (ref >59)

## 2023-07-05 LAB — LIPID PANEL
Chol/HDL Ratio: 4.5 ratio (ref 0.0–5.0)
Cholesterol, Total: 125 mg/dL (ref 100–199)
HDL: 28 mg/dL — ABNORMAL LOW (ref >39)
LDL Chol Calc (NIH): 71 mg/dL (ref 0–99)
Triglycerides: 151 mg/dL — ABNORMAL HIGH (ref 0–149)
VLDL Cholesterol Cal: 26 mg/dL (ref 5–40)

## 2023-07-05 LAB — HCV INTERPRETATION

## 2023-07-05 LAB — HEMOGLOBIN A1C
Est. average glucose Bld gHb Est-mCnc: 123 mg/dL
Hgb A1c MFr Bld: 5.9 % — ABNORMAL HIGH (ref 4.8–5.6)

## 2023-07-05 LAB — HCV AB W REFLEX TO QUANT PCR: HCV Ab: NONREACTIVE

## 2023-07-05 LAB — VITAMIN D 25 HYDROXY (VIT D DEFICIENCY, FRACTURES): Vit D, 25-Hydroxy: 35.9 ng/mL (ref 30.0–100.0)

## 2023-07-05 LAB — PSA: Prostate Specific Ag, Serum: 0.4 ng/mL (ref 0.0–4.0)

## 2023-07-05 LAB — TSH+FREE T4
Free T4: 1.04 ng/dL (ref 0.82–1.77)
TSH: 1.99 u[IU]/mL (ref 0.450–4.500)

## 2023-07-13 DIAGNOSIS — E875 Hyperkalemia: Secondary | ICD-10-CM | POA: Diagnosis not present

## 2023-07-14 ENCOUNTER — Ambulatory Visit: Payer: Self-pay | Admitting: Internal Medicine

## 2023-07-14 DIAGNOSIS — E875 Hyperkalemia: Secondary | ICD-10-CM

## 2023-07-14 LAB — BASIC METABOLIC PANEL WITH GFR
BUN/Creatinine Ratio: 14 (ref 10–24)
BUN: 14 mg/dL (ref 8–27)
CO2: 21 mmol/L (ref 20–29)
Calcium: 9.5 mg/dL (ref 8.6–10.2)
Chloride: 105 mmol/L (ref 96–106)
Creatinine, Ser: 1.03 mg/dL (ref 0.76–1.27)
Glucose: 112 mg/dL — ABNORMAL HIGH (ref 70–99)
Potassium: 5.6 mmol/L — ABNORMAL HIGH (ref 3.5–5.2)
Sodium: 141 mmol/L (ref 134–144)
eGFR: 79 mL/min/{1.73_m2} (ref >59)

## 2023-07-14 MED ORDER — LOKELMA 10 G PO PACK: 10.0000 g | PACK | Freq: Three times a day (TID) | ORAL | 0 refills | Status: AC

## 2023-07-20 ENCOUNTER — Telehealth: Payer: Self-pay | Admitting: Pharmacy Technician

## 2023-07-20 ENCOUNTER — Other Ambulatory Visit (HOSPITAL_COMMUNITY): Payer: Self-pay

## 2023-07-20 NOTE — Telephone Encounter (Signed)
 Pharmacy Patient Advocate Encounter  Received notification from [facility] that Prior Authorization for Lokelma  10GM packets has been APPROVED from 07/20/2023 to 02/15/2024. Ran test claim, Copay is $42.31. This test claim was processed through [facility]- copay amounts may vary at other pharmacies due to pharmacy/plan contracts, or as the patient moves through the different stages of their insurance plan.   PA #/Case ID/Reference #: [id number]

## 2023-07-20 NOTE — Telephone Encounter (Signed)
 Pharmacy Patient Advocate Encounter   Received notification from CoverMyMeds that prior authorization for Lokelma  10GM packets is required/requested.   Insurance verification completed.   The patient is insured through [facility] .   Per test claim: PA required; PA submitted to above mentioned insurance via CoverMyMeds Key/confirmation #/EOC [id number] Status is pending

## 2023-07-28 DIAGNOSIS — E875 Hyperkalemia: Secondary | ICD-10-CM | POA: Diagnosis not present

## 2023-08-06 NOTE — Progress Notes (Unsigned)
 Cardiology Office Note   Date:  08/11/2023   ID:  Powers, Roger 06/15/1955, MRN [id number]  PCP:  No primary care provider on file.  Cardiologist:   Roger Gull, MD   Pt returns for f/u of CP and CAD      History of Present Illness: Roger Powers is a 68 y.o. male with a history of CAD    2018  Ca score  was 339   Aorta 43 mm     2021  Stress myoview   showed no schemia Dec 2023  Coronary CTA showed signif stensosis in LAD   REcomm LHC which showed 75% LAD stenosis in mid LAD   Underwent PCI/DES to mid LAD    Plan for ASA / Plavix  for at least 6 months   I saw the pt in fall 2024   Nov 2024  The pt went into bedroom to get ready for bath   Wife found him slumped in chair  She says he was pounding on chest    Pt does not remember anything    Wife says he was unresponsive for a bit   Then wok up      He was seen in cardiology by Roger Powers   Zio patch showed no arrhythmia Myoview  scan showed question basal inferolateral ischemia   I did not think signficant He was also seen in neuro  MRI showed small remote infarct in L thalamust   Atherosclerosis,   The pt has not been seen back in neuro  Since then he has had no further LOC  He says his energy is not what it was afte his PTCA in 2023   He says he is more fatgiued, like he was preintervention.   He never had significant CP     Br:  Yogurt vanilla with nuts  Lunch   Drive through occasional  Otherwise  Civil engineer, contracting and veggies, hummus Crackers occasionally  Unsweet tea  He admits weight is up 10 lbs   Current Meds  Medication Sig   atorvastatin  (LIPITOR) 80 MG tablet Take 1 tablet (80 mg total) by mouth daily.   clopidogrel  (PLAVIX ) 75 MG tablet TAKE ONE TABLET (75MG  TOTAL) BY MOUTH DAILY WITH BREAKFAST   ezetimibe  (ZETIA ) 10 MG tablet Take 1 tablet (10 mg total) by mouth daily.   hydrochlorothiazide  (MICROZIDE ) 12.5 MG capsule Take 1 capsule (12.5 mg total) by mouth daily.   lisinopril  (ZESTRIL ) 40 MG tablet TAKE ONE TABLET  (40MG  TOTAL) BY MOUTH DAILY   metoprolol  succinate (TOPROL -XL) 25 MG 24 hr tablet TAKE ONE TABLET (25MG  TOTAL) BY MOUTH DAILY   Multiple Vitamins-Minerals (MULTIVITAMIN MEN 50+ PO) Take 1 tablet by mouth daily.   nitroGLYCERIN  (NITROSTAT ) 0.4 MG SL tablet Place 1 tablet (0.4 mg total) under the tongue every 5 (five) minutes as needed for chest pain.   pantoprazole  (PROTONIX ) 40 MG tablet Take 1 tablet (40 mg total) by mouth daily.   tadalafil (CIALIS) 20 MG tablet Take 20 mg by mouth daily as needed for erectile dysfunction.   zolpidem  (AMBIEN ) 10 MG tablet TAKE ONE TABLET BY MOUTH EACH NIGHT AT BEDTIME FOR SLEEP   [DISCONTINUED] nitroGLYCERIN  (NITROSTAT ) 0.4 MG SL tablet Place 1 tablet (0.4 mg total) under the tongue every 5 (five) minutes as needed for chest pain.     Allergies:   Patient has no known allergies.   Past Medical History:  Diagnosis Date   CAD (coronary artery disease)    02/12/22:  LHC DES 2.50x16 to midLAD, residual dz tx medically   Cataract    RIGHT eye only   GERD (gastroesophageal reflux disease)    hx of- with certain foods- on meds   Hypercholesteremia    on meds   Hyperkalemia    Hypertension    on meds   Psoriasis     Past Surgical History:  Procedure Laterality Date   CATARACT EXTRACTION W/ INTRAOCULAR LENS IMPLANT Right 2013   COLONOSCOPY  2004   patient cannot remember where/MD   CORONARY STENT INTERVENTION N/A 02/12/2022   Procedure: CORONARY STENT INTERVENTION;  Surgeon: Dann Candyce RAMAN, MD;  Location: [facility] INVASIVE CV LAB;  Service: Cardiovascular;  Laterality: N/A;   LEFT HEART CATH AND CORONARY ANGIOGRAPHY N/A 02/12/2022   Procedure: LEFT HEART CATH AND CORONARY ANGIOGRAPHY;  Surgeon: Dann Candyce RAMAN, MD;  Location: [facility] INVASIVE CV LAB;  Service: Cardiovascular;  Laterality: N/A;   RETINAL DETACHMENT SURGERY Right 2012   WISDOM TOOTH EXTRACTION       Social History:  The patient  reports that he quit smoking about 43 years ago. His  smoking use included cigarettes. He has quit using smokeless tobacco. He reports current alcohol use of about 2.0 standard drinks of alcohol per week. He reports that he does not use drugs.   Family History:  The patient's family history includes Breast cancer in his mother; Diabetes in his mother; Heart disease in his brother, brother, and father; Lung cancer in his mother.    ROS:  Please see the history of present illness. All other systems are reviewed and  Negative to the above problem except as noted.    PHYSICAL EXAM: VS:  BP (!) 143/84 (BP Location: Right Arm, Cuff Size: Large)   Pulse 76   Ht 5' 11 (1.803 m)   Wt 236 lb (107 kg)   SpO2 97%   BMI 32.92 kg/m   GEN: Overweight 68 yo  in no acute distress  HEENT: normal  Neck: JVP normal;  Cardiac: RRR; No murmurs   No LE  edema  Respiratory:  clear to auscultation  GI: soft, nontender, nondistended   No hepatomegaly    EKG:  EKG not done   Echo  Jan 2024  1. Left ventricular ejection fraction, by estimation, is 65 to 70%. The  left ventricle has normal function. The left ventricle has no regional  wall motion abnormalities. There is moderate asymmetric left ventricular  hypertrophy of the basal-septal  segment. Left ventricular diastolic parameters are indeterminate. Elevated  left atrial pressure. The average left ventricular global longitudinal  strain is -19.9 %. The global longitudinal strain is normal.   2. Right ventricular systolic function is normal. The right ventricular  size is normal.   3. The mitral valve is normal in structure. No evidence of mitral valve  regurgitation. No evidence of mitral stenosis.   4. The aortic valve is tricuspid. There is mild calcification of the  aortic valve. There is mild thickening of the aortic valve. Aortic valve  regurgitation is not visualized. No aortic stenosis is present.   5. The inferior vena cava is normal in size with greater than 50%  respiratory variability,  suggesting right atrial pressure of 3 mmHg.    LHC   Dec 2024    Prox RCA lesion is 25% stenosed.   RPAV lesion is 50% stenosed.   Mid LAD-1 lesion is 25% stenosed.   Mid LAD-2 lesion is 40% stenosed.   Mid LAD-3  lesion is 75% stenosed.  A drug-eluting stent was successfully placed using a SYNERGY XD 2.50X16, postdilated to greater than 2.75 mm.   Post intervention, there is a 0% residual stenosis.   The left ventricular systolic function is normal.   LV end diastolic pressure is normal.  LVEDP 8 mmHg.   The left ventricular ejection fraction is 55-65% by visual estimate.   There is no aortic valve stenosis.   There was tortuosity in the right subclavian making catheter manipulation somewhat difficult.  There was right radial spasm.   Successful PCI of the mid LAD.  Mild to moderate disease elsewhere in the coronary tree.  Continue aggressive preventive therapy along with dual antiplatelet therapy for at least 6 months.  After 6 months, would consider clopidogrel  monotherapy going forward given his diffuse atherosclerosis and calcification.  CT Dec 2024   Coronary calcium  score is 847, which places the patient in the 85th percentile for age and sex matched control.   Coronary arteries: Normal coronary origins.  Right dominance.   Right Coronary Artery: Severe mixed atherosclerotic plaque in the proximal RCA, 70-99% stenosis. Mild mixed atherosclerotic plaque in the mid RCA with low attenuation plaque, 25-49% stenosis. Mild scattered plaque in the distal RCA, PDA and PL, 25-49% stenosis.   Left Main Coronary Artery: Minimal distal LM mixed atherosclerotic plaque, <25% stenosis.   Left Anterior Descending Coronary Artery: Mild mixed atherosclerotic plaque in the proximal LAD, 25-49% stenosis. Moderate mixed atherosclerotic plaque in the mid LAD at the level of the D1 bifurcation, 50-69% stenosis. Minimal ostial plaque in D1, <25% stenosis. Severe mixed atherosclerotic plaque in  the mid LAD just distal to the D1 takeoff, 70-99% stenosis, and a serial severe stenosis 70-99% stenosis in the mid LAD. Distal vessel has mild scattered disease.   Ramus intermedius: Mild mixed atherosclerotic plaque in the mid RI, 25-49%.   Left Circumflex Artery: Mild mixed atherosclerotic plaque in the proximal LCx, 25-49%.   Aorta: Mild dilation, 42 x 40 x 39 mm at sinus of Valsalva, 40 mm at the mid ascending aorta (level of the PA bifurcation) measured double oblique.   Aortic Valve: No calcifications.   Other findings:   Normal pulmonary vein drainage into the left atrium.   Normal left atrial appendage without thrombus.   Mild dilation of main and branch pulmonary arteries   Severe basal septal hypertrophy, 16-17 mm.   Please see separate report from [facility] for non-cardiac findings.   IMPRESSION: 1. Severe CAD in the proximal RCA and mid LAD, 70-99% stenosis, CADRADS 4. CT FFR will be performed and reported separately.   2. Coronary calcium  score is 847, which places the patient in the 85th percentile for age and sex matched control.   3. Normal coronary origins with right dominance.   4. Severe basal septal hypertrophy, 16-17 mm.   5. Mild dilation of main and branch pulmonary arteries, which may indicate increased pulmonary pressures.   6. Mild dilation of ascending aorta, 42 mm at sinus of Valsalva, 40 mm at mid ascending aorta.   Lipid Panel    Component Value Date/Time   CHOL 125 07/04/2023 1120   TRIG 151 (H) 07/04/2023 1120   HDL 28 (L) 07/04/2023 1120   CHOLHDL 4.5 07/04/2023 1120   CHOLHDL 3.8 05/12/2022 0707   VLDL 37 (H) 09/29/2016 0737   LDLCALC 71 07/04/2023 1120   LDLCALC 41 05/12/2022 0707      Wt Readings from Last 3 Encounters:  08/11/23 236 lb (  107 kg)  07/04/23 234 lb (106.1 kg)  04/12/23 220 lb (99.8 kg)      ASSESSMENT AND PLAN:  1  CAD  Ca score 847  Pt s/p cath with intervention to LAD  in 2023    Myoview  Nov 2024 showed no sigfnicant ischemia on my review   He denies CP, never had CP even before first intervention He does note fatigue  ? If related to weight    WOrk on diet    If symptoms worsen, do not resolve, consider further ischemic evaluation I have asked the pt to respond via my Chart  2  Syncope   Pt with spell in Nov 2024 of unresponsiveness   Monitor without arrhythmia  Myoview  without signfiicant ischemia Pt seen in neuro      3  HTN  Pt is on lisinopril  40,metoprolol  25 and hydrochlorothiazide  12.5    BP is a little high    WOrk with weight loss for now    4  Aortic dilation  42 mm on CT in Dec 2023  Follow periodically      5   HL   In May 2025 LDL 71, HDL 28  Trig 151  Keep on same meds  Work on diet   6  Metabolics  A1C 5.9   Reviewed diet Limit carbs   Intermitt fasting   Follow     Current medicines are reviewed at length with the patient today.  The patient does not have concerns regarding medicines.  Signed, Roger Gull, MD  08/11/2023 11:01 PM    [facility] [address] [place], [place], [place]  [address] Phone: [phone]; Fax: [phone]

## 2023-08-11 ENCOUNTER — Encounter: Payer: Self-pay | Admitting: Internal Medicine

## 2023-08-11 ENCOUNTER — Ambulatory Visit: Payer: Medicare HMO | Attending: Internal Medicine | Admitting: Internal Medicine

## 2023-08-11 VITALS — BP 143/84 | HR 76 | Ht 71.0 in | Wt 236.0 lb

## 2023-08-11 DIAGNOSIS — I2510 Atherosclerotic heart disease of native coronary artery without angina pectoris: Secondary | ICD-10-CM | POA: Diagnosis not present

## 2023-08-11 MED ORDER — NITROGLYCERIN 0.4 MG SL SUBL: 0.4000 mg | SUBLINGUAL_TABLET | SUBLINGUAL | 3 refills | Status: AC | PRN

## 2023-08-11 NOTE — Patient Instructions (Signed)
Medication Instructions:  Your physician recommends that you continue on your current medications as directed. Please refer to the Current Medication list given to you today.   Labwork: None today  Testing/Procedures: None today  Follow-Up: To be determined  Any Other Special Instructions Will Be Listed Below (If Applicable).  If you need a refill on your cardiac medications before your next appointment, please call your pharmacy.

## 2023-08-16 ENCOUNTER — Other Ambulatory Visit: Payer: Self-pay | Admitting: Internal Medicine

## 2023-08-17 ENCOUNTER — Telehealth: Payer: Self-pay

## 2023-08-17 NOTE — Telephone Encounter (Signed)
 Patient saw Dr Melvenia, but this medicine was given by his provider Dr Knowlton before retiring. Needs a refill. Patient is completely out.  Prescription Request  08/17/2023  LOV: Visit date not found  What is the name of the medication or equipment? tadalafil (CIALIS) 20 MG tablet [[id number]]   Have you contacted your pharmacy to request a refill? Yes   Which pharmacy would you like this sent to?  [facility]   Patient notified that their request is being sent to the clinical staff for review and that they should receive a response within 2 business days.   Please advise at patient walked into office

## 2023-08-21 ENCOUNTER — Other Ambulatory Visit: Payer: Self-pay

## 2023-08-21 MED ORDER — TADALAFIL 20 MG PO TABS: 20.0000 mg | ORAL_TABLET | Freq: Every day | ORAL | 5 refills | Status: DC | PRN

## 2023-08-24 ENCOUNTER — Ambulatory Visit: Admitting: Urology

## 2023-08-24 ENCOUNTER — Encounter: Payer: Self-pay | Admitting: Urology

## 2023-08-24 VITALS — BP 124/67 | HR 69

## 2023-08-24 DIAGNOSIS — Z125 Encounter for screening for malignant neoplasm of prostate: Secondary | ICD-10-CM

## 2023-08-24 DIAGNOSIS — N5201 Erectile dysfunction due to arterial insufficiency: Secondary | ICD-10-CM

## 2023-08-24 LAB — URINALYSIS, ROUTINE W REFLEX MICROSCOPIC
Bilirubin, UA: NEGATIVE
Glucose, UA: NEGATIVE
Ketones, UA: NEGATIVE
Leukocytes,UA: NEGATIVE
Nitrite, UA: NEGATIVE
Protein,UA: NEGATIVE
RBC, UA: NEGATIVE
Specific Gravity, UA: 1.03 (ref 1.005–1.030)
Urobilinogen, Ur: 0.2 mg/dL (ref 0.2–1.0)
pH, UA: 6 (ref 5.0–7.5)

## 2023-08-24 MED ORDER — TADALAFIL 20 MG PO TABS: 20.0000 mg | ORAL_TABLET | Freq: Every day | ORAL | 5 refills | Status: DC | PRN

## 2023-08-24 NOTE — Progress Notes (Signed)
 08/24/2023 2:03 PM   Roger Powers Oct 25, 1955 [id number]  Referring provider: Melvenia Manus BRAVO, MD [address] [place],  [place] [address]  Erectile dysfunction  HPI: Mr Roger Powers is a 68yo here for evaluation of erectile dysfunction.  IPSS 6 QOL 2 on no BPH medication. Nocturia 2x. Urine stream strong. No straining to urinate. PSA this year was 0.4. His father, grandfather, 2 brothers all had prostate cancer. He uses tadalafil  20mg  prn with mixed results. He gets a semifirm erection He has tried sildenafil prn with poor results.    PMH: Past Medical History:  Diagnosis Date   CAD (coronary artery disease)    02/12/22: LHC DES 2.50x16 to midLAD, residual dz tx medically   Cataract    RIGHT eye only   GERD (gastroesophageal reflux disease)    hx of- with certain foods- on meds   Hypercholesteremia    on meds   Hyperkalemia    Hypertension    on meds   Psoriasis     Surgical History: Past Surgical History:  Procedure Laterality Date   CATARACT EXTRACTION W/ INTRAOCULAR LENS IMPLANT Right 2013   COLONOSCOPY  2004   patient cannot remember where/MD   CORONARY STENT INTERVENTION N/A 02/12/2022   Procedure: CORONARY STENT INTERVENTION;  Surgeon: Dann Candyce RAMAN, MD;  Location: [facility] INVASIVE CV LAB;  Service: Cardiovascular;  Laterality: N/A;   LEFT HEART CATH AND CORONARY ANGIOGRAPHY N/A 02/12/2022   Procedure: LEFT HEART CATH AND CORONARY ANGIOGRAPHY;  Surgeon: Dann Candyce RAMAN, MD;  Location: [facility] INVASIVE CV LAB;  Service: Cardiovascular;  Laterality: N/A;   RETINAL DETACHMENT SURGERY Right 2012   WISDOM TOOTH EXTRACTION      Home Medications:  Allergies as of 08/24/2023   No Known Allergies      Medication List        Accurate as of August 24, 2023  2:03 PM. If you have any questions, ask your nurse or doctor.          atorvastatin  80 MG tablet Commonly known as: LIPITOR TAKE ONE TABLET (80MG  TOTAL) BY MOUTH DAILY   Cholecalciferol  100 MCG (4000 UT)  Caps Take 1 capsule (4,000 Units total) by mouth daily.   clobetasol  0.05 % external solution Commonly known as: TEMOVATE  Apply 1 Application topically daily as needed (psoriasis).   clopidogrel  75 MG tablet Commonly known as: PLAVIX  TAKE ONE TABLET (75MG  TOTAL) BY MOUTH DAILY WITH BREAKFAST   ezetimibe  10 MG tablet Commonly known as: ZETIA  Take 1 tablet (10 mg total) by mouth daily.   hydrochlorothiazide  12.5 MG capsule Commonly known as: MICROZIDE  Take 1 capsule (12.5 mg total) by mouth daily.   lisinopril  40 MG tablet Commonly known as: ZESTRIL  TAKE ONE TABLET (40MG  TOTAL) BY MOUTH DAILY   metoprolol  succinate 25 MG 24 hr tablet Commonly known as: TOPROL -XL TAKE ONE TABLET (25MG  TOTAL) BY MOUTH DAILY   MULTIVITAMIN MEN 50+ PO Take 1 tablet by mouth daily.   nitroGLYCERIN  0.4 MG SL tablet Commonly known as: Nitrostat  Place 1 tablet (0.4 mg total) under the tongue every 5 (five) minutes as needed for chest pain.   pantoprazole  40 MG tablet Commonly known as: PROTONIX  Take 1 tablet (40 mg total) by mouth daily.   tadalafil  20 MG tablet Commonly known as: CIALIS  Take 1 tablet (20 mg total) by mouth daily as needed for erectile dysfunction.   zolpidem  10 MG tablet Commonly known as: AMBIEN  TAKE ONE TABLET BY MOUTH EACH NIGHT AT BEDTIME  FOR SLEEP        Allergies: No Known Allergies  Family History: Family History  Problem Relation Age of Onset   Breast cancer Mother    Diabetes Mother    Lung cancer Mother    Heart disease Father    Heart disease Brother    Heart disease Brother    Colon polyps Neg Hx    Colon cancer Neg Hx    Esophageal cancer Neg Hx    Stomach cancer Neg Hx    Rectal cancer Neg Hx     Social History:  reports that he quit smoking about 43 years ago. His smoking use included cigarettes. He has quit using smokeless tobacco. He reports current alcohol use of about 2.0 standard drinks of alcohol per week. He reports that he does not use  drugs.  ROS: All other review of systems were reviewed and are negative except what is noted above in HPI  Physical Exam: BP 124/67   Pulse 69   Constitutional:  Alert and oriented, No acute distress. HEENT: [place] AT, moist mucus membranes.  Trachea midline, no masses. Cardiovascular: No clubbing, cyanosis, or edema. Respiratory: Normal respiratory effort, no increased work of breathing. GI: Abdomen is soft, nontender, nondistended, no abdominal masses GU: No CVA tenderness. Prostate 25g smooth, no nodules Lymph: No cervical or inguinal lymphadenopathy. Skin: No rashes, bruises or suspicious lesions. Neurologic: Grossly intact, no focal deficits, moving all 4 extremities. Psychiatric: Normal mood and affect.  Laboratory Data: Lab Results  Component Value Date   WBC 8.1 07/04/2023   HGB 15.1 07/04/2023   HCT 47.1 07/04/2023   MCV 97 07/04/2023   PLT 291 07/04/2023    Lab Results  Component Value Date   CREATININE 0.95 07/28/2023    No results found for: PSA  No results found for: TESTOSTERONE   Lab Results  Component Value Date   HGBA1C 5.9 (H) 07/04/2023    Urinalysis No results found for: COLORURINE, APPEARANCEUR, LABSPEC, PHURINE, GLUCOSEU, HGBUR, BILIRUBINUR, KETONESUR, PROTEINUR, UROBILINOGEN, NITRITE, LEUKOCYTESUR  No results found for: LABMICR, WBCUA, RBCUA, LABEPIT, MUCUS, BACTERIA  Pertinent Imaging:  No results found for this or any previous visit.  No results found for this or any previous visit.  No results found for this or any previous visit.  No results found for this or any previous visit.  No results found for this or any previous visit.  No results found for this or any previous visit.  No results found for this or any previous visit.  No results found for this or any previous visit.   Assessment & Plan:    1. Erectile dysfunction due to arterial insufficiency (Primary) -tadalafil  troche 20mg   prn  2. Prostate cancer screening -followup 1 year with psa   No follow-ups on file.  Belvie Clara, MD  [facility] [place]

## 2023-08-24 NOTE — Patient Instructions (Signed)

## 2023-08-26 ENCOUNTER — Other Ambulatory Visit: Payer: Self-pay

## 2023-08-26 ENCOUNTER — Other Ambulatory Visit: Payer: Self-pay | Admitting: Internal Medicine

## 2023-08-26 MED ORDER — TADALAFIL 20 MG PO TABS: 20.0000 mg | ORAL_TABLET | Freq: Every day | ORAL | 5 refills | Status: DC | PRN

## 2023-09-01 ENCOUNTER — Other Ambulatory Visit: Payer: Self-pay

## 2023-09-13 ENCOUNTER — Other Ambulatory Visit: Payer: Self-pay | Admitting: Internal Medicine

## 2023-09-15 NOTE — Telephone Encounter (Signed)
 Med-Pantoprazole  had not been filled since 2018, ok to fill

## 2023-11-16 ENCOUNTER — Telehealth: Payer: Self-pay

## 2023-11-16 ENCOUNTER — Other Ambulatory Visit: Payer: Self-pay

## 2023-11-16 DIAGNOSIS — N5201 Erectile dysfunction due to arterial insufficiency: Secondary | ICD-10-CM

## 2023-11-16 MED ORDER — TADALAFIL 20 MG PO TABS: 20.0000 mg | ORAL_TABLET | Freq: Every day | ORAL | 3 refills | Status: AC | PRN

## 2023-11-16 NOTE — Telephone Encounter (Signed)
 Pt called in about a 90 day RX for tadalafil  troche. Verbal for Dr. Sherrilee to send in 90 day supply with 3 refills. Verbalized understanding

## 2024-01-02 ENCOUNTER — Telehealth: Payer: Self-pay | Admitting: Internal Medicine

## 2024-01-02 ENCOUNTER — Ambulatory Visit

## 2024-01-02 ENCOUNTER — Other Ambulatory Visit: Payer: Self-pay | Admitting: *Deleted

## 2024-01-02 VITALS — BP 143/78 | HR 73 | Ht 71.0 in | Wt 237.0 lb

## 2024-01-02 DIAGNOSIS — M25531 Pain in right wrist: Secondary | ICD-10-CM

## 2024-01-02 DIAGNOSIS — E785 Hyperlipidemia, unspecified: Secondary | ICD-10-CM | POA: Diagnosis not present

## 2024-01-02 DIAGNOSIS — G8929 Other chronic pain: Secondary | ICD-10-CM

## 2024-01-02 DIAGNOSIS — Z125 Encounter for screening for malignant neoplasm of prostate: Secondary | ICD-10-CM

## 2024-01-02 DIAGNOSIS — R7303 Prediabetes: Secondary | ICD-10-CM | POA: Diagnosis not present

## 2024-01-02 DIAGNOSIS — I2510 Atherosclerotic heart disease of native coronary artery without angina pectoris: Secondary | ICD-10-CM

## 2024-01-02 DIAGNOSIS — E66811 Obesity, class 1: Secondary | ICD-10-CM | POA: Diagnosis not present

## 2024-01-02 DIAGNOSIS — F411 Generalized anxiety disorder: Secondary | ICD-10-CM

## 2024-01-02 DIAGNOSIS — I10 Essential (primary) hypertension: Secondary | ICD-10-CM

## 2024-01-02 DIAGNOSIS — R0602 Shortness of breath: Secondary | ICD-10-CM

## 2024-01-02 MED ORDER — CLONAZEPAM 1 MG PO TABS
1.0000 mg | ORAL_TABLET | Freq: Every day | ORAL | 0 refills | Status: AC
Start: 2024-01-02 — End: ?

## 2024-01-02 MED ORDER — HYDROCHLOROTHIAZIDE 12.5 MG PO CAPS: 12.5000 mg | ORAL_CAPSULE | Freq: Every day | ORAL | 3 refills | Status: AC

## 2024-01-02 NOTE — Telephone Encounter (Signed)
 Patient is aware that lab - BMP  has been placed for 10 days - at [facility]  Patient verbalized understanding.

## 2024-01-02 NOTE — Telephone Encounter (Signed)
 Pt c/o medication issue:  1. Name of Medication: hydrochlorothiazide  (MICROZIDE ) 12.5 MG capsule   2. How are you currently taking this medication (dosage and times per day)?  Take 1 capsule (12.5 mg total) by mouth daily.      3. Are you having a reaction (difficulty breathing--STAT)? No  4. What is your medication issue? Pt is requesting a callback regarding his pcp wanting him to start on this medication but he'd like to discuss with MD first since he was taken off of it and has concerns about his bp. Please advise

## 2024-01-02 NOTE — Progress Notes (Unsigned)
 Established Patient Office Visit  Subjective   Patient ID: Roger Powers, male    DOB: [birth year]  Age: 68 y.o. MRN: [id number]  Chief Complaint  Patient presents with   Medical Management of Chronic Issues    Follow up    HPI Discussed the use of AI scribe software for clinical note transcription with the patient, who gave verbal consent to proceed.  History of Present Illness    Roger Powers is a 68 year old male with hypertension who presents with concerns about fluctuating blood pressure and hand numbness.  Blood pressure fluctuations and hypertension - Fluctuating blood pressure, particularly elevated in the mornings with a decrease over weekends - Current antihypertensive regimen includes lisinopril  40 mg daily; uncertain about current use of hydrochlorothiazide  - Occasional use of Valium  when feeling unwell - No reported side effects from antihypertensive medications - Attributes some blood pressure issues to weight - Engages in exercise three days per week, including walking and bicycling, without significant impact on weight loss  Hand numbness and joint symptoms - Numbness in fingers and hands, described as 'going numb' - Presence of 'trigger finger'; fingers feel like 'rods' and sometimes 'pop' out of line - Numbness alleviated by moving hand to certain positions - No associated shoulder, neck, or elbow symptoms - Chiropractor suggested symptoms are not consistent with carpal tunnel syndrome - Pain and occasional swelling in hand joints, attributed to arthritis - Trial of topical treatments including Voltaren without significant relief  Electrolyte abnormalities - History of elevated potassium levels  Cancer screening concerns - Vigilant about cancer screenings, particularly for prostate cancer - Requests PSA test due to family history of cancer (mother had ovarian, lung, and colon cancer)  Cardiovascular history - History of stent placement - Aspirin   discontinued one year after stent placement     Patient Active Problem List   Diagnosis Date Noted   Obesity (BMI 30.0-34.9) 01/03/2024   Chronic pain of right wrist 01/03/2024   Psoriasis 07/04/2023   Family history of prostate cancer 07/04/2023   Prediabetes 01/04/2023   Erectile dysfunction 01/04/2023   GERD (gastroesophageal reflux disease) 01/04/2023   Generalized anxiety disorder 01/04/2023   Insomnia 01/04/2023   CAD (coronary artery disease) 02/12/2022   Hyperlipidemia with target LDL less than 70 02/12/2022   Trigger finger, left ring finger 05/16/2019   Aortic root enlargement 05/23/2011   Abnormal echocardiogram 05/23/2011   Hypertension 05/23/2011    ROS    Objective:     BP (!) 143/78   Pulse 73   Ht 5' 11 (1.803 m)   Wt 237 lb (107.5 kg)   SpO2 95%   BMI 33.05 kg/m  BP Readings from Last 3 Encounters:  01/02/24 (!) 143/78  08/24/23 124/67  08/11/23 (!) 143/84   Wt Readings from Last 3 Encounters:  01/02/24 237 lb (107.5 kg)  08/11/23 236 lb (107 kg)  07/04/23 234 lb (106.1 kg)     Physical Exam Vitals and nursing note reviewed.  Constitutional:      Appearance: Normal appearance.  HENT:     Head: Normocephalic.     Right Ear: Tympanic membrane, ear canal and external ear normal.     Left Ear: Tympanic membrane, ear canal and external ear normal.     Nose: Nose normal.     Mouth/Throat:     Mouth: Mucous membranes are moist.     Pharynx: Oropharynx is clear.  Cardiovascular:     Rate and Rhythm: Normal rate  and regular rhythm.  Pulmonary:     Effort: Pulmonary effort is normal.     Breath sounds: Normal breath sounds.  Musculoskeletal:     Right wrist: Bony tenderness present. Decreased range of motion.     Left wrist: Normal.     Right hand: Bony tenderness present.     Cervical back: Normal range of motion and neck supple.  Skin:    General: Skin is warm and dry.  Neurological:     Mental Status: He is alert and oriented to  person, place, and time.  Psychiatric:        Mood and Affect: Mood normal.        Thought Content: Thought content normal.     No results found for any visits on 01/02/24.    The ASCVD Risk score (Arnett DK, et al., 2019) failed to calculate for the following reasons:   The valid total cholesterol range is 130 to 320 mg/dL    Assessment & Plan:   Problem List Items Addressed This Visit       Cardiovascular and Mediastinum   Hypertension - Primary   Blood pressure variable with morning elevations. Potassium levels previously high, affecting medication regimen. - Refilled hydrochlorothiazide  prescription. - Continue lisinopril  40 mg and metoprolol .      Relevant Medications   hydrochlorothiazide  (MICROZIDE ) 12.5 MG capsule   Other Relevant Orders   CMP14+EGFR     Other   Hyperlipidemia with target LDL less than 70   Adequately controlled with atorvastatin  80 mg daily and Zetia  10 mg daily.  No medication changes are indicated today.  Recheck lipid panel today.      Relevant Medications   hydrochlorothiazide  (MICROZIDE ) 12.5 MG capsule   Other Relevant Orders   CMP14+EGFR   Lipid panel   TSH + free T4   Prediabetes   A1c 5.9 on labs from May.  Repeat A1c today       Relevant Orders   CMP14+EGFR   Hemoglobin A1c   Generalized anxiety disorder   Remains adequately controlled with clonazepam  1 mg daily as needed for anxiety relief.  PDMP reviewed and is appropriate.      Relevant Medications   clonazePAM  (KLONOPIN ) 1 MG tablet   Obesity (BMI 30.0-34.9)   Weight-related hypertension. Regular exercise without significant weight loss. Late-night eating may contribute to weight issues. - Encouraged continued exercise and dietary modifications to support weight loss.      Relevant Orders   TSH + free T4   Chronic pain of right wrist   Numbness and pain in fingers with arthritis and trigger finger. Possible nerve compression, not carpal tunnel syndrome. Previous  positive outcomes with physical therapy and dry needling. - Referred to physical therapy for evaluation and management of wrist and hand symptoms.      Relevant Medications   clonazePAM  (KLONOPIN ) 1 MG tablet   Other Relevant Orders   Ambulatory referral to Physical Therapy   Other Visit Diagnoses       Prostate cancer screening       Family history of prostate cancer. Regular PSA screenings every six months. - Ordered PSA test.   Relevant Orders   PSA     The natural history of prostate cancer and ongoing controversy regarding screening and potential treatment outcomes of prostate cancer has been discussed with the patient. The meaning of a false positive PSA and a false negative PSA has been discussed. He indicates understanding of the limitations of this screening  test and wishes to proceed with screening PSA testing.   No follow-ups on file.    Leita Longs, FNP

## 2024-01-02 NOTE — Telephone Encounter (Signed)
 I reviewed with patient  I would agree with starting hydrochlorothiazide  again   12.5 mg  Set pt up for BMET in 7 to 10 days at [facility]

## 2024-01-03 DIAGNOSIS — E66811 Obesity, class 1: Secondary | ICD-10-CM | POA: Insufficient documentation

## 2024-01-03 DIAGNOSIS — G8929 Other chronic pain: Secondary | ICD-10-CM | POA: Insufficient documentation

## 2024-01-03 NOTE — Assessment & Plan Note (Signed)
 Numbness and pain in fingers with arthritis and trigger finger. Possible nerve compression, not carpal tunnel syndrome. Previous positive outcomes with physical therapy and dry needling. - Referred to physical therapy for evaluation and management of wrist and hand symptoms.

## 2024-01-03 NOTE — Assessment & Plan Note (Signed)
 Blood pressure variable with morning elevations. Potassium levels previously high, affecting medication regimen. - Refilled hydrochlorothiazide  prescription. - Continue lisinopril  40 mg and metoprolol .

## 2024-01-03 NOTE — Assessment & Plan Note (Signed)
 Weight-related hypertension. Regular exercise without significant weight loss. Late-night eating may contribute to weight issues. - Encouraged continued exercise and dietary modifications to support weight loss.

## 2024-01-03 NOTE — Assessment & Plan Note (Signed)
 A1c 5.9 on labs from May.  Repeat A1c today

## 2024-01-03 NOTE — Assessment & Plan Note (Signed)
 Adequately controlled with atorvastatin  80 mg daily and Zetia  10 mg daily.  No medication changes are indicated today.  Recheck lipid panel today.

## 2024-01-03 NOTE — Assessment & Plan Note (Signed)
 Remains adequately controlled with clonazepam  1 mg daily as needed for anxiety relief.  PDMP reviewed and is appropriate.

## 2024-01-04 ENCOUNTER — Other Ambulatory Visit: Payer: Self-pay | Admitting: Internal Medicine

## 2024-01-04 DIAGNOSIS — F10982 Alcohol use, unspecified with alcohol-induced sleep disorder: Secondary | ICD-10-CM

## 2024-01-04 LAB — CMP14+EGFR
ALT: 41 IU/L (ref 0–44)
AST: 26 IU/L (ref 0–40)
Albumin: 4.4 g/dL (ref 3.9–4.9)
Alkaline Phosphatase: 76 IU/L (ref 47–123)
BUN/Creatinine Ratio: 17 (ref 10–24)
BUN: 17 mg/dL (ref 8–27)
Bilirubin Total: 0.4 mg/dL (ref 0.0–1.2)
CO2: 24 mmol/L (ref 20–29)
Calcium: 9.4 mg/dL (ref 8.6–10.2)
Chloride: 104 mmol/L (ref 96–106)
Creatinine, Ser: 1 mg/dL (ref 0.76–1.27)
Globulin, Total: 2.1 g/dL (ref 1.5–4.5)
Glucose: 110 mg/dL — ABNORMAL HIGH (ref 70–99)
Potassium: 5.2 mmol/L (ref 3.5–5.2)
Sodium: 140 mmol/L (ref 134–144)
Total Protein: 6.5 g/dL (ref 6.0–8.5)
eGFR: 81 mL/min/1.73 (ref >59)

## 2024-01-04 LAB — LIPID PANEL
Chol/HDL Ratio: 3.7 ratio (ref 0.0–5.0)
Cholesterol, Total: 106 mg/dL (ref 100–199)
HDL: 29 mg/dL — ABNORMAL LOW (ref >39)
LDL Chol Calc (NIH): 58 mg/dL (ref 0–99)
Triglycerides: 101 mg/dL (ref 0–149)
VLDL Cholesterol Cal: 19 mg/dL (ref 5–40)

## 2024-01-04 LAB — PSA: Prostate Specific Ag, Serum: 0.4 ng/mL (ref 0.0–4.0)

## 2024-01-04 LAB — HEMOGLOBIN A1C
Est. average glucose Bld gHb Est-mCnc: 120 mg/dL
Hgb A1c MFr Bld: 5.8 % — ABNORMAL HIGH (ref 4.8–5.6)

## 2024-01-04 LAB — TSH+FREE T4
Free T4: 0.95 ng/dL (ref 0.82–1.77)
TSH: 1.77 u[IU]/mL (ref 0.450–4.500)

## 2024-01-11 DIAGNOSIS — I10 Essential (primary) hypertension: Secondary | ICD-10-CM | POA: Diagnosis not present

## 2024-01-11 DIAGNOSIS — I2510 Atherosclerotic heart disease of native coronary artery without angina pectoris: Secondary | ICD-10-CM | POA: Diagnosis not present

## 2024-01-12 LAB — BASIC METABOLIC PANEL WITH GFR
BUN/Creatinine Ratio: 22 (calc) (ref 6–22)
BUN: 31 mg/dL — ABNORMAL HIGH (ref 7–25)
CO2: 21 mmol/L (ref 20–32)
Calcium: 9.4 mg/dL (ref 8.6–10.3)
Chloride: 106 mmol/L (ref 98–110)
Creat: 1.42 mg/dL — ABNORMAL HIGH (ref 0.70–1.28)
Glucose, Bld: 141 mg/dL — ABNORMAL HIGH (ref 65–139)
Potassium: 4.6 mmol/L (ref 3.5–5.3)
Sodium: 138 mmol/L (ref 135–146)
eGFR: 53 mL/min/1.73m2 — ABNORMAL LOW (ref >=60)

## 2024-01-24 DIAGNOSIS — M25532 Pain in left wrist: Secondary | ICD-10-CM | POA: Diagnosis not present

## 2024-01-24 DIAGNOSIS — M25531 Pain in right wrist: Secondary | ICD-10-CM | POA: Diagnosis not present

## 2024-02-01 DIAGNOSIS — M25531 Pain in right wrist: Secondary | ICD-10-CM | POA: Diagnosis not present

## 2024-02-01 DIAGNOSIS — M25532 Pain in left wrist: Secondary | ICD-10-CM | POA: Diagnosis not present

## 2024-02-06 ENCOUNTER — Other Ambulatory Visit: Payer: Self-pay | Admitting: Physician Assistant

## 2024-02-07 DIAGNOSIS — M25531 Pain in right wrist: Secondary | ICD-10-CM | POA: Diagnosis not present

## 2024-02-07 DIAGNOSIS — M25532 Pain in left wrist: Secondary | ICD-10-CM | POA: Diagnosis not present

## 2024-02-21 ENCOUNTER — Encounter: Payer: Self-pay | Admitting: Physician Assistant

## 2024-02-21 ENCOUNTER — Ambulatory Visit: Admitting: Physician Assistant

## 2024-02-21 VITALS — BP 154/92

## 2024-02-21 DIAGNOSIS — L409 Psoriasis, unspecified: Secondary | ICD-10-CM | POA: Diagnosis not present

## 2024-02-21 DIAGNOSIS — L578 Other skin changes due to chronic exposure to nonionizing radiation: Secondary | ICD-10-CM

## 2024-02-21 DIAGNOSIS — L918 Other hypertrophic disorders of the skin: Secondary | ICD-10-CM | POA: Diagnosis not present

## 2024-02-21 DIAGNOSIS — D485 Neoplasm of uncertain behavior of skin: Secondary | ICD-10-CM

## 2024-02-21 DIAGNOSIS — L821 Other seborrheic keratosis: Secondary | ICD-10-CM

## 2024-02-21 DIAGNOSIS — W908XXA Exposure to other nonionizing radiation, initial encounter: Secondary | ICD-10-CM

## 2024-02-21 DIAGNOSIS — D1801 Hemangioma of skin and subcutaneous tissue: Secondary | ICD-10-CM

## 2024-02-21 DIAGNOSIS — L814 Other melanin hyperpigmentation: Secondary | ICD-10-CM

## 2024-02-21 DIAGNOSIS — D225 Melanocytic nevi of trunk: Secondary | ICD-10-CM | POA: Diagnosis not present

## 2024-02-21 DIAGNOSIS — L570 Actinic keratosis: Secondary | ICD-10-CM | POA: Diagnosis not present

## 2024-02-21 DIAGNOSIS — D229 Melanocytic nevi, unspecified: Secondary | ICD-10-CM

## 2024-02-21 DIAGNOSIS — Z1283 Encounter for screening for malignant neoplasm of skin: Secondary | ICD-10-CM | POA: Diagnosis not present

## 2024-02-21 MED ORDER — CLOBETASOL PROPIONATE 0.05 % EX SOLN: 1.0000 | Freq: Two times a day (BID) | CUTANEOUS | 3 refills | Status: AC

## 2024-02-21 MED ORDER — KETOCONAZOLE 2 % EX SHAM: 1.0000 | MEDICATED_SHAMPOO | CUTANEOUS | 6 refills | Status: AC

## 2024-02-21 NOTE — Patient Instructions (Addendum)
 Other Procedure(Skin Tags): Quote for cosmetic removal:  $200 to remove Up to 15 lesions $300 to remove  16 to 25 $400 to removal  26-35 $10 per tag for each additional   Cryotherapy Aftercare  Wash gently with soap and water everyday.   Apply Vaseline and Band-Aid daily until healed.     Patient Handout: Wound Care for Skin Biopsy Site  Taking Care of Your Skin Biopsy Site  Proper care of the biopsy site is essential for promoting healing and minimizing scarring. This handout provides instructions on how to care for your biopsy site to ensure optimal recovery.  1. Cleaning the Wound:  Clean the biopsy site daily with gentle soap and water. Gently pat the area dry with a clean, soft towel. Avoid harsh scrubbing or rubbing the area, as this can irritate the skin and delay healing.  2. Applying Aquaphor and Bandage:  After cleaning the wound, apply a thin layer of Aquaphor ointment to the biopsy site. Cover the area with a sterile bandage to protect it from dirt, bacteria, and friction. Change the bandage daily or as needed if it becomes soiled or wet.  3. Continued Care for One Week:  Repeat the cleaning, Aquaphor application, and bandaging process daily for one week following the biopsy procedure. Keeping the wound clean and moist during this initial healing period will help prevent infection and promote optimal healing.  4. Massaging Aquaphor into the Area:  ---After one week, discontinue the use of bandages but continue to apply Aquaphor to the biopsy site. ----Gently massage the Aquaphor into the area using circular motions. ---Massaging the skin helps to promote circulation and prevent the formation of scar tissue.   Additional Tips:  Avoid exposing the biopsy site to direct sunlight during the healing process, as this can cause hyperpigmentation or worsen scarring. If you experience any signs of infection, such as increased redness, swelling, warmth, or drainage  from the wound, contact your healthcare provider immediately. Follow any additional instructions provided by your healthcare provider for caring for the biopsy site and managing any discomfort. Conclusion:  Taking proper care of your skin biopsy site is crucial for ensuring optimal healing and minimizing scarring. By following these instructions for cleaning, applying Aquaphor, and massaging the area, you can promote a smooth and successful recovery. If you have any questions or concerns about caring for your biopsy site, don't hesitate to contact your healthcare provider for guidance.    Important Information  Due to recent changes in healthcare laws, you may see results of your pathology and/or laboratory studies on MyChart before the doctors have had a chance to review them. We understand that in some cases there may be results that are confusing or concerning to you. Please understand that not all results are received at the same time and often the doctors may need to interpret multiple results in order to provide you with the best plan of care or course of treatment. Therefore, we ask that you please give us  2 business days to thoroughly review all your results before contacting the office for clarification. Should we see a critical lab result, you will be contacted sooner.   If You Need Anything After Your Visit  If you have any questions or concerns for your doctor, please call our main line at [phone] If no one answers, please leave a voicemail as directed and we will return your call as soon as possible. Messages left after 4 pm will be answered the following business  day.   You may also send us  a message via MyChart. We typically respond to MyChart messages within 1-2 business days.  For prescription refills, please ask your pharmacy to contact our office. Our fax number is [phone].  If you have an urgent issue when the clinic is closed that cannot wait until the next business  day, you can page your doctor at the number below.    Please note that while we do our best to be available for urgent issues outside of office hours, we are not available 24/7.   If you have an urgent issue and are unable to reach us , you may choose to seek medical care at your doctor's office, retail clinic, urgent care center, or emergency room.  If you have a medical emergency, please immediately call 911 or go to the emergency department. In the event of inclement weather, please call our main line at [phone] for an update on the status of any delays or closures.  Dermatology Medication Tips: Please keep the boxes that topical medications come in in order to help keep track of the instructions about where and how to use these. Pharmacies typically print the medication instructions only on the boxes and not directly on the medication tubes.   If your medication is too expensive, please contact our office at [phone] or send us  a message through MyChart.   We are unable to tell what your co-pay for medications will be in advance as this is different depending on your insurance coverage. However, we may be able to find a substitute medication at lower cost or fill out paperwork to get insurance to cover a needed medication.   If a prior authorization is required to get your medication covered by your insurance company, please allow us  1-2 business days to complete this process.  Drug prices often vary depending on where the prescription is filled and some pharmacies may offer cheaper prices.  The website www.goodrx.com contains coupons for medications through different pharmacies. The prices here do not account for what the cost may be with help from insurance (it may be cheaper with your insurance), but the website can give you the price if you did not use any insurance.  - You can print the associated coupon and take it with your prescription to the pharmacy.  - You may also stop  by our office during regular business hours and pick up a GoodRx coupon card.  - If you need your prescription sent electronically to a different pharmacy, notify our office through [facility] or by phone at [phone]

## 2024-02-21 NOTE — Progress Notes (Signed)
 "  New Patient Visit   Subjective  Roger Powers is a 69 y.o. male NEW PATIENT who presents for the following: Skin Cancer Screening and Full Body Skin Exam - No history of skin cancer. He does have a history of psoriasis and he uses Clobetasol  solution. He had a cream for his skin but does not remember the name.  The patient presents for Total-Body Skin Exam (TBSE) for skin cancer screening and mole check. The patient has spots, moles and lesions to be evaluated, some may be new or changing and the patient may have concern these could be cancer.    The following portions of the chart were reviewed this encounter and updated as appropriate: medications, allergies, medical history  Review of Systems:  No other skin or systemic complaints except as noted in HPI or Assessment and Plan.  Objective  Well appearing patient in no apparent distress; mood and affect are within normal limits.  A full examination was performed including scalp, head, eyes, ears, nose, lips, neck, chest, axillae, abdomen, back, buttocks, bilateral upper extremities, bilateral lower extremities, hands, feet, fingers, toes, fingernails, and toenails. All findings within normal limits unless otherwise noted below.   Relevant physical exam findings are noted in the Assessment and Plan.  Left preauricular area Erythematous thin papules/macules with gritty scale.  Right mid abdomen 0.8 cm irregular brown macule     Assessment & Plan   SKIN CANCER SCREENING PERFORMED TODAY.  ACTINIC DAMAGE - Chronic condition, secondary to cumulative UV/sun exposure - diffuse scaly erythematous macules with underlying dyspigmentation - Recommend daily broad spectrum sunscreen SPF 30+ to sun-exposed areas, reapply every 2 hours as needed.  - Staying in the shade or wearing long sleeves, sun glasses (UVA+UVB protection) and wide brim hats (4-inch brim around the entire circumference of the hat) are also recommended for sun protection.   - Call for new or changing lesions.  LENTIGINES, SEBORRHEIC KERATOSES, HEMANGIOMAS - Benign normal skin lesions - Benign-appearing - Call for any changes  MELANOCYTIC NEVI - Tan-brown and/or pink-flesh-colored symmetric macules and papules - Benign appearing on exam today - Observation - Call clinic for new or changing moles - Recommend daily use of broad spectrum spf 30+ sunscreen to sun-exposed areas.    Acrochordons (Skin Tags) - Fleshy, skin-colored pedunculated papules - Benign appearing.  - Observe. - If desired, they can be removed with an in office procedure that is not covered by insurance. - Please call the clinic if you notice any new or changing lesions.  Other Procedure(Skin Tags): Quote for cosmetic removal:  $200 to remove Up to 15 lesions $300 to remove  16 to 25 $400 to removal  26-35 $10 per tag for each additional    PSORIASIS OF SCALP  Psoriasis is a chronic non-curable, but treatable genetic/hereditary disease that may have other systemic features affecting other organ systems such as joints (Psoriatic Arthritis). It is associated with an increased risk of inflammatory bowel disease, heart disease, non-alcoholic fatty liver disease, and depression.  Treatments include light and laser treatments; topical medications; and systemic medications including oral and injectables.  Treatment Plan: Clobetasol  solution Apply to scalp twice daily as needed.  Ketoconazole  2% shampoo Wash scalp as needed  AK (ACTINIC KERATOSIS) Left preauricular area - Destruction of lesion - Left preauricular area Complexity: simple   Destruction method: cryotherapy   Informed consent: discussed and consent obtained   Timeout:  patient name, date of birth, surgical site, and procedure verified Lesion destroyed using  liquid nitrogen: Yes   Region frozen until ice ball extended beyond lesion: Yes   Outcome: patient tolerated procedure well with no complications   Post-procedure  details: wound care instructions given    NEOPLASM OF UNCERTAIN BEHAVIOR OF SKIN Right mid abdomen - Skin / nail biopsy Type of biopsy: tangential   Informed consent: discussed and consent obtained   Timeout: patient name, date of birth, surgical site, and procedure verified   Procedure prep:  Patient was prepped and draped in usual sterile fashion Prep type:  Isopropyl alcohol Anesthesia: the lesion was anesthetized in a standard fashion   Anesthetic:  1% lidocaine  w/ epinephrine 1-100,000 buffered w/ 8.4% NaHCO3 Instrument used: flexible razor blade   Hemostasis achieved with: pressure, aluminum chloride and electrodesiccation   Outcome: patient tolerated procedure well   Post-procedure details: sterile dressing applied and wound care instructions given   Dressing type: bandage and petrolatum    Specimen 1 - Surgical pathology Differential Diagnosis: Nevus vs dysplastic nevus R/O MM  Check Margins: No LENTIGINES   MULTIPLE BENIGN NEVI   SEBORRHEIC KERATOSIS   CHERRY ANGIOMA   ACTINIC SKIN DAMAGE   SCREENING EXAM FOR SKIN CANCER   PSORIASIS   ACROCHORDON    Return in about 1 year (around 02/20/2025) for TBSE.  I, Roger Powers, CMA, am acting as scribe for Roger Blandon K, PA-C .   Documentation: I have reviewed the above documentation for accuracy and completeness, and I agree with the above.  Shon Mansouri K, PA-C    "

## 2024-02-22 LAB — SURGICAL PATHOLOGY

## 2024-02-26 ENCOUNTER — Ambulatory Visit: Payer: Self-pay | Admitting: Physician Assistant

## 2024-03-12 NOTE — Progress Notes (Unsigned)
 "   Cardiology Office Note   Date:  03/15/2024   ID:  Roger Powers, Roger Powers December 13, 1955, MRN [id number]  PCP:  Bevely Doffing, FNP  Cardiologist:   Vina Gull, MD   Pt returns for f/u of CAD      History of Present Illness: Roger Powers is a 69 y.o. male with a history of CAD    2018  Ca score  was 339   Aorta 43 mm     2021  Stress myoview   showed no schemia Dec 2023  Coronary CTA showed signif stensosis in LAD   REcomm LHC which showed 75% LAD stenosis in mid LAD   Underwent PCI/DES to mid LAD    Plan for ASA / Plavix  for at least 6 months   I saw the pt in fall 2024   Nov 2024  The pt went into bedroom to get ready for bath   Wife found him slumped in chair  She says he was pounding on chest    Pt does not remember anything    Wife says he was unresponsive for a bit   Then wokw up     He was seen in cardiology by [name]   Zio patch showed no arrhythmia Myoview  scan showed question basal inferolateral ischemia  This did not appear signficant   Seen in neuro   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   clobetasol  (TEMOVATE ) 0.05 % external solution Apply 1 Application topically daily as needed (psoriasis).   clobetasol  (TEMOVATE ) 0.05 % external solution Apply 1 Application topically 2 (two) times daily.   clonazePAM  (KLONOPIN ) 1 MG tablet Take 1 tablet (1 mg total) by mouth daily.   clopidogrel  (PLAVIX ) 75 MG tablet TAKE ONE TABLET (75MG  TOTAL) BY MOUTH DAILY WITH BREAKFAST   ezetimibe  (ZETIA ) 10 MG tablet TAKE ONE TABLET (10MG   TOTAL) BY MOUTH DAILY   hydrochlorothiazide  (MICROZIDE ) 12.5 MG capsule Take 1 capsule (12.5 mg total) by mouth daily.   ketoconazole  (NIZORAL ) 2 % shampoo Apply 1 Application topically as directed. Wash scalp as needed   lisinopril  (ZESTRIL ) 40 MG tablet TAKE ONE TABLET (40MG  TOTAL) BY MOUTH DAILY   metoprolol  succinate (TOPROL -XL) 25 MG 24 hr tablet TAKE ONE TABLET (25MG  TOTAL) BY MOUTH DAILY   Multiple Vitamins-Minerals (MULTIVITAMIN MEN 50+ PO) Take 1 tablet by mouth daily.   nitroGLYCERIN  (NITROSTAT ) 0.4 MG SL tablet Place 1 tablet (0.4 mg total) under the tongue every 5 (five) minutes as needed for chest pain.   pantoprazole  (PROTONIX ) 40 MG tablet TAKE ONE TABLET BY MOUTH ONCE A DAY AS NEEDED FOR ACID REFLUX   tadalafil  (CIALIS ) 20 MG tablet Take 1 tablet (20 mg total) by mouth daily as needed for erectile dysfunction. [place] 60 minutes prior to intercourse   zolpidem  (AMBIEN ) 10 MG tablet TAKE ONE TABLET BY MOUTH EACH NIGHT AT BEDTIME FOR SLEEP  Allergies:   Patient has no known allergies.   Past Medical History:  Diagnosis Date   CAD (coronary artery disease)    02/12/22: LHC DES 2.50x16 to midLAD, residual dz tx medically   Cataract    RIGHT eye only   GERD (gastroesophageal reflux disease)    hx of- with certain foods- on meds   Hypercholesteremia    on meds   Hyperkalemia    Hypertension    on meds   Psoriasis     Past Surgical History:  Procedure Laterality Date   CATARACT EXTRACTION W/ INTRAOCULAR LENS IMPLANT Right 2013   COLONOSCOPY  2004   patient cannot remember where/MD   CORONARY STENT INTERVENTION N/A 02/12/2022   Procedure: CORONARY STENT INTERVENTION;  Surgeon: Dann Candyce RAMAN, MD;  Location: [facility] INVASIVE CV LAB;  Service: Cardiovascular;  Laterality: N/A;   LEFT HEART CATH AND CORONARY ANGIOGRAPHY N/A 02/12/2022   Procedure: LEFT HEART CATH AND CORONARY ANGIOGRAPHY;  Surgeon: Dann Candyce RAMAN, MD;  Location: [facility]ASIVE CV LAB;  Service:  Cardiovascular;  Laterality: N/A;   RETINAL DETACHMENT SURGERY Right 2012   WISDOM TOOTH EXTRACTION       Social History:  The patient  reports that he quit smoking about 44 years ago. His smoking use included cigarettes. He has quit using smokeless tobacco. He reports current alcohol use of about 2.0 standard drinks of alcohol per week. He reports that he does not use drugs.   Family History:  The patient's family history includes Breast cancer in his mother; Diabetes in his mother; Heart disease in his brother, brother, and father; Lung cancer in his mother.    ROS:  Please see the history of present illness. All other systems are reviewed and  Negative to the above problem except as noted.    PHYSICAL EXAM: VS:  BP (!) 154/81 (BP Location: Left Arm, Patient Position: Sitting)   Pulse 65   Ht 5' 11 (1.803 m)   Wt 237 lb (107.5 kg)   SpO2 95%   BMI 33.05 kg/m   GEN: Overweight 69 yo  in no acute distress  HEENT: normal  Neck: JVP normal;  Cardiac: RRR; No murmurs   No LE  edema  Respiratory:  clear to auscultation  GI: soft, nontender, nondistended   No hepatomegaly    EKG:  EKG not done   [facility] 2024  Myoview      Findings are consistent with ischemia. The study is low risk.   No ST deviation was noted. The ECG was negative for ischemia.   LV perfusion is abnormal.  Small, mild intensity, mid to basal inferolateral defect that exhibits partial reversibility mainly at the base.  Although diaphragmatic attenuation is present and may also be contributing, this is suggestive of mild ischemia.   Left ventricular function is normal. Nuclear stress EF: 58%.  Echo  Jan 2024  1. Left ventricular ejection fraction, by estimation, is 65 to 70%. The  left ventricle has normal function. The left ventricle has no regional  wall motion abnormalities. There is moderate asymmetric left ventricular  hypertrophy of the basal-septal  segment. Left ventricular diastolic parameters are  indeterminate. Elevated  left atrial pressure. The average left ventricular global longitudinal  strain is -19.9 %. The global longitudinal strain is normal.   2. Right ventricular systolic function is normal. The right ventricular  size is normal.   3. The mitral valve is normal in structure. No evidence of mitral valve  regurgitation. No evidence  of mitral stenosis.   4. The aortic valve is tricuspid. There is mild calcification of the  aortic valve. There is mild thickening of the aortic valve. Aortic valve  regurgitation is not visualized. No aortic stenosis is present.   5. The inferior vena cava is normal in size with greater than 50%  respiratory variability, suggesting right atrial pressure of 3 mmHg.    LHC   Dec 2024    Prox RCA lesion is 25% stenosed.   RPAV lesion is 50% stenosed.   Mid LAD-1 lesion is 25% stenosed.   Mid LAD-2 lesion is 40% stenosed.   Mid LAD-3 lesion is 75% stenosed.  A drug-eluting stent was successfully placed using a SYNERGY XD 2.50X16, postdilated to greater than 2.75 mm.   Post intervention, there is a 0% residual stenosis.   The left ventricular systolic function is normal.   LV end diastolic pressure is normal.  LVEDP 8 mmHg.   The left ventricular ejection fraction is 55-65% by visual estimate.   There is no aortic valve stenosis.   There was tortuosity in the right subclavian making catheter manipulation somewhat difficult.  There was right radial spasm.   Successful PCI of the mid LAD.  Mild to moderate disease elsewhere in the coronary tree.  Continue aggressive preventive therapy along with dual antiplatelet therapy for at least 6 months.  After 6 months, would consider clopidogrel  monotherapy going forward given his diffuse atherosclerosis and calcification.  CT Dec 2024   Coronary calcium  score is 847, which places the patient in the 85th percentile for age and sex matched control.   Coronary arteries: Normal coronary origins.  Right  dominance.   Right Coronary Artery: Severe mixed atherosclerotic plaque in the proximal RCA, 70-99% stenosis. Mild mixed atherosclerotic plaque in the mid RCA with low attenuation plaque, 25-49% stenosis. Mild scattered plaque in the distal RCA, PDA and PL, 25-49% stenosis.   Left Main Coronary Artery: Minimal distal LM mixed atherosclerotic plaque, <25% stenosis.   Left Anterior Descending Coronary Artery: Mild mixed atherosclerotic plaque in the proximal LAD, 25-49% stenosis. Moderate mixed atherosclerotic plaque in the mid LAD at the level of the D1 bifurcation, 50-69% stenosis. Minimal ostial plaque in D1, <25% stenosis. Severe mixed atherosclerotic plaque in the mid LAD just distal to the D1 takeoff, 70-99% stenosis, and a serial severe stenosis 70-99% stenosis in the mid LAD. Distal vessel has mild scattered disease.   Ramus intermedius: Mild mixed atherosclerotic plaque in the mid RI, 25-49%.   Left Circumflex Artery: Mild mixed atherosclerotic plaque in the proximal LCx, 25-49%.   Aorta: Mild dilation, 42 x 40 x 39 mm at sinus of Valsalva, 40 mm at the mid ascending aorta (level of the PA bifurcation) measured double oblique.   Aortic Valve: No calcifications.   Other findings:   Normal pulmonary vein drainage into the left atrium.   Normal left atrial appendage without thrombus.   Mild dilation of main and branch pulmonary arteries   Severe basal septal hypertrophy, 16-17 mm.   Please see separate report from [facility] for non-cardiac findings.   IMPRESSION: 1. Severe CAD in the proximal RCA and mid LAD, 70-99% stenosis, CADRADS 4. CT FFR will be performed and reported separately.   2. Coronary calcium  score is 847, which places the patient in the 85th percentile for age and sex matched control.   3. Normal coronary origins with right dominance.   4. Severe basal septal hypertrophy, 16-17 mm.   5. Mild  dilation of main and branch pulmonary  arteries, which may indicate increased pulmonary pressures.   6. Mild dilation of ascending aorta, 42 mm at sinus of Valsalva, 40 mm at mid ascending aorta.   Lipid Panel    Component Value Date/Time   CHOL 106 01/03/2024 0807   TRIG 101 01/03/2024 0807   HDL 29 (L) 01/03/2024 0807   CHOLHDL 3.7 01/03/2024 0807   CHOLHDL 3.8 05/12/2022 0707   VLDL 37 (H) 09/29/2016 0737   LDLCALC 58 01/03/2024 0807   LDLCALC 41 05/12/2022 0707      Wt Readings from Last 3 Encounters:  03/15/24 237 lb (107.5 kg)  01/02/24 237 lb (107.5 kg)  08/11/23 236 lb (107 kg)      ASSESSMENT AND PLAN:  1  CAD  Pt s/p intervention to LAD in 2023   Myoview  in 2024 without ischemia     Denies symptoms concerning for angina  Follow   2 HTN   BP is high today  Better at home    I agrre with plan for diet   If weight comes down BP should improve  3  Hx syncope   2024   Work up only with CT findings remote lacune     No recurrence    4  HL  LDL 58  HDL 29 Trgi 101 in Nov 2025  Keep on current meds   5  Metabolic   A1C 5.8  Pt to make diet changes    With CAD he would be a candidate for GLP1 agonist   He will consider   6  Aortic dilitation  Aorta 4 x 4  on CT in 2023   Mildly diltated  Follow periodically     Current medicines are reviewed at length with the patient today.  The patient does not have concerns regarding medicines.  Signed, Vina Gull, MD  "

## 2024-03-15 ENCOUNTER — Encounter: Payer: Self-pay | Admitting: Internal Medicine

## 2024-03-15 ENCOUNTER — Ambulatory Visit: Attending: Internal Medicine | Admitting: Internal Medicine

## 2024-03-15 VITALS — BP 154/81 | HR 65 | Ht 71.0 in | Wt 237.0 lb

## 2024-03-15 DIAGNOSIS — I10 Essential (primary) hypertension: Secondary | ICD-10-CM | POA: Diagnosis not present

## 2024-03-15 NOTE — Patient Instructions (Addendum)
 Medication Instructions:  Continue same medications *If you need a refill on your cardiac medications before your next appointment, please call your pharmacy*  Lab Work: None ordered  Testing/Procedures: None ordered  Follow-Up: At [facility], you and your health needs are our priority.  As part of our continuing mission to provide you with exceptional heart care, our providers are all part of one team.  This team includes your primary Cardiologist (physician) and Advanced Practice Providers or APPs (Physician Assistants and Nurse Practitioners) who all work together to provide you with the care you need, when you need it.  Your next appointment:  Early August      Call in June to schedule     Provider:  Dr.Ross    We recommend signing up for the patient portal called MyChart.  Sign up information is provided on this After Visit Summary.  MyChart is used to connect with patients for Virtual Visits (Telemedicine).  Patients are able to view lab/test results, encounter notes, upcoming appointments, etc.  Non-urgent messages can be sent to your provider as well.   To learn more about what you can do with MyChart, go to [email or web address].

## 2024-03-22 ENCOUNTER — Other Ambulatory Visit: Payer: Self-pay

## 2024-03-22 ENCOUNTER — Telehealth: Payer: Self-pay

## 2024-03-22 DIAGNOSIS — G8929 Other chronic pain: Secondary | ICD-10-CM

## 2024-03-22 NOTE — Telephone Encounter (Signed)
 Referral placed for Dr. Gramig

## 2024-03-22 NOTE — Telephone Encounter (Signed)
 Patient wanting referral to hand surgeons says he's spoke about this with [facility] before and his PT gave him recommendations Franky Curia [phone] Elsie Mussel [phone] [facility] call with any questions

## 2024-04-16 ENCOUNTER — Ambulatory Visit: Payer: Medicare HMO

## 2024-07-10 ENCOUNTER — Ambulatory Visit

## 2024-08-23 ENCOUNTER — Other Ambulatory Visit

## 2024-08-29 ENCOUNTER — Ambulatory Visit: Admitting: Urology

## 2025-01-09 ENCOUNTER — Encounter

## 2025-02-26 ENCOUNTER — Ambulatory Visit: Admitting: Physician Assistant
